# Patient Record
Sex: Female | Born: 1965 | Race: Black or African American | Hispanic: No | Smoking: Never smoker
Health system: Southern US, Community
[De-identification: ages and names within clinical notes are randomized; demographics above are authoritative.]

## PROBLEM LIST (undated history)

## (undated) DIAGNOSIS — R519 Headache, unspecified: Secondary | ICD-10-CM

## (undated) DIAGNOSIS — G43909 Migraine, unspecified, not intractable, without status migrainosus: Secondary | ICD-10-CM

## (undated) DIAGNOSIS — Z923 Personal history of irradiation: Secondary | ICD-10-CM

## (undated) DIAGNOSIS — Z803 Family history of malignant neoplasm of breast: Secondary | ICD-10-CM

## (undated) DIAGNOSIS — N809 Endometriosis, unspecified: Secondary | ICD-10-CM

## (undated) DIAGNOSIS — R51 Headache: Secondary | ICD-10-CM

## (undated) DIAGNOSIS — Z9221 Personal history of antineoplastic chemotherapy: Secondary | ICD-10-CM

## (undated) DIAGNOSIS — Z8042 Family history of malignant neoplasm of prostate: Secondary | ICD-10-CM

## (undated) DIAGNOSIS — C801 Malignant (primary) neoplasm, unspecified: Secondary | ICD-10-CM

## (undated) DIAGNOSIS — K5909 Other constipation: Secondary | ICD-10-CM

## (undated) HISTORY — PX: TONSILLECTOMY: SUR1361

## (undated) HISTORY — DX: Other constipation: K59.09

## (undated) HISTORY — DX: Endometriosis, unspecified: N80.9

## (undated) HISTORY — DX: Migraine, unspecified, not intractable, without status migrainosus: G43.909

## (undated) HISTORY — DX: Family history of malignant neoplasm of breast: Z80.3

## (undated) HISTORY — DX: Family history of malignant neoplasm of prostate: Z80.42

## (undated) HISTORY — PX: LAPAROSCOPIC SALPINGO OOPHERECTOMY: SHX5927

---

## 1997-12-21 ENCOUNTER — Other Ambulatory Visit: Admission: RE | Admit: 1997-12-21 | Discharge: 1997-12-21 | Payer: Self-pay | Admitting: Internal Medicine

## 1999-08-23 ENCOUNTER — Encounter: Payer: Self-pay | Admitting: Obstetrics and Gynecology

## 1999-08-23 ENCOUNTER — Ambulatory Visit (HOSPITAL_COMMUNITY): Admission: RE | Admit: 1999-08-23 | Discharge: 1999-08-23 | Payer: Self-pay | Admitting: Obstetrics and Gynecology

## 2001-09-01 ENCOUNTER — Encounter: Payer: Self-pay | Admitting: Obstetrics and Gynecology

## 2001-09-01 ENCOUNTER — Ambulatory Visit (HOSPITAL_COMMUNITY): Admission: RE | Admit: 2001-09-01 | Discharge: 2001-09-01 | Payer: Self-pay | Admitting: Obstetrics and Gynecology

## 2001-10-07 ENCOUNTER — Encounter: Payer: Self-pay | Admitting: Obstetrics and Gynecology

## 2001-10-07 ENCOUNTER — Observation Stay (HOSPITAL_COMMUNITY): Admission: RE | Admit: 2001-10-07 | Discharge: 2001-10-08 | Payer: Self-pay | Admitting: Obstetrics and Gynecology

## 2001-11-24 ENCOUNTER — Encounter: Payer: Self-pay | Admitting: Obstetrics and Gynecology

## 2001-11-24 ENCOUNTER — Ambulatory Visit (HOSPITAL_COMMUNITY): Admission: RE | Admit: 2001-11-24 | Discharge: 2001-11-24 | Payer: Self-pay | Admitting: Obstetrics and Gynecology

## 2001-11-24 ENCOUNTER — Observation Stay (HOSPITAL_COMMUNITY): Admission: AD | Admit: 2001-11-24 | Discharge: 2001-11-25 | Payer: Self-pay | Admitting: Obstetrics and Gynecology

## 2001-11-30 ENCOUNTER — Observation Stay (HOSPITAL_COMMUNITY): Admission: AD | Admit: 2001-11-30 | Discharge: 2001-12-01 | Payer: Self-pay | Admitting: Obstetrics and Gynecology

## 2001-12-04 ENCOUNTER — Inpatient Hospital Stay (HOSPITAL_COMMUNITY): Admission: AD | Admit: 2001-12-04 | Discharge: 2001-12-06 | Payer: Self-pay | Admitting: Obstetrics and Gynecology

## 2001-12-04 ENCOUNTER — Encounter: Payer: Self-pay | Admitting: Obstetrics and Gynecology

## 2001-12-05 ENCOUNTER — Encounter: Payer: Self-pay | Admitting: Obstetrics and Gynecology

## 2001-12-28 ENCOUNTER — Inpatient Hospital Stay (HOSPITAL_COMMUNITY): Admission: AD | Admit: 2001-12-28 | Discharge: 2002-01-01 | Payer: Self-pay | Admitting: Obstetrics and Gynecology

## 2001-12-28 ENCOUNTER — Encounter (INDEPENDENT_AMBULATORY_CARE_PROVIDER_SITE_OTHER): Payer: Self-pay

## 2002-02-07 ENCOUNTER — Other Ambulatory Visit: Admission: RE | Admit: 2002-02-07 | Discharge: 2002-02-07 | Payer: Self-pay | Admitting: Obstetrics and Gynecology

## 2002-03-11 ENCOUNTER — Encounter: Admission: RE | Admit: 2002-03-11 | Discharge: 2002-03-11 | Payer: Self-pay | Admitting: Internal Medicine

## 2002-03-11 ENCOUNTER — Encounter: Payer: Self-pay | Admitting: Internal Medicine

## 2002-07-06 ENCOUNTER — Encounter: Payer: Self-pay | Admitting: Emergency Medicine

## 2002-07-06 ENCOUNTER — Emergency Department (HOSPITAL_COMMUNITY): Admission: EM | Admit: 2002-07-06 | Discharge: 2002-07-06 | Payer: Self-pay | Admitting: Emergency Medicine

## 2003-04-05 ENCOUNTER — Other Ambulatory Visit: Admission: RE | Admit: 2003-04-05 | Discharge: 2003-04-05 | Payer: Self-pay | Admitting: Obstetrics and Gynecology

## 2004-05-20 ENCOUNTER — Other Ambulatory Visit: Admission: RE | Admit: 2004-05-20 | Discharge: 2004-05-20 | Payer: Self-pay | Admitting: Obstetrics and Gynecology

## 2005-05-21 ENCOUNTER — Other Ambulatory Visit: Admission: RE | Admit: 2005-05-21 | Discharge: 2005-05-21 | Payer: Self-pay | Admitting: Obstetrics and Gynecology

## 2005-06-13 ENCOUNTER — Ambulatory Visit (HOSPITAL_COMMUNITY): Admission: RE | Admit: 2005-06-13 | Discharge: 2005-06-13 | Payer: Self-pay | Admitting: Obstetrics and Gynecology

## 2005-07-11 ENCOUNTER — Emergency Department (HOSPITAL_COMMUNITY): Admission: EM | Admit: 2005-07-11 | Discharge: 2005-07-11 | Payer: Self-pay | Admitting: Emergency Medicine

## 2006-06-15 ENCOUNTER — Ambulatory Visit (HOSPITAL_COMMUNITY): Admission: RE | Admit: 2006-06-15 | Discharge: 2006-06-15 | Payer: Self-pay | Admitting: Obstetrics and Gynecology

## 2006-06-26 ENCOUNTER — Encounter: Admission: RE | Admit: 2006-06-26 | Discharge: 2006-06-26 | Payer: Self-pay | Admitting: Obstetrics and Gynecology

## 2006-11-27 ENCOUNTER — Encounter: Admission: RE | Admit: 2006-11-27 | Discharge: 2006-11-27 | Payer: Self-pay | Admitting: Internal Medicine

## 2007-10-14 ENCOUNTER — Ambulatory Visit (HOSPITAL_COMMUNITY): Admission: RE | Admit: 2007-10-14 | Discharge: 2007-10-14 | Payer: Self-pay | Admitting: Obstetrics and Gynecology

## 2010-06-23 ENCOUNTER — Encounter: Payer: Self-pay | Admitting: Obstetrics and Gynecology

## 2010-06-28 ENCOUNTER — Other Ambulatory Visit (HOSPITAL_COMMUNITY): Payer: Self-pay | Admitting: Obstetrics and Gynecology

## 2010-06-28 DIAGNOSIS — Z139 Encounter for screening, unspecified: Secondary | ICD-10-CM

## 2010-06-28 DIAGNOSIS — Z1231 Encounter for screening mammogram for malignant neoplasm of breast: Secondary | ICD-10-CM

## 2010-07-19 ENCOUNTER — Ambulatory Visit (HOSPITAL_COMMUNITY): Admission: RE | Admit: 2010-07-19 | Payer: 59 | Source: Ambulatory Visit

## 2010-07-23 ENCOUNTER — Encounter (HOSPITAL_COMMUNITY): Payer: Self-pay

## 2010-07-23 ENCOUNTER — Ambulatory Visit (HOSPITAL_COMMUNITY)
Admission: RE | Admit: 2010-07-23 | Discharge: 2010-07-23 | Disposition: A | Discharge status: Home / Self Care | Payer: 59 | Source: Ambulatory Visit | Attending: Obstetrics and Gynecology | Admitting: Obstetrics and Gynecology

## 2010-07-23 DIAGNOSIS — Z1231 Encounter for screening mammogram for malignant neoplasm of breast: Secondary | ICD-10-CM | POA: Insufficient documentation

## 2010-10-18 ENCOUNTER — Ambulatory Visit
Admission: RE | Admit: 2010-10-18 | Discharge: 2010-10-18 | Disposition: A | Discharge status: Home / Self Care | Payer: 59 | Source: Ambulatory Visit | Attending: Internal Medicine | Admitting: Internal Medicine

## 2010-10-18 ENCOUNTER — Other Ambulatory Visit: Payer: Self-pay | Admitting: Internal Medicine

## 2010-10-18 DIAGNOSIS — R05 Cough: Secondary | ICD-10-CM

## 2010-10-18 DIAGNOSIS — R059 Cough, unspecified: Secondary | ICD-10-CM

## 2010-10-18 NOTE — Discharge Summary (Signed)
[facility]  Ashley Tyler:    Ashley Tyler, Ashley Tyler Visit Number: [id number] MRN: [id number]          Service Type: OBS Location: 910B 9159 01 Attending Physician:  Esmeralda Arthur Dictated by:   Wynelle Bourgeois, CNM Admit Date:  11/24/2001 Discharge Date: 11/25/2001                             Discharge Summary  ADMISSION DIAGNOSES:              1. Intrauterine pregnancy at 30-5/7 weeks.                                   2. Hyperemesis with sudden onset of nausea                                      and vomiting.                                   3. A 3-1/2 pound weight loss in 2 days.                                   4. Dehydration with ketonuria.                                   5. Shortness of breath and tachycardia.  DISCHARGE DIAGNOSES:              1. Intrauterine pregnancy at 30-5/7 weeks.                                   2. Hyperemesis with sudden onset of nausea                                      and vomiting.                                   3. A 3-1/2 pound weight loss in 2 days.                                   4. Dehydration with ketonuria.                                   5. Shortness of breath and tachycardia.  HOSPITAL PROCEDURES:              1. IV hydration.                                   2. Echocardiogram.  HOSPITAL COURSE:  The Ashley Tyler was admitted per Dr. Estanislado Pandy for hydration and antiemetic medication secondary to sudden onset nausea and vomiting.  She had also been complaining of dizziness which had also been a complaint earlier in the pregnancy and an echocardiogram was performed which was interpreted by the cardiologist as being within normal limits.  The Ashley Tyler was given IV hydration and Zofran for which she had improvement in her nausea and was able to eat and drink fluids.  She continued to have dizziness despite initial hydration and orthostatic blood pressures were ordered. Orthostatic  blood pressure measurements were reassuring and not significantly different from supine to standing.  Her dizziness had improved with further rehydration and she was able to keep down p.o. fluids and food.  A Holter monitor was obtained for assessment of further cardiac status after discharge and she was deemed to have received the full benefit of her hospital stay and was discharged home by Dr. Pennie Rushing with plans for followup with Dr. Fraser Din and she was also given some Zofran for nausea.  DISCHARGE MEDICATION:             Zofran 8 mg q.8-12h. p.r.n. nausea.  DISCHARGE LABORATORIES:           White blood cell count 7.2, hemoglobin 11.1, platelets 173.  Chemistries within normal limits.  Liver function tests within normal limits.  Urine ketones negative.  Specific gravity 1.020.  TSH 1.105, free T4 0.91, T3 179.8.  DISCHARGE FOLLOWUP:               Follow up with Dr. Fraser Din for event monitor (Holter).  DISCHARGE INSTRUCTIONS:           Follow up with Dr. Fraser Din for cardiology issues, level 2 bed rest at home, and follow up in office p.r.n. Dictated by:   Wynelle Bourgeois, CNM Attending Physician:  Esmeralda Arthur DD:  11/25/01 TD:  11/28/01 Job: [id number]

## 2010-10-18 NOTE — H&P (Signed)
Ashley Tyler, Ashley Tyler                  ACCOUNT NO.:  [id number]   MEDICAL RECORD NO.:  [id number]                   PATIENT TYPE:  INP   LOCATION:  9126                                 FACILITY:  [facility]   PHYSICIAN:  Vicki L. Emilee Hero, C.N.M.             DATE OF BIRTH:  1965/11/02   DATE OF ADMISSION:  12/28/2001  DATE OF DISCHARGE:                                HISTORY & PHYSICAL   HISTORY OF PRESENT ILLNESS:  The patient is a 45 year old gravida 2, para 1-  0-0-1, at 35-4/7 weeks, who presents with heavy bleeding with a known  placenta previa.  She reports onset of the bleeding approximately 20 minutes  prior to admission.  Since the bleeding has begun, she began to have some  cramping.  Her pregnancy had been remarkable for:  #1 - Persistent placenta  previa; #2 - advanced maternal age with no amniocentesis; #3 - history of  endometriosis; #4 - history of right salpingo-oophorectomy; #5 - history of  hyperemesis; #6 - three bleeding episodes during this pregnancy, #7 -  echogenic endocardiac focus on 18-week ultrasound.   PRENATAL LABORATORY DATA:  Blood type is O-positive, Rh-antibody negative,  VDRL nonreactive, rubella titer positive, hepatitis B surface antigen  negative, HIV negative.  GC and Chlamydia cultures were negative.  Pap was  normal May of 2002.  Glucose challenge was normal.  AFP and amniocentesis  were declined.  Sickle cell test is negative from previous pregnancy.  Hemoglobin upon entry into practice is 13.2; it was 13.2 at 28 weeks.  EDC  of January 28, 2002 was established by ultrasound secondary to uncertain LMP;  this also agreed with later ultrasounds.   HISTORY OF PRESENT PREGNANCY:  The patient entered care at approximately 10  weeks.  She was on Phenergan and Reglan for nausea and vomiting.  She was  placed on the Medrol taper and did begin to feel somewhat better.  She  declined amniocentesis.  She had an ultrasound at 18 weeks that showed  placenta previa and echogenic endocardiac focus.  The patient did have some  sporadic glycosuria showing normal blood sugars.  She elected she wanted to  have a tubal ligation.  She did have some history of palpitations during her  pregnancy; cardiology consult was accomplished.  She had an echocardiogram  scheduled for November 25, 2001.  She still had some nausea and vomiting  throughout the rest of her pregnancy.  She had a total of three bleeding  episodes during her pregnancy; she was seen in the hospital on November 30, 2001  for cramping and then on December 04, 2001 and December 05, 2001 for bleeding previa.  She had an ultrasound at 30 and 5/7ths weeks which showed normal growth.  Amniocentesis was offered at 34 weeks for maturity; she declined  amniocentesis.  She began having NSTs twice weekly secondary to two to three  bleeding episodes.  She was scheduled for C-section and tubal ligation  on  January 05, 2002.  Her NSTs remained reactive.  On December 28, 2001, she called  with heavy bleeding episode and was directed to go to the hospital  immediately.   OBSTETRICAL HISTORY:  In 1995, she had a vaginal birth of a female infant,  weight 7 pounds 2 ounces, at 20 weeks' gestation; she was in labor five  hours; she had epidural anesthesia; she was induced secondary to bleeding  during that pregnancy.  Baby did have some jaundice.   MEDICAL HISTORY:  She has used contraceptives six years ago.  She has a  history of endometriosis, with her right ovary and tube removed in 1989.  She had a HSG in 1994 which showed clear tubes.  She has occasional yeast  infections.  She reports the usual childhood illnesses.  She has infrequent  UTIs.  She did have pyelonephritis once in college which responded to  outpatient treatment.   SURGICAL HISTORY:  Surgical history includes a right salpingo-oophorectomy  in 1989; HSG in 1994; at age 72, had her tonsils out; age 66, had her wisdom  teeth.   GENETIC HISTORY:   Genetic history is remarkable for the patient being 36 at  the time of delivery and father of the baby's sister with sickle cell trait.   FAMILY HISTORY:  Her father has left ventricular hypertrophy.  Her father  and mother have hypertension.  Her maternal grandmother has hypothyroidism.  Her maternal cousin has lupus.  Maternal grandfather had lung cancer.  Maternal father has migraine headaches.  Mother grandmother had Alzheimer's.   SOCIAL HISTORY:  The patient is married to the father of the baby; he is  involved and supportive; his name is Francesca Oman.  The patient is Patent examiner.  She has a college education and is employed at [employer]  as a Designer, jewellery; her husband also has a Naval architect and he is  employed a Aeronautical engineer station.   ALLERGIES:  She is allergic to TETRACYCLINE.   OBSTETRICAL HISTORY:  She has been followed by the physicians' service at  [facility].  She denies any alcohol, drug or tobacco use during  this pregnancy.   PHYSICAL EXAMINATION:  VITAL SIGNS:  Stable.  The patient is afebrile.   HEENT:  Within normal limits.   LUNGS:  Bilateral breath sounds are clear.   HEART:  Regular rate and rhythm without murmur.   BREASTS:  Soft and nontender.   ABDOMEN:  Fundal height is approximately 35 cm.  Estimated fetal weight is 5  to 6 pounds.  Uterine contractions are every five minute, mild-to-moderate  quality.  Abdomen is soft and nontender between contractions.   PELVIC:  Visual exam of the perineum shows a moderate amount of bright red  bleeding with several small clots passed.  Fetal heart rate is reactive with  no decelerations.   EXTREMITIES:  Deep tendon reflexes are 2+ without clonus.  There is a trace  edema noted.   IMPRESSION:  1. Intrauterine pregnancy at 35-4/7 weeks.  2. Known placenta previa with heavy bleeding, now episode #4.   PLAN: 1. Admit to the [facility] per consult with Dr. Pierre Bali. Dillard  as     attending physician.  2. Dr. Normand Sloop will come see the patient in Maternity Admissions Unit and     probably perform a speculum exam.  3. Will possibly anticipate C-section delivery today secondary to heavy     bleeding episode at 35-4/7  weeks.  4. Support offered to the patient and her family for their concern regarding     this situation.                                                 Renaldo Reel Emilee Hero, C.N.M.    VLL/MEDQ  D:  12/30/2001  T:  12/31/2001  Job:  [id number]

## 2010-10-18 NOTE — H&P (Signed)
[facility]  Patient:    Ashley Tyler, Ashley Tyler Visit Number: [id number] MRN: [id number]          Service Type: OBS Location: 910D 9126 01 Attending Physician:  Shaune Spittle Dictated by:   Nigel Bridgeman, C.N.M. Admit Date:  11/30/2001                           History and Physical  HISTORY:                      Ashley Tyler is a 45 year old gravida 2, para 1-0-0-1, at 31-4/7th weeks with a known placenta previa, who is admitted today, status post a bleeding episode.  The patient reports she woke up this morning and passed a 5.0 to 6.0 cm clot, and has continued to have a small amount of bright red bleeding subsequently.  She reports back pain last night, but no significant abdominal pain or back pain today.  She has been on pelvic rest since the diagnosis of her previa at approximately 18-19 weeks.  The patient was also hospitalized last week for an episode of nausea and vomiting, and ketonuria.  The patient was hospitalized at approximately 24 weeks for a previous bleeding episode with the previa.  The pregnancy has been remarkable for a complete placenta previa, a history of a right salpingo-oophorectomy, advanced maternal age with amnio decline, a history of tachycardia and mild shortness of breath which was evaluated with a cardiology consultation and was found to be benign.  PRENATAL LABORATORY DATA:     Blood type O-positive, Rh antibody negative, VDRL nonreactive, rubella titer positive, hepatitis-B surface antigen negative, HIV nonreactive.  Pap smear was within normal limits.  GC and Chlamydia cultures were negative.  AFP was declined.  A glucose challenge was normal.  Hemoglobin upon entering the practice was 13.2, platelets 288,000.  HISTORY OF PRESENT PREGNANCY: The patient entered care at approximately eight weeks gestation.  She was prescribed a Z-Pak from [place] for a sinus infection at that time.  She has continued  to have some sporadic nausea and vomiting during the pregnancy.  In early pregnancy she used Phenergan, Reglan, and Medrol.  As of last week, she had an experience of losing 3-1/2 pounds, secondary to the GI virus, but she has resolved that.  She did have a cardiology consultation on November 17, 2001, by Dr. Meade Maw, secondary to dyspnea with palpitations.  The patient last week was discharged home with a Holter monitor.  The findings were benign.  She had a previous overnight hospitalization at 24 weeks with an episode of bleeding with her previa.  She also had received betamethasone during that hospitalization.  OBSTETRICAL HISTORY:          In 1995, she had a vaginal birth of a female infant at 53 weeks who weighed 7 pounds 2 ounces.  PAST MEDICAL HISTORY:         Remarkable for the previous history of endometriosis, history of right salpingo-oophorectomy, and questionable gastric reflux.  FAMILY HISTORY:               Remarkable for the patients father with left ventricular hypertrophy.  The patients mother and father have hypertension. Maternal grandmother has hypothyroid disease.  A maternal cousin had lupus. Paternal grandfather has lung cancer.  The patients father has migraines. Maternal grandmother has Alzheimers disease.  PAST SURGICAL HISTORY:  Removal of the patients tonsils and wisdom teeth in the past.  SOCIAL HISTORY:               The patient is married.  The father of the baby is involved and supportive.  His name is Mr. Francesca Oman.  The patient is African-American, of the [place] faith.  She has been followed by [facility] [place].  She is employed with [employer].  She denies any alcohol, drug, or tobacco use during this pregnancy.  PHYSICAL EXAMINATION  VITAL SIGNS:                  Stable.  The patient is afebrile.  HEENT:                        Within normal limits.  LUNGS:                        Bilateral  breath sounds are clear.  HEART:                        A regular rate and rhythm, without murmur.  BREASTS:                      Soft, nontender.  ABDOMEN:                      Fundal height is approximately 31.0 to 32.0 cm. The abdomen is soft and nontender.  PELVIC:                       A speculum examination shows the cervix to be long and closed visually.  There is a small amount of bright red bleeding noted in the vaginal vault, but there is no obvious active bleeding noted.  LABORATORY DATA:              CBC is within normal limits.  Comprehensive metabolic panel is normal.  The fetal heart rate is reassuring with sporadic accelerations.  There are occasional mild variable decelerations, generally immediately following a broad acceleration.  There are some mild episodes of irritability noted.  IMPRESSION:                   1. Intrauterine pregnancy at 31-4/7th weeks.                               2. Placenta previa with a third trimester                               bleeding episode.  PLAN:                         1. Admit to the [facility]                               per consultation with Dr. Maris Berger. Haygood                               as the attending physician.  2. Routine physician orders.                               3. Plan a betamethasone course again,                               secondary to remoteness of last dosing.                               4. Close observation of maternal, fetal status. Dictated by:   Nigel Bridgeman, C.N.M. Attending Physician:  Shaune Spittle DD:  11/30/01 TD:  11/30/01 Job: [id number]

## 2010-10-18 NOTE — H&P (Signed)
[facility]  Patient:    Ashley Tyler, Ashley Tyler Visit Number: [id number] MRN: [id number]          Service Type: OBS Location: 910B 9159 01 Attending Physician:  Esmeralda Arthur Dictated by:   Mack Guise, C.N.M. Admit Date:  11/24/2001                           History and Physical  HISTORY OF PRESENT ILLNESS:   The patient is a 45 year old gravida 2 para 1-0-0-1 who presents from the office of [facility] at 30 and five-sevenths weeks for admission secondary to increasing symptoms of shortness of breath, heart palpitations, and sudden onset of nausea and vomiting which began Monday, including a 3.5 pound weight loss in two days.  She is found to be dehydrated with ketonuria.  Her pregnancy is significant for complete placenta previa diagnosed at 18 weeks with one episode of bleeding.  The patient has been maintained on bedrest with no further bleeding.  She had a cardiology consult for her symptoms of shortness of breath and heart palpitations on November 17, 2001 by Dr. Meade Maw.  Her pregnancy has been followed by the M.D. service at [facility] and is remarkable for: 1. A history of endometriosis. 2. History of right salpingo-oophorectomy. 3. Hyperemesis. 4. Advanced maternal age. 5. Complete placenta previa. 6. Shortness of breath and tachycardia.  HISTORY OF PREGNANCY:         Her pregnancy was initially evaluated at the office of [facility] on June 21, 2001 at approximately eight weeks gestation.  At that time she was noticed to have a sinus infection and was prescribed a Z pack.  She recovered quickly from this but has complained of nausea and vomiting from the beginning of pregnancy.  Phenergan and Reglan were ineffective to control her nausea and vomiting and she began a Medrol protocol at 10 weeks.  She has been using Phenergan suppositories since that time with good effect until Monday.  She gained weight appropriately throughout  her pregnancy, gaining 18 pounds until 23 weeks, but she has recently lost 3.5 pounds in the past two days.  Cardiology consult was completed on November 17, 2001 by Dr. Meade Maw with the impression of dyspnea with palpitations. Her cardiac exam at that time was normal and she was scheduled for a transesophageal echocardiogram to evaluate a possible cardiomyopathy and a plan to obtain a Holter monitor to evaluate her palpitations.  The echocardiogram will be accomplished with this admission.  OBSTETRICAL HISTORY:          In 1995 she had a normal spontaneous vaginal delivery at 36 weeks with the birth of a 7 pound 2 ounce female infant named [place].  MEDICAL HISTORY:              Significant for endometriosis, history of right salpingo-oophorectomy, questionable gastric reflux.  FAMILY HISTORY:               The patients father with left ventricular hypertrophy.  The patients father and mother with hypertension.  Maternal grandmother with hypothyroid disease.  A maternal cousin with lupus.  Maternal grandfather with lung cancer.  The patients father with migraines.  A maternal grandmother with Alzheimers disease.  SURGICAL HISTORY:             Tonsils and wisdom teeth.  GENETIC HISTORY:              The patient is advanced maternal  age at 52 years old and father-of-the-babys sister is positive sickle cell trait.  SOCIAL HISTORY:               The patient is a married African-American female.  Her husband, Francesca Oman, is involved and supportive.  ALLERGIES:                    TETRACYCLINE.  HABITS:                       Denies the use of tobacco, alcohol, or illicit drugs.  PRENATAL LABORATORY DATA:     On July 07, 2001:  Hemoglobin and hematocrit 13.2 and 39.1; platelets 288,000.  Blood type and Rh O positive, antibody screen negative.  VDRL nonreactive.  Rubella immune.  Hepatitis B surface antigen negative.  HIV negative.  Pap smear within normal limits.  GC  and chlamydia negative.  AFP/free beta hCG declined.  Amniocentesis declined.  At 28 weeks one-hour glucose challenge 117.  REVIEW OF SYSTEMS:            Are as described above.  The patient is typical of one with a uterine pregnancy at 30 and five-sevenths weeks with dehydration and ketonuria, sudden onset of nausea and vomiting which began Monday, and continuation of symptoms of shortness of breath and heart palpitations.  PHYSICAL EXAMINATION:  VITAL SIGNS:                  Stable, afebrile.  HEART:                        Regular rate and rhythm.  LUNGS:                        Clear.  No shortness of breath at rest.  GENERAL:                      Alert and oriented x3.  HEENT:                        Unremarkable.  Thyroid not enlarged.  ABDOMEN:                      Soft and nontender, gravid in its contour. Uterine fundus is noted to extend 30 cm above the level of the pubic symphysis.  The fetal heart rate is reassuring.  The patient is not having any contractions on tocodynamometer.  EXTREMITIES:                  Show no pathology edema.  DTRs are 1+ with no clonus.  ASSESSMENT:                   1. Intrauterine pregnancy at 30 and                                  five-sevenths weeks.                               2. Hyperemesis with sudden onset of nausea and  vomiting on Monday.                               3. A 3.5 pound weight loss in two days.                               4. Dehydration with ketonuria.                               5. Shortness of breath and tachycardia.  PLAN:                         Admit per Dr. Dois Davenport Rivard.  IV D5-LR at 200 cc/hour for the first liter.  IV LR with one amp of MVI at 125 cc/hour for her second bag of IV fluid.  Antiemetics were prescribed - Phenergan 25 mg IV q.6h. p.r.n. nausea and vomiting or Zofran 4 mg IV q.8h. p.r.n. nausea and vomiting.  UA for ketones after second bag of IV fluids.   Blood work to include CBC with diff, CMET, TSH, T4 and T3, and cardiac echocardiogram. Dictated by:   Mack Guise, C.N.M.  Attending Physician:  Esmeralda Arthur DD:  11/24/01 TD:  11/24/01 Job: [id number]

## 2010-10-18 NOTE — Discharge Summary (Signed)
Ashley Tyler, Ashley Tyler                  ACCOUNT NO.:  [id number]   MEDICAL RECORD NO.:  [id number]                   PATIENT TYPE:  INP   LOCATION:  9126                                 FACILITY:  [facility]   PHYSICIAN:  Vicki L. Emilee Hero, C.N.M.             DATE OF BIRTH:  02-16-1966   DATE OF ADMISSION:  12/28/2001  DATE OF DISCHARGE:  01/01/2002                                 DISCHARGE SUMMARY   ADMISSION DIAGNOSES:  1. Intrauterine pregnancy at 35-4/7 weeks.  2. Bleeding.  Placenta previa.  3. Desires tubal sterilization.   DISCHARGE DIAGNOSES:  1. Intrauterine pregnancy at 35-4/7 weeks.  2. Bleeding.  Placenta previa.  3. Desires tubal sterilization.   PROCEDURE:  1. Primary low transverse cesarean section.  2. Tubal ligation of the left tube and ovary, secondary to surgical absence     of the right tube and ovary.   HOSPITAL COURSE:  Ashley Tyler is a 45 year old gravida 2, para 1, 0-0-  1, at 35-4/7 weeks who had a known previa who presented with a heavy  bleeding episode on December 28, 2001.  She then scheduled for C-section for  August 6.  The pregnancy had been remarkable for:  (1) advanced maternal age  with amnio decline, (2) placenta previa with three previous bleeding  episodes, (3) desires tubal sterilization, (4) history of removal of right  ovary and tube, (5) echogenic intracardiac focus on ultrasound, and (6)  occasional palpitations, cardiac consult negative.  Vital signs were stable  on presentation.  Her hemoglobin was 11.9 on admission.  The patient was  having a moderate to heavy amount of bright red bleeding with some  contractions every five minutes.  The decision was made to proceed with  cesarean section.  The patient was taken to the operating room where a  primary low transverse cesarean section was performed with delivery of a  viable female, weight 6 lb., 15 oz.  Apgar's were 5 and 7.  The fetus was in  a transverse with back-down position.   There was a large venous sinus noted  on the uterus following delivery.  The left tube and ovary were ligated in  the usual fashion.  The right tube and ovary were noted to be surgically  absent.  The patient was taken to the recovery room in good condition.  The  infant initially went to full-term nursery but then subsequently was  admitted to NICU for low platelets and sepsis workup.  By postop day 1, the  patient's hemoglobin was 10.6, down from 11.6.  Her physical exam was within  normal limits.  Her incision was clean, dry, and intact.  On postop day 2,  the patient did have some issues with constipation.  She received a Dulcolax  suppository, which did give her good results.  Antiplatelet antibodies were  done, which were negative.  On postop day 3, the patient had requested to  remain another day, given the  baby's NICU status.  This was done also since  the patient had had a bleeding previa.  By postop day 4 patient had another  episode of constipation but did have positive bowel sounds.  She also had a  very slight amount of gaseous distention but was passing flatus.  A Dulcolax  suppository was given for patient comfort.  Her incision was clean, dry and  intact.  Her stables removed and Steri-strips were applied.  The rest of the  physical exam was within normal limits.  The infant was stable.  There was a  grade 1 ventricular bleed noted and infant was jaundiced.  Patient infant  had received blood transfusion and platelet transfusion but was stable.   Patient was deemed to have received full benefit of hospital stay and was  discharged home.   DISCHARGE INSTRUCTIONS:  Presented [facility] handout.   DISCHARGE MEDICATIONS:  Motrin 600 mg p.o. q. 6h p.r.n. pain, Tylox 1-2 p.o.  q. 4h p.r.n. pain.   DISCHARGE FOLLOW UP:  In six weeks at __________ [facility]                                               Renaldo Reel Emilee Hero, C.N.M.    VLL/MEDQ  D:  01/01/2002  T:   01/07/2002  Job:  [id number]

## 2010-10-18 NOTE — Discharge Summary (Signed)
[facility]  Patient:    Ashley Tyler, Ashley Tyler Visit Number: [id number] MRN: [id number]          Service Type: OBS Location: 910D 9126 01 Attending Physician:  Shaune Spittle Dictated by:   Mack Guise, C.N.M. Admit Date:  11/30/2001 Discharge Date: 12/01/2001                             Discharge Summary  ADMITTING DIAGNOSES:              1. Intrauterine pregnancy at 31-4/7 weeks.                                   2. Placenta previa with bleeding.  DISCHARGE DIAGNOSES:              1. Intrauterine pregnancy at 31-4/7 weeks.                                   2. Placenta previa with bleeding stable.  HISTORY AND HOSPITAL COURSE:      The patient is a 45 year old, gravida 2, para 1-0-0-1 at 31-4/7 weeks who was admitted with a second episode of bright red bleeding secondary to placenta previa.  On admission bleeding had slowed down and the patient has had no further bleeding in the 24 hours since she has been admitted.  She has had some old brown discharge but no active bleeding. Her baby has been reactive and reassuring, she is not contracting at all, she received two doses of betamethasone 12.5 mg 24 hours apart and as she was having no active bleeding, she was discharged home and on bed rest and to return to the office in 1 week. Dictated by:   Mack Guise, C.N.M. Attending Physician:  Shaune Spittle DD:  12/01/01 TD:  12/04/01 Job: [id number]

## 2010-10-18 NOTE — Op Note (Signed)
Ashley Tyler, Ashley Tyler                  ACCOUNT NO.:  [id number]   MEDICAL RECORD NO.:  [id number]                   PATIENT TYPE:  INP   LOCATION:  9126                                 FACILITY:  [facility]   PHYSICIAN:  Naima A. Dillard, M.D.              DATE OF BIRTH:  [birth year]   DATE OF PROCEDURE:  12/28/2001  DATE OF DISCHARGE:                                 OPERATIVE REPORT   PREOPERATIVE DIAGNOSES:  Pregnancy at 35-4/7 weeks with known placenta  previa and heavy vaginal bleeding with premature contractions and  multiparity.   POSTOPERATIVE DIAGNOSES:  Pregnancy at 35-4/7 weeks with known placenta  previa and heavy vaginal bleeding with premature contractions and  multiparity.   PROCEDURE:  Primary low transverse cesarean section and left tubal ligation.   SURGEON:  Naima A. Normand Sloop, M.D.   ASSISTANT:  Renaldo Reel. Latham, C.N.M.   ESTIMATED BLOOD LOSS:  1500 cc.   IV FLUIDS:  2000 cc.   URINE OUTPUT:  450 cc of clear urine.   ANESTHESIA:  Spinal.   FINDINGS:  Female infant in vertex presentation with the back down, clear  fluid, Apgars 5 and 7, cord pH 7.10.  There were large venous sinuses on the  uterus and normal appearing left tube and ovary with some mild adhesions.  The right tube and ovary were surgically absent.   COMPLICATIONS:  None.  The patient went to the recovery room in stable  condition.   DESCRIPTION OF PROCEDURE:  The patient was taken to the operating room where  her spinal anesthesia was found to be adequate. She was placed in the dorsal  supine position with a left lateral tilt.  Her Foley catheter had already  been placed.  The patient was prepped and draped in the normal sterile  fashion.  A Pfannenstiel skin incision was then made 2 cm above the  symphysis pubis and carried down to the fascia.  Using cautery, the fascia  was incised in the midline and extended bilaterally.  Kochers x2 were placed  on the superior aspect of the fascia  which was dissected off of the rectus  muscles both sharply with Mayo scissors and bluntly.  The attention was then  turned to the lower aspect of the fascia which was dissected off the rectus  muscle in a similar fashion.  The rectus muscle was then separated in the  midline.  The peritoneum was identified, tented up, and entered sharply with  Metzenbaum scissors.  The peritoneal incision was extended superiorly and  inferiorly with good visualization of bowel and bladder.  The bladder blade  was then placed.  The vesicouterine peritoneum was identified, tented up,  and entered sharply with Metzenbaum scissors.  The bladder blade was then  replaced and primary low transverse uterine incision was then made with the  scalpel and extended bilaterally using bandage scissors.  Part of the  placenta was visible.  The amniotic sac was then  ruptured.  Clear fluid was  noted.  The infant was found to be in transverse presentation.  We tried to  evert the infant to vertex, however, this was found to be difficult to move,  a foot was then coming out of the incision.  The decision was made at this  time to deliver doing breech maneuver, so both feet were grasped with a  moist towel.  The infant was delivered easily with breech maneuvers without  difficulty.  There was no nuchal cord and no meconium.  The placenta was  then delivered without difficulty.  The uterus was cleared of all clot and  debris.  The uterine incision was repaired with 0 Vicryl in a running locked  fashion.  The second layer was then used to obtain hemostasis.  The  patient's left fallopian tube was then grasped with Babcock clamp.  There  were some adhesions along the tube to the serosa of the small bowel which  were lysed both bluntly and sharply.  The left fallopian tube was  identified, grasped with a Babcock clamp and the midisthmic portion of the  tube was ligated with 2-0 plain and excised about 1 cm.  Hemostasis was   assured.  However, along the broad ligament, there was some oozing which was  made hemostatic using Bovie cautery.  Along the broad ligament, there was  some adhesions with the tube.  Hemostasis was assured.  The abdomen was  irrigated with copious saline.  Hemostasis was assured.  The right ovary and  tube were found to be surgically absent. The rectus muscle was  reapproximated using 0 Vicryl.  The rectus muscle was seen to be hemostatic.  The fascia was closed with 0 Vicryl in a running fashion.  The subcutaneous  fascia was irrigated copiously with saline and any bleeding areas were made  hemostatic using Bovie cautery.  The skin was closed with staples.  Needle,  sponge, and instrument count correct x2.  The patient went to the recovery  room in stable condition.                                               Naima A. Normand Sloop, M.D.    NAD/MEDQ  D:  12/28/2001  T:  12/31/2001  Job:  [id number]

## 2010-10-18 NOTE — Discharge Summary (Signed)
[facility]  Patient:    Ashley Tyler, Ashley Tyler Visit Number: [id number] MRN: [id number]          Service Type: OBS Location: 910B 9152 01 Attending Physician:  Michael Litter Dictated by:   Wynelle Bourgeois, CNM Admit Date:  12/04/2001 Discharge Date: 12/06/2001                             Discharge Summary  ADMISSION DIAGNOSES:          1. Intrauterine pregnancy at 32-1/7 weeks.                               2. Placenta previa.                               3. Third trimester bleeding (third episode).                               4. Against medical advice.  DISCHARGE DIAGNOSES:          1. Intrauterine pregnancy at 32-1/7 weeks.                               2. Placenta previa.                               3. Third trimester bleeding (third episode).                               4. Against medical advice.                               5. Preterm uterine contractions.  HOSPITAL PROCEDURES:          1. Electronic fetal monitoring.                               2. Magnesium sulfate tocolysis.  HOSPITAL COURSE:              The patient was admitted with vaginal bleeding which covered a fourth of a pad.  She denied contractions at that moment and reported positive fetal movement.  She has a known placenta previa and was therefore admitted since this was her third bleeding episode.  She was placed on electronic fetal monitoring and her ultrasound was performed for followup of her amniotic fluid index and placental location.  During the next day, she complained of constant back pain, had spotting of old blood noted, fetal heart rate was reassuring.  _____ showed uterine irritability with several contractions.  Her hemoglobin on admission was 10.8.  Magnesium sulfate tocolysis was begun on hospital day #2.  The patient was expressing a desire to go home.  She states that she understands the risks of being at home given another episode of  bleeding, but agrees to assume those risks.  She had old brown discharge.  Lungs were clear.  Heart was regular rate and rhythm. Abdomen was gravid, soft, nontender.  No uterine contractions noted. Nonstress  test was reactive.  Magnesium sulfate was discontinued after a long discussion with Dr. Estanislado Pandy, and she was discharged after a two-hour observation period.  DISCHARGE MEDICATIONS:        Prenatal vitamins.  DISCHARGE LABORATORIES:       White blood cell count 8.4, hemoglobin 10.4, hematocrit 31.7, platelets 177.  DISCHARGE INSTRUCTIONS:       The patient will remain on complete bedrest. Her daughter will be cared for by a neighbor.  She will call 911 in the event of any vaginal bleeding.  DISCHARGE FOLLOWUP:           December 08, 2001, with Dr. Pennie Rushing or p.r.n. Dictated by:   Wynelle Bourgeois, CNM Attending Physician:  Michael Litter DD:  12/06/01 TD:  12/08/01 Job: [id number]

## 2010-10-18 NOTE — H&P (Signed)
[facility]  Patient:    Ashley Tyler, Ashley Tyler Visit Number: [id number] MRN: [id number]          Service Type: OBS Location: 910D 9122 01 Attending Physician:  Shaune Spittle Dictated by:   Nigel Bridgeman, C.N.M. Admit Date:  10/07/2001                           History and Physical  HISTORY OF PRESENT ILLNESS:   Ashley Tyler is a 45 year old gravida 2, para 1-0-0-1 at 24-1/7 weeks, with a known previa.  She presented with an episode of small amount of bright red bleeding last night, now resolved to a brown discharge.  She denies intercourse and cramping.  She reports positive fetal movement.  The patient had an ultrasound already scheduled at [facility] at 9 a.m. today.  She was brought in prior to that time for fetal monitoring and evaluation.  PREGNANCY RISK FACTORS:       The pregnancy has been remarkable for: 1. Placenta previa, diagnosed at 18 weeks. 2. Echogenic intracardiac focus on ultrasound at 18 weeks. 3. Advanced maternal age with amniocentesis declined.  The patient also    declined AST. 4. History of hyperemesis.  PRENATAL LABS:                Blood type 0 positive.  Rh antibody negative. VDRL nonreactive.  Rubella titer positive.  Hepatitis B surface antigen negative.  HIV nonreactive.  GC and chlamydia cultures negative.  Pap normal in May 2002.   AFP declined.  Hemoglobin upon entering the practice was 13.2, it has not yet been done at 27 weeks.  [facility] January 28, 2002 was established by ultrasound at approximately 18 weeks.  HISTORY OF PRESENT PREGNANCY: The patient entered care at approximately 10 weeks.  She did have significant nausea and vomiting in early pregnancy; this was treated with Phenergan and Reglan, and then she was placed on a Medrol taper process.  She did decline amniocentesis and AFP.  She wants a postpartum tubal.  She had an ultrasound for anomaly at 18 weeks, a complete previa and echogenic  intracardiac focus were noted.  She did have some glycosuria at 22 weeks.  Her Dextrose stick was normal.  She had an ultrasound done on September 01, 2001 which showed normal growth and development.  She was scheduled for another ultrasound today for follow-up on growth.  OBSTETRICAL HISTORY:          In 1995 the patient had a vaginal birth of a female infant, weight 7 pounds 2 ounces at 36 weeks; she was in labor five hours.  She had an epidural anesthesia.  She was induced secondary to third trimester bleeding.  MEDICAL HISTORY:              She was on contraceptives six years ago.  She had a history of endometriosis.  She had her right ovary and tube removed in 1989.  She had an HSG in 1994 that showed normal left-sided tube.  She has occasional yeast infections.  She reports the usual childhood illnesses. She has a history of occasional UTIs and pyelonephritis when she was in college.   SURGICAL HISTORY:             Include 1989 right ovary and tube removed.  HSG in 1994.  At age of [age] had her tonsils removed.  At age of 64 had her wisdom teeth removed.  ALLERGIES:  TETRACYCLINE.  FAMILY HISTORY:               Father had left ventricle hypertrophy.  Father and mother have hypertension.  Maternal grandmother has hypothyroidism. Maternal cousin has lupus.  Maternal grandfather had lung cancer.  Father had migraine headache.  Maternal grandmother had Alzheimers.  GENETIC HISTORY:              Remarkable for the patient being 35.  Father of the babys sister has positive sickle cell trait.  SOCIAL HISTORY:               The patient is married to the father of the baby.  He is involved and supportive Francesca Oman).  The patient is African-American; she denies any alcohol, drug or tobacco use during this pregnancy.  She is college educated.  She is employed with [employer]. Her husband is also employed with a local [employer]; he is also  college educated.  PHYSICAL EXAMINATION:  VITAL SIGNS:                  Stable.  The patient is afebrile.  HEENT:                        Within normal limits.  LUNGS:                        Bilateral breath sounds are clear.  HEART:                        Regular rate and rhythm without murmur.  BREASTS:                      Soft and nontender.  ABDOMEN:                      Fundal height is gravid at about 24 weeks; it is also soft and nontender.  Electronic fetal monitoring reveals reassuring fetal heart rate, with some difficulty tracing the baby secondary to persistent fetal activity.  No audible decelerations are noted.  There is very occasional mild uterine irritability noted.  The patient is unaware of this.  CERVICAL:                     Speculum examination reveals a moderate amount of dark old blood in the vault.  There is only one spot of bright red bleeding noted.  No active bleeding is noted.  The cervix appears closed and long on visual examination.  EXTREMITIES:                  Deep tendon reflexes are 2+ without clonus. There is a trace edema noted.  ULTRASOUND:                   Done today, shows normal growth; with estimated fetal weight of approximately 637 g.  The complete placenta previa is verified.  Fetus is in the vertex presentation.  Cervix is 3.5 cm long and fluid is within normal limits.  IMPRESSION:                   1. Intrauterine pregnancy at 24-1/7 weeks.                               2.  Complete placenta previa, with a bleeding episode; now resolving.  PLAN:                         1. Admit to [facility] for 23 hr observation.                               2. Betamethasone 12.5 mg IM x2 doses q.24.                               3. Bed rest with bathroom privileges.                               4. Motrin x24 hr 600 mg q.6h.                               5. CBC hold to clot.                               6. Continuous electronic fetal  monitoring.                               7. Physicians will follow this admission.  Dictated by:   Nigel Bridgeman, C.N.M. Attending Physician:  Shaune Spittle DD:  10/07/01 TD:  10/07/01 Job: [id number]

## 2010-10-18 NOTE — H&P (Signed)
[facility]  Patient:    Ashley Tyler, Ashley Tyler Visit Number: [id number] MRN: [id number]          Service Type: OBS Location: [id number] Attending Physician:  Jaymes Graff A Dictated by:   Pierre Bali. Normand Sloop, M.D. Admit Date:  12/04/2001 Discharge Date: 12/06/2001                           History and Physical  HISTORY OF PRESENT ILLNESS:   The patient is a 45 year old gravida 2, para 1, with estimated date of confinement of January 28, 2002, by 18-week ultrasound who presents at 32-1/7 weeks with bleeding this afternoon.  She said that she filled just one-fourth of a pad and then stopped bleeding.  The patient has a known placenta previa and was just admitted on November 30, 2001, for her second bleeding episode.  The patient was monitored, found not to have any contractions, and given a second course of betamethasone because she received betamethasone early at about 24 week with her first bleed.  The patient denies having any contractions, bright red vaginal bleeding, leakage of fluid. She does have good fetal movement.  The patient says that she feels well.   She also denies having any nausea or vomiting.  PRENATAL COURSE:              Her prenatal care has been at [facility] since 10 weeks.  Her complications are 1) She has known placenta previa.  Her last ultrasound was November 24, 2001, in which the patient had a posterior previa, normal A5 at 13.3, estimated fetal weight of 1820 grams.  She also had two courses of betamethasone.  2) Advanced maternal age; she declined amniocentesis.  3) Nausea or vomiting with pregnancy. 4) She had a sinus infection for which she was given a Z-Pak.   5) She had an episode of shortness of breath and tachycardia evaluated by cardiologist, Dr. Meade Maw, and found to be benign.  PRENATAL LABORATORY DATA:     A positive, Rh antibody negative, RPR nonreactive. Rubella titer is  positive.  Hepatitis B surface antigen is negative.  HIV is nonreactive.  Pap smear within normal limits.  GC and chlamydia cultures were both negative.  AFP was declined.  Glucose challenge was normal.  Hemoglobin upon entering the practice was 13.2.  PAST OBSTETRICAL HISTORY:     In 1995, she had a vaginal birth of a female infant at 58 weeks who weighed 7 pounds 2 ounces.  PAST MEDICAL HISTORY:         History of endometriosis and history of right salpingo-oophorectomy, questionable gastric reflux.  FAMILY HISTORY:               Remarkable for patients father having left ventricular hypertrophy.  The patients mother and father both have hypertension.  Maternal grandmother has hypothyroid disease.  A maternal cousin had lupus.  Paternal grandfather has lung cancer.  The patients father had migraines.  Maternal grandmother has Alzheimers disease.  PAST SURGICAL HISTORY:        Significant for a right salpingo-oophorectomy and removal of wisdom teeth and a tonsillectomy.  SOCIAL HISTORY:               The patient is married.  The father of the baby is involved and supportive; his name is Mr. Francesca Oman.  The patient is African-American, of Christian faith.  She has been  followed by the MD service at [facility].  She is employed with [employer].  She denies any drug or tobacco use.  PHYSICAL EXAMINATION:  GENERAL:                      The patient is in no apparent distress.  VITAL SIGNS:                  Blood pressure 119/70, heart rate 97, respirations 20, temperature 99.0.  Fetal heart tones 140s and reactive. Tocometer showed no contractions.  HEART:                        Regular rate and rhythm.  LUNGS:                        Clear to auscultation bilaterally.  ABDOMEN:                      Gravid, soft, and nontender.  PELVIC:                       During careful speculum exam, her cervix appears closed.  There is old blood in  the vault, no bright red vaginal bleeding.  No digital exam was done.  EXTREMITIES:                  No cyanosis, clubbing, or edema.  PERTINENT LABORATORY DATA:    UA is negative, just large hemoglobin.  White count 8.7, hemoglobin 11.4, platelet count 180.  Prior hemoglobin was 12.6 on November 30, 2001.  ASSESSMENT:                   The patient is 32-1/7 weeks with third trimester vaginal bleeding, known placenta previa, and this is her third episode of vaginal bleeding.  She will be admitted to labor and delivery. We will do continuous fetal monitoring and will give tocolysis if the patient has any contractions.  The patient will also undergo official followup ultrasound for estimated fetal amniotic fluid index and placenta location.  Will check a CBC in the morning. The patient and husband agree with the management and plan. Dictated by:   Pierre Bali. Normand Sloop, M.D. Attending Physician:  Michael Litter DD:  12/04/01 TD:  12/04/01 Job: [id number]

## 2010-10-18 NOTE — Discharge Summary (Signed)
Ashley Tyler, Ashley Tyler                  ACCOUNT NO.:  [id number]   MEDICAL RECORD NO.:  [id number]                   PATIENT TYPE:  INP   LOCATION:  9126                                 FACILITY:  [facility]   PHYSICIAN:  Naima A. Dillard, M.D.              DATE OF BIRTH:  1965-07-25   DATE OF ADMISSION:  12/28/2001  DATE OF DISCHARGE:  01/01/2002                                 DISCHARGE SUMMARY   ADMISSION DIAGNOSES:  1. Intrauterine pregnancy at 35 and four-sevenths weeks.  2. Placenta previa.  3. Vaginal bleeding secondary to #2.   DISCHARGE DIAGNOSES:  1. Intrauterine pregnancy at 35 and four-sevenths weeks.  2. Placenta previa.  3. Vaginal bleeding secondary to #2.  4. Status post primary low transverse cesarean section and left tubal     ligation.  5. Infant in neonatal intensive care unit with rule out pneumonia.   HOSPITAL PROCEDURES:  1. Spinal anesthesia.  2. Primary low transverse cesarean section for a female infant, Apgars 5 and     7.  3. Left tubal ligation.   HOSPITAL COURSE:  The patient was admitted from home via 911 with onset of  bleeding with a known placenta previa.  This was the fourth episode during  this pregnancy.  She had previously been scheduled for January 05, 2002 for a  C section and upon arrival decision was made to proceed with a C section due  to vaginal bleeding.  The surgery was performed successfully by Dr. Normand Sloop  with an EBL of 1500 cc.  A female infant was delivered with cord pH of 7.1,  Apgars of 5 and 7.  The right tube and ovary were surgically absent and a  tubal ligation was performed on the left tube.  The patient was taken to  recovery and was stable on postoperative day #1 with a hemoglobin of 10.6.  Physical exam was within normal limits.  The baby was stable.  On  postoperative day #2 the patient was doing well except for some gastric  distention.  The infant was transferred to the NICU for possible infection  and placed  on antibiotics.  Her vital signs were stable, she was afebrile,  and she did well.  On postoperative day #3 vital signs were stable, she was  afebrile.  Lungs were clear.  Heart rate was regular rate and rhythm.  Incision was clean, dry, and intact.  Abdomen was soft and appropriately  tender.  Lochia was small.  Extremities were within normal limits.  The  patient was deemed to have received the full benefit of her hospital stay  and was discharged home.   DISCHARGE MEDICATIONS:  1. Motrin 600 mg p.o. q.6h. p.r.n.  2. Tylox one to two p.o. q.4h. p.r.n.   DISCHARGE LABORATORY DATA:  White blood cell count 9.0, hemoglobin 10.6,  platelets 151.  RPR nonreactive.    DISCHARGE INSTRUCTIONS:  Per [facility] handout.   DISCHARGE FOLLOW-UP:  In six weeks or p.r.n.       Marie L. Williams, C.N.M.                 Naima A. Normand Sloop, M.D.    MLW/MEDQ  D:  12/31/2001  T:  01/06/2002  Job:  [id number]

## 2011-06-30 ENCOUNTER — Other Ambulatory Visit (HOSPITAL_COMMUNITY): Payer: Self-pay | Admitting: Obstetrics and Gynecology

## 2011-06-30 DIAGNOSIS — Z1231 Encounter for screening mammogram for malignant neoplasm of breast: Secondary | ICD-10-CM

## 2011-07-25 ENCOUNTER — Ambulatory Visit (HOSPITAL_COMMUNITY)
Admission: RE | Admit: 2011-07-25 | Discharge: 2011-07-25 | Disposition: A | Discharge status: Home / Self Care | Payer: 59 | Source: Ambulatory Visit | Attending: Obstetrics and Gynecology | Admitting: Obstetrics and Gynecology

## 2011-07-25 DIAGNOSIS — Z1231 Encounter for screening mammogram for malignant neoplasm of breast: Secondary | ICD-10-CM | POA: Insufficient documentation

## 2012-06-04 ENCOUNTER — Other Ambulatory Visit: Payer: Self-pay | Admitting: Obstetrics and Gynecology

## 2012-06-04 DIAGNOSIS — Z1231 Encounter for screening mammogram for malignant neoplasm of breast: Secondary | ICD-10-CM

## 2012-07-26 ENCOUNTER — Ambulatory Visit (HOSPITAL_COMMUNITY)
Admission: RE | Admit: 2012-07-26 | Discharge: 2012-07-26 | Disposition: A | Discharge status: Home / Self Care | Payer: 59 | Source: Ambulatory Visit | Attending: Obstetrics and Gynecology | Admitting: Obstetrics and Gynecology

## 2012-07-26 DIAGNOSIS — Z1231 Encounter for screening mammogram for malignant neoplasm of breast: Secondary | ICD-10-CM | POA: Insufficient documentation

## 2012-07-30 ENCOUNTER — Encounter: Payer: Self-pay | Admitting: Obstetrics and Gynecology

## 2012-07-30 DIAGNOSIS — R922 Inconclusive mammogram: Secondary | ICD-10-CM | POA: Insufficient documentation

## 2013-06-06 ENCOUNTER — Other Ambulatory Visit: Payer: Self-pay | Admitting: Obstetrics and Gynecology

## 2013-06-06 DIAGNOSIS — Z1231 Encounter for screening mammogram for malignant neoplasm of breast: Secondary | ICD-10-CM

## 2013-07-28 ENCOUNTER — Ambulatory Visit (HOSPITAL_COMMUNITY): Payer: 59

## 2013-08-04 ENCOUNTER — Ambulatory Visit (HOSPITAL_COMMUNITY)
Admission: RE | Admit: 2013-08-04 | Discharge: 2013-08-04 | Disposition: A | Discharge status: Home / Self Care | Payer: 59 | Source: Ambulatory Visit | Attending: Obstetrics and Gynecology | Admitting: Obstetrics and Gynecology

## 2013-08-04 DIAGNOSIS — Z1231 Encounter for screening mammogram for malignant neoplasm of breast: Secondary | ICD-10-CM | POA: Insufficient documentation

## 2013-08-08 ENCOUNTER — Other Ambulatory Visit: Payer: Self-pay | Admitting: Obstetrics and Gynecology

## 2013-08-08 DIAGNOSIS — R928 Other abnormal and inconclusive findings on diagnostic imaging of breast: Secondary | ICD-10-CM

## 2013-08-19 ENCOUNTER — Ambulatory Visit
Admission: RE | Admit: 2013-08-19 | Discharge: 2013-08-19 | Disposition: A | Discharge status: Home / Self Care | Payer: 59 | Source: Ambulatory Visit | Attending: Obstetrics and Gynecology | Admitting: Obstetrics and Gynecology

## 2013-08-19 ENCOUNTER — Other Ambulatory Visit: Payer: Self-pay | Admitting: Obstetrics and Gynecology

## 2013-08-19 DIAGNOSIS — R928 Other abnormal and inconclusive findings on diagnostic imaging of breast: Secondary | ICD-10-CM

## 2013-08-19 DIAGNOSIS — N63 Unspecified lump in unspecified breast: Secondary | ICD-10-CM

## 2013-08-22 ENCOUNTER — Other Ambulatory Visit: Payer: Self-pay

## 2013-08-22 ENCOUNTER — Other Ambulatory Visit: Payer: Self-pay | Admitting: Obstetrics and Gynecology

## 2013-08-22 DIAGNOSIS — N63 Unspecified lump in unspecified breast: Secondary | ICD-10-CM

## 2013-08-23 ENCOUNTER — Ambulatory Visit
Admission: RE | Admit: 2013-08-23 | Discharge: 2013-08-23 | Disposition: A | Discharge status: Home / Self Care | Payer: 59 | Source: Ambulatory Visit | Attending: Obstetrics and Gynecology | Admitting: Obstetrics and Gynecology

## 2013-08-23 DIAGNOSIS — N63 Unspecified lump in unspecified breast: Secondary | ICD-10-CM

## 2013-11-13 ENCOUNTER — Emergency Department (HOSPITAL_COMMUNITY)
Admission: EM | Admit: 2013-11-13 | Discharge: 2013-11-14 | Disposition: A | Discharge status: Home / Self Care | Payer: 59 | Attending: Emergency Medicine | Admitting: Emergency Medicine

## 2013-11-13 ENCOUNTER — Emergency Department (HOSPITAL_COMMUNITY): Payer: 59

## 2013-11-13 ENCOUNTER — Encounter (HOSPITAL_COMMUNITY): Payer: Self-pay | Admitting: Emergency Medicine

## 2013-11-13 DIAGNOSIS — Z3202 Encounter for pregnancy test, result negative: Secondary | ICD-10-CM | POA: Insufficient documentation

## 2013-11-13 DIAGNOSIS — E860 Dehydration: Secondary | ICD-10-CM | POA: Insufficient documentation

## 2013-11-13 DIAGNOSIS — R110 Nausea: Secondary | ICD-10-CM

## 2013-11-13 DIAGNOSIS — R630 Anorexia: Secondary | ICD-10-CM | POA: Insufficient documentation

## 2013-11-13 DIAGNOSIS — R51 Headache: Secondary | ICD-10-CM | POA: Insufficient documentation

## 2013-11-13 DIAGNOSIS — R55 Syncope and collapse: Secondary | ICD-10-CM

## 2013-11-13 LAB — URINALYSIS, ROUTINE W REFLEX MICROSCOPIC
BILIRUBIN URINE: NEGATIVE
Glucose, UA: NEGATIVE mg/dL
Hgb urine dipstick: NEGATIVE
KETONES UR: 15 mg/dL — AB
NITRITE: NEGATIVE
PH: 5.5 (ref 5.0–8.0)
Protein, ur: NEGATIVE mg/dL
Specific Gravity, Urine: 1.013 (ref 1.005–1.030)
UROBILINOGEN UA: 0.2 mg/dL (ref 0.0–1.0)

## 2013-11-13 LAB — BASIC METABOLIC PANEL
BUN: 13 mg/dL (ref 6–23)
CALCIUM: 9 mg/dL (ref 8.4–10.5)
CO2: 25 mEq/L (ref 19–32)
Chloride: 103 mEq/L (ref 96–112)
Creatinine, Ser: 1.05 mg/dL (ref 0.50–1.10)
GFR calc non Af Amer: 61 mL/min — ABNORMAL LOW (ref >90)
GFR, EST AFRICAN AMERICAN: 71 mL/min — AB (ref >90)
Glucose, Bld: 91 mg/dL (ref 70–99)
Potassium: 4 mEq/L (ref 3.7–5.3)
Sodium: 141 mEq/L (ref 137–147)

## 2013-11-13 LAB — CBC
HEMATOCRIT: 38.3 % (ref 36.0–46.0)
Hemoglobin: 12.5 g/dL (ref 12.0–15.0)
MCH: 28 pg (ref 26.0–34.0)
MCHC: 32.6 g/dL (ref 30.0–36.0)
MCV: 85.7 fL (ref 78.0–100.0)
PLATELETS: 222 10*3/uL (ref 150–400)
RBC: 4.47 MIL/uL (ref 3.87–5.11)
RDW: 13.7 % (ref 11.5–15.5)
WBC: 8.6 10*3/uL (ref 4.0–10.5)

## 2013-11-13 LAB — PREGNANCY, URINE: Preg Test, Ur: NEGATIVE

## 2013-11-13 LAB — URINE MICROSCOPIC-ADD ON

## 2013-11-13 MED ORDER — ONDANSETRON HCL 4 MG/2ML IJ SOLN
4.0000 mg | Freq: Once | INTRAMUSCULAR | Status: AC
  Administered 2013-11-13: 4 mg via INTRAVENOUS
  Filled 2013-11-13: qty 2

## 2013-11-13 MED ORDER — ONDANSETRON HCL 4 MG/2ML IJ SOLN
4.0000 mg | Freq: Once | INTRAMUSCULAR | Status: DC
  Filled 2013-11-13: qty 2

## 2013-11-13 MED ORDER — SODIUM CHLORIDE 0.9 % IV BOLUS (SEPSIS)
1000.0000 mL | Freq: Once | INTRAVENOUS | Status: AC
  Administered 2013-11-13: 1000 mL via INTRAVENOUS

## 2013-11-13 NOTE — Discharge Instructions (Signed)
If you were given medicines take as directed.  If you are on coumadin or contraceptives realize their levels and effectiveness is altered by many different medicines.  If you have any reaction (rash, tongues swelling, other) to the medicines stop taking and see a physician.   Please follow up as directed and return to the ER or see a physician for new or worsening symptoms.  Thank you. Filed Vitals:   11/13/13 2130 11/13/13 2200 11/13/13 2249 11/13/13 2300  BP: 110/63 112/70 100/56 103/55  Pulse: 83 84 78 86  Temp:      Resp: 16 29 13 16   Height:      Weight:      SpO2: 98% 98% 99% 97%

## 2013-11-13 NOTE — ED Notes (Signed)
Pt up to bathroom.  Only remaining symptom is nausea.

## 2013-11-13 NOTE — ED Provider Notes (Signed)
CSN: [id number]     Arrival date & time 11/13/13  1940 History   First MD Initiated Contact with Patient 11/13/13 1952     Chief Complaint  Patient presents with  . Loss of Consciousness     (Consider location/radiation/quality/duration/timing/severity/associated sxs/prior Treatment) HPI Comments: 48 year old female with no significant medical history, no alcohol a smoking presents after syncope. Patient driving felt lightheaded and then pulled over another episode however had complete syncope. No history of similar. Patient has not been drinking adequate fluids for today however has never had this before. No chest pain, shortness of breath or acute headache. Patient did have a headache yesterday to resolve. Patient denies active bleeding or arrhythmia history. Patient feels mild improvement since our has nausea and vomiting.  Patient is a 48 y.o. female presenting with syncope. The history is provided by the patient.  Loss of Consciousness Associated symptoms: headaches (gradual onset overall similar to previous.) and nausea   Associated symptoms: no chest pain, no fever, no shortness of breath and no vomiting     History reviewed. No pertinent past medical history. Past Surgical History  Procedure Laterality Date  . Cesarean section    . Tonsillectomy    . Laparoscopic salpingo oopherectomy Right    History reviewed. No pertinent family history. History  Substance Use Topics  . Smoking status: Never Smoker   . Smokeless tobacco: Not on file  . Alcohol Use: No   OB History   Grav Para Term Preterm Abortions TAB SAB Ect Mult Living                 Review of Systems  Constitutional: Positive for appetite change. Negative for fever and chills.  HENT: Negative for congestion.   Eyes: Negative for visual disturbance.  Respiratory: Negative for shortness of breath.   Cardiovascular: Positive for syncope. Negative for chest pain.  Gastrointestinal: Positive for nausea. Negative  for vomiting and abdominal pain.  Genitourinary: Negative for dysuria and flank pain.  Musculoskeletal: Negative for back pain, neck pain and neck stiffness.  Skin: Negative for rash.  Neurological: Positive for syncope, light-headedness and headaches (gradual onset overall similar to previous.).      Allergies  Tetracyclines & related  Home Medications   Prior to Admission medications   Not on File   BP 104/66  Pulse 86  Temp(Src) 98.6 F (37 C)  Resp 12  Ht 5' 5.5" (1.664 m)  Wt 174 lb (78.926 kg)  BMI 28.50 kg/m2  SpO2 100%  LMP 10/13/2013 Physical Exam  Nursing note and vitals reviewed. Constitutional: She is oriented to person, place, and time. She appears well-developed and well-nourished.  HENT:  Head: Normocephalic and atraumatic.  Mild dry mucous membranes   Eyes: Conjunctivae are normal. Right eye exhibits no discharge. Left eye exhibits no discharge.  Neck: Normal range of motion. Neck supple. No tracheal deviation present.  Cardiovascular: Normal rate and regular rhythm.   Pulmonary/Chest: Effort normal and breath sounds normal.  Abdominal: Soft. She exhibits no distension. There is no tenderness. There is no guarding.  Musculoskeletal: She exhibits no edema.  Neurological: She is alert and oriented to person, place, and time. GCS eye subscore is 4. GCS verbal subscore is 5. GCS motor subscore is 6.  5+ strength in UE and LE with f/e at major joints. Sensation to palpation intact in UE and LE. CNs 2-12 grossly intact.  EOMFI.  PERRL.   Finger nose and coordination intact bilateral.   Visual fields intact  to finger testing.   Skin: Skin is warm. No rash noted.  Psychiatric: She has a normal mood and affect.    ED Course  Procedures (including critical care time) Labs Review Labs Reviewed  BASIC METABOLIC PANEL - Abnormal; Notable for the following:    GFR calc non Af Amer 61 (*)    GFR calc Af Amer 71 (*)    All other components within normal limits   URINALYSIS, ROUTINE W REFLEX MICROSCOPIC - Abnormal; Notable for the following:    APPearance CLOUDY (*)    Ketones, ur 15 (*)    Leukocytes, UA TRACE (*)    All other components within normal limits  URINE MICROSCOPIC-ADD ON - Abnormal; Notable for the following:    Squamous Epithelial / LPF FEW (*)    All other components within normal limits  CBC  PREGNANCY, URINE  TROPONIN I    Imaging Review Ct Head Wo Contrast  11/13/2013   CLINICAL DATA:  Syncopal episode.  EXAM: CT HEAD WITHOUT CONTRAST  TECHNIQUE: Contiguous axial images were obtained from the base of the skull through the vertex without intravenous contrast.  COMPARISON:  CT head without contrast 11/27/2006.  FINDINGS: No acute cortical infarct, hemorrhage, or mass lesion is present. The ventricles are of normal size. No significant extra-axial fluid collection is evident. The paranasal sinuses and mastoid air cells are clear. The osseous skull is intact.  IMPRESSION: Negative CT of the head.   Electronically Signed   By: Lawrence Santiago M.D.   On: 11/13/2013 21:35     EKG Interpretation   Date/Time:  Sunday November 13 2013 19:42:04 EDT Ventricular Rate:  89 PR Interval:  151 QRS Duration: 123 QT Interval:  370 QTC Calculation: 450 R Axis:   0 Text Interpretation:  Sinus rhythm Nonspecific intraventricular conduction  delay Confirmed by Zaneta Lightcap  MD, Saksham Akkerman ([id number]) on 11/13/2013 8:47:12 PM Also  confirmed by Reather Converse  MD, Keyandra Swenson ([id number])  on 11/13/2013 10:52:06 PM      MDM   Final diagnoses:  Nausea  Dehydration  Syncope and collapse   Healthy patient with lightheaded sensation followed by syncope. No witnessed seizure activity by the daughter. Patient feels mild improvement however has persistent nausea and vomiting in the room now. Patient did have mild headache yesterday which is not concerning her however with syncope the day after and vomiting plan for screening CT head, screening blood work him IV fluid bolus. Is  likely dehydration with decreased fluid intake and hot weather recently however we'll check for other or more emergent causes. Pt improved significantly on recheck, tolerating po.   Results and differential diagnosis were discussed with the patient/parent/guardian. Close follow up outpatient was discussed, comfortable with the plan.   Medications  sodium chloride 0.9 % bolus 1,000 mL (not administered)  ondansetron (ZOFRAN) injection 4 mg (not administered)  ondansetron (ZOFRAN) injection 4 mg (not administered)    Filed Vitals:   11/13/13 1945 11/13/13 2002  BP: 109/67 104/66  Pulse:  86  Temp: 98.6 F (37 C)   Resp: 17 12  Height: 5' 5.5" (1.664 m)   Weight: 174 lb (78.926 kg)   SpO2: 99% 100%       Mariea Clonts, MD 11/13/13 2358

## 2013-11-13 NOTE — ED Notes (Signed)
Pt picked up daughter from outside event, felt as if she was going to pass out.  Drove, felt strange, pulled off road and experienced an apparent syncopal episode.

## 2014-06-22 ENCOUNTER — Other Ambulatory Visit (HOSPITAL_COMMUNITY): Payer: Self-pay | Admitting: Obstetrics and Gynecology

## 2014-06-22 DIAGNOSIS — Z1231 Encounter for screening mammogram for malignant neoplasm of breast: Secondary | ICD-10-CM

## 2014-08-22 ENCOUNTER — Ambulatory Visit (HOSPITAL_COMMUNITY)
Admission: RE | Admit: 2014-08-22 | Discharge: 2014-08-22 | Disposition: A | Discharge status: Home / Self Care | Payer: 59 | Source: Ambulatory Visit | Attending: Obstetrics and Gynecology | Admitting: Obstetrics and Gynecology

## 2014-08-22 DIAGNOSIS — Z1231 Encounter for screening mammogram for malignant neoplasm of breast: Secondary | ICD-10-CM | POA: Diagnosis not present

## 2015-07-24 DIAGNOSIS — C50919 Malignant neoplasm of unspecified site of unspecified female breast: Secondary | ICD-10-CM | POA: Insufficient documentation

## 2015-07-25 ENCOUNTER — Other Ambulatory Visit: Payer: Self-pay | Admitting: Obstetrics and Gynecology

## 2015-07-25 DIAGNOSIS — R928 Other abnormal and inconclusive findings on diagnostic imaging of breast: Secondary | ICD-10-CM

## 2015-07-25 DIAGNOSIS — N63 Unspecified lump in unspecified breast: Secondary | ICD-10-CM

## 2015-07-30 ENCOUNTER — Other Ambulatory Visit: Payer: Self-pay | Admitting: Obstetrics and Gynecology

## 2015-07-30 ENCOUNTER — Ambulatory Visit
Admission: RE | Admit: 2015-07-30 | Discharge: 2015-07-30 | Disposition: A | Discharge status: Home / Self Care | Payer: 59 | Source: Ambulatory Visit | Attending: Obstetrics and Gynecology | Admitting: Obstetrics and Gynecology

## 2015-07-30 DIAGNOSIS — N63 Unspecified lump in unspecified breast: Secondary | ICD-10-CM

## 2015-07-30 DIAGNOSIS — N6320 Unspecified lump in the left breast, unspecified quadrant: Secondary | ICD-10-CM

## 2015-07-30 DIAGNOSIS — R928 Other abnormal and inconclusive findings on diagnostic imaging of breast: Secondary | ICD-10-CM

## 2015-08-01 HISTORY — PX: BREAST LUMPECTOMY: SHX2

## 2015-08-03 ENCOUNTER — Ambulatory Visit
Admission: RE | Admit: 2015-08-03 | Discharge: 2015-08-03 | Disposition: A | Discharge status: Home / Self Care | Payer: 59 | Source: Ambulatory Visit | Attending: Obstetrics and Gynecology | Admitting: Obstetrics and Gynecology

## 2015-08-03 ENCOUNTER — Other Ambulatory Visit: Payer: Self-pay | Admitting: Obstetrics and Gynecology

## 2015-08-03 DIAGNOSIS — N6320 Unspecified lump in the left breast, unspecified quadrant: Secondary | ICD-10-CM

## 2015-08-09 ENCOUNTER — Other Ambulatory Visit: Payer: Self-pay | Admitting: General Surgery

## 2015-08-09 DIAGNOSIS — C50412 Malignant neoplasm of upper-outer quadrant of left female breast: Secondary | ICD-10-CM

## 2015-08-13 ENCOUNTER — Other Ambulatory Visit: Payer: Self-pay | Admitting: General Surgery

## 2015-08-13 DIAGNOSIS — C50412 Malignant neoplasm of upper-outer quadrant of left female breast: Secondary | ICD-10-CM

## 2015-08-15 ENCOUNTER — Encounter: Payer: Self-pay | Admitting: Genetic Counselor

## 2015-08-20 ENCOUNTER — Encounter (HOSPITAL_BASED_OUTPATIENT_CLINIC_OR_DEPARTMENT_OTHER): Payer: Self-pay | Admitting: *Deleted

## 2015-08-24 ENCOUNTER — Other Ambulatory Visit: Payer: Self-pay | Admitting: General Surgery

## 2015-08-24 ENCOUNTER — Ambulatory Visit
Admission: RE | Admit: 2015-08-24 | Discharge: 2015-08-24 | Disposition: A | Discharge status: Home / Self Care | Payer: 59 | Source: Ambulatory Visit | Attending: General Surgery | Admitting: General Surgery

## 2015-08-24 DIAGNOSIS — C50412 Malignant neoplasm of upper-outer quadrant of left female breast: Secondary | ICD-10-CM

## 2015-08-27 ENCOUNTER — Telehealth: Payer: Self-pay | Admitting: Oncology

## 2015-08-27 NOTE — Telephone Encounter (Signed)
new breast -patient scheduled for 04/13 @ 4 w/Dr. [name]. Referring Dr. [name] Referral information scan

## 2015-08-28 ENCOUNTER — Ambulatory Visit (HOSPITAL_COMMUNITY)
Admission: RE | Admit: 2015-08-28 | Discharge: 2015-08-28 | Disposition: A | Discharge status: Home / Self Care | Payer: 59 | Source: Ambulatory Visit | Attending: General Surgery | Admitting: General Surgery

## 2015-08-28 ENCOUNTER — Encounter (HOSPITAL_BASED_OUTPATIENT_CLINIC_OR_DEPARTMENT_OTHER): Payer: Self-pay | Admitting: *Deleted

## 2015-08-28 ENCOUNTER — Ambulatory Visit
Admission: RE | Admit: 2015-08-28 | Discharge: 2015-08-28 | Disposition: A | Discharge status: Home / Self Care | Payer: 59 | Source: Ambulatory Visit | Attending: General Surgery | Admitting: General Surgery

## 2015-08-28 ENCOUNTER — Ambulatory Visit (HOSPITAL_BASED_OUTPATIENT_CLINIC_OR_DEPARTMENT_OTHER)
Admission: RE | Admit: 2015-08-28 | Discharge: 2015-08-28 | Disposition: A | Discharge status: Home / Self Care | Payer: 59 | Source: Ambulatory Visit | Attending: General Surgery | Admitting: General Surgery

## 2015-08-28 ENCOUNTER — Ambulatory Visit (HOSPITAL_BASED_OUTPATIENT_CLINIC_OR_DEPARTMENT_OTHER): Payer: 59 | Admitting: Anesthesiology

## 2015-08-28 ENCOUNTER — Encounter (HOSPITAL_BASED_OUTPATIENT_CLINIC_OR_DEPARTMENT_OTHER)
Admission: RE | Disposition: A | Discharge status: Home / Self Care | Payer: Self-pay | Source: Ambulatory Visit | Attending: General Surgery

## 2015-08-28 DIAGNOSIS — Z170 Estrogen receptor positive status [ER+]: Secondary | ICD-10-CM | POA: Insufficient documentation

## 2015-08-28 DIAGNOSIS — C50412 Malignant neoplasm of upper-outer quadrant of left female breast: Secondary | ICD-10-CM | POA: Insufficient documentation

## 2015-08-28 DIAGNOSIS — Z791 Long term (current) use of non-steroidal anti-inflammatories (NSAID): Secondary | ICD-10-CM | POA: Insufficient documentation

## 2015-08-28 DIAGNOSIS — C50912 Malignant neoplasm of unspecified site of left female breast: Secondary | ICD-10-CM | POA: Diagnosis present

## 2015-08-28 HISTORY — DX: Malignant (primary) neoplasm, unspecified: C80.1

## 2015-08-28 HISTORY — PX: BREAST LUMPECTOMY WITH RADIOACTIVE SEED AND SENTINEL LYMPH NODE BIOPSY: SHX6550

## 2015-08-28 HISTORY — DX: Headache, unspecified: R51.9

## 2015-08-28 HISTORY — DX: Headache: R51

## 2015-08-28 HISTORY — PX: EXCISION / BIOPSY BREAST / NIPPLE / DUCT: SUR469

## 2015-08-28 LAB — POCT HEMOGLOBIN-HEMACUE: HEMOGLOBIN: 12.5 g/dL (ref 12.0–15.0)

## 2015-08-28 SURGERY — BREAST LUMPECTOMY WITH RADIOACTIVE SEED AND SENTINEL LYMPH NODE BIOPSY
Anesthesia: General | Site: Breast | Laterality: Left

## 2015-08-28 MED ORDER — FENTANYL CITRATE (PF) 100 MCG/2ML IJ SOLN
INTRAMUSCULAR | Status: AC
  Filled 2015-08-28: qty 2

## 2015-08-28 MED ORDER — LACTATED RINGERS IV SOLN
INTRAVENOUS | Status: DC
  Administered 2015-08-28 (×3): via INTRAVENOUS

## 2015-08-28 MED ORDER — CHLORHEXIDINE GLUCONATE 4 % EX LIQD: 1.0000 "application " | Freq: Once | CUTANEOUS | Status: DC

## 2015-08-28 MED ORDER — FENTANYL CITRATE (PF) 100 MCG/2ML IJ SOLN
50.0000 ug | INTRAMUSCULAR | Status: AC | PRN
  Administered 2015-08-28: 50 ug via INTRAVENOUS
  Administered 2015-08-28: 100 ug via INTRAVENOUS
  Administered 2015-08-28: 50 ug via INTRAVENOUS

## 2015-08-28 MED ORDER — HYDROMORPHONE HCL 1 MG/ML IJ SOLN
INTRAMUSCULAR | Status: AC
  Filled 2015-08-28: qty 1

## 2015-08-28 MED ORDER — MIDAZOLAM HCL 2 MG/2ML IJ SOLN
1.0000 mg | INTRAMUSCULAR | Status: DC | PRN
Start: 2015-08-28 — End: 2015-08-28
  Administered 2015-08-28: 2 mg via INTRAVENOUS

## 2015-08-28 MED ORDER — ONDANSETRON HCL 4 MG/2ML IJ SOLN
INTRAMUSCULAR | Status: AC
  Filled 2015-08-28: qty 2

## 2015-08-28 MED ORDER — MEPERIDINE HCL 25 MG/ML IJ SOLN: 6.2500 mg | INTRAMUSCULAR | Status: DC | PRN

## 2015-08-28 MED ORDER — METHYLENE BLUE 0.5 % INJ SOLN
INTRAVENOUS | Status: DC | PRN
  Administered 2015-08-28: 5 mL

## 2015-08-28 MED ORDER — CEFAZOLIN SODIUM 1-5 GM-% IV SOLN
INTRAVENOUS | Status: AC
  Filled 2015-08-28: qty 100

## 2015-08-28 MED ORDER — DEXAMETHASONE SODIUM PHOSPHATE 4 MG/ML IJ SOLN
INTRAMUSCULAR | Status: DC | PRN
  Administered 2015-08-28: 10 mg via INTRAVENOUS

## 2015-08-28 MED ORDER — ONDANSETRON HCL 4 MG/2ML IJ SOLN: 4.0000 mg | Freq: Once | INTRAMUSCULAR | Status: DC | PRN

## 2015-08-28 MED ORDER — LIDOCAINE HCL (CARDIAC) 20 MG/ML IV SOLN
INTRAVENOUS | Status: DC | PRN
  Administered 2015-08-28: 50 mg via INTRAVENOUS

## 2015-08-28 MED ORDER — HYDROCODONE-ACETAMINOPHEN 5-325 MG PO TABS: 1.0000 | ORAL_TABLET | ORAL | Status: DC | PRN

## 2015-08-28 MED ORDER — OXYCODONE HCL 5 MG PO TABS
5.0000 mg | ORAL_TABLET | ORAL | Status: DC | PRN
  Administered 2015-08-28: 5 mg via ORAL

## 2015-08-28 MED ORDER — OXYCODONE HCL 5 MG PO TABS
ORAL_TABLET | ORAL | Status: AC
  Filled 2015-08-28: qty 1

## 2015-08-28 MED ORDER — LIDOCAINE HCL (CARDIAC) 20 MG/ML IV SOLN
INTRAVENOUS | Status: AC
  Filled 2015-08-28: qty 5

## 2015-08-28 MED ORDER — ONDANSETRON HCL 4 MG/2ML IJ SOLN
INTRAMUSCULAR | Status: DC | PRN
  Administered 2015-08-28: 4 mg via INTRAVENOUS

## 2015-08-28 MED ORDER — PROPOFOL 10 MG/ML IV BOLUS
INTRAVENOUS | Status: DC | PRN
  Administered 2015-08-28: 200 mg via INTRAVENOUS
  Administered 2015-08-28: 50 mg via INTRAVENOUS

## 2015-08-28 MED ORDER — DEXAMETHASONE SODIUM PHOSPHATE 10 MG/ML IJ SOLN
INTRAMUSCULAR | Status: AC
  Filled 2015-08-28: qty 1

## 2015-08-28 MED ORDER — SCOPOLAMINE 1 MG/3DAYS TD PT72: 1.0000 | MEDICATED_PATCH | Freq: Once | TRANSDERMAL | Status: DC | PRN

## 2015-08-28 MED ORDER — DEXTROSE 5 % IV SOLN
2.0000 g | INTRAVENOUS | Status: AC
  Administered 2015-08-28: 2 g via INTRAVENOUS

## 2015-08-28 MED ORDER — HYDROMORPHONE HCL 1 MG/ML IJ SOLN
0.2500 mg | INTRAMUSCULAR | Status: DC | PRN
  Administered 2015-08-28: 0.5 mg via INTRAVENOUS

## 2015-08-28 MED ORDER — PHENYLEPHRINE HCL 10 MG/ML IJ SOLN
INTRAMUSCULAR | Status: DC | PRN
  Administered 2015-08-28: 40 ug via INTRAVENOUS
  Administered 2015-08-28 (×2): 80 ug via INTRAVENOUS

## 2015-08-28 MED ORDER — BUPIVACAINE-EPINEPHRINE (PF) 0.5% -1:200000 IJ SOLN
INTRAMUSCULAR | Status: DC | PRN
  Administered 2015-08-28: 20 mL

## 2015-08-28 MED ORDER — PROMETHAZINE HCL 25 MG/ML IJ SOLN
6.2500 mg | Freq: Once | INTRAMUSCULAR | Status: AC
  Administered 2015-08-28: 6.25 mg via INTRAVENOUS

## 2015-08-28 MED ORDER — GLYCOPYRROLATE 0.2 MG/ML IJ SOLN: 0.2000 mg | Freq: Once | INTRAMUSCULAR | Status: DC | PRN

## 2015-08-28 MED ORDER — MIDAZOLAM HCL 2 MG/2ML IJ SOLN
INTRAMUSCULAR | Status: AC
  Filled 2015-08-28: qty 2

## 2015-08-28 MED ORDER — TECHNETIUM TC 99M SULFUR COLLOID FILTERED
1.0000 | Freq: Once | INTRAVENOUS | Status: AC | PRN
  Administered 2015-08-28: 1 via INTRADERMAL

## 2015-08-28 MED ORDER — PROMETHAZINE HCL 25 MG/ML IJ SOLN
INTRAMUSCULAR | Status: AC
  Filled 2015-08-28: qty 1

## 2015-08-28 SURGICAL SUPPLY — 51 items
APPLIER CLIP 9.375 MED OPEN (MISCELLANEOUS) ×2
APR CLP MED 9.3 20 MLT OPN (MISCELLANEOUS) ×1
BINDER BREAST LRG (GAUZE/BANDAGES/DRESSINGS) ×1 IMPLANT
BINDER BREAST MEDIUM (GAUZE/BANDAGES/DRESSINGS) IMPLANT
BINDER BREAST XLRG (GAUZE/BANDAGES/DRESSINGS) IMPLANT
BINDER BREAST XXLRG (GAUZE/BANDAGES/DRESSINGS) IMPLANT
BLADE SURG 15 STRL LF DISP TIS (BLADE) ×1 IMPLANT
BLADE SURG 15 STRL SS (BLADE) ×2
CANISTER SUC SOCK COL 7IN (MISCELLANEOUS) IMPLANT
CANISTER SUCT 1200ML W/VALVE (MISCELLANEOUS) ×1 IMPLANT
CHLORAPREP W/TINT 26ML (MISCELLANEOUS) ×2 IMPLANT
CLIP APPLIE 9.375 MED OPEN (MISCELLANEOUS) ×1 IMPLANT
COVER BACK TABLE 60X90IN (DRAPES) ×2 IMPLANT
COVER MAYO STAND STRL (DRAPES) ×2 IMPLANT
COVER PROBE W GEL 5X96 (DRAPES) ×2 IMPLANT
DECANTER SPIKE VIAL GLASS SM (MISCELLANEOUS) IMPLANT
DEVICE DUBIN W/COMP PLATE 8390 (MISCELLANEOUS) ×2 IMPLANT
DRAPE LAPAROSCOPIC ABDOMINAL (DRAPES) ×2 IMPLANT
DRAPE UTILITY XL STRL (DRAPES) ×2 IMPLANT
ELECT COATED BLADE 2.86 ST (ELECTRODE) ×2 IMPLANT
ELECT REM PT RETURN 9FT ADLT (ELECTROSURGICAL) ×2
ELECTRODE REM PT RTRN 9FT ADLT (ELECTROSURGICAL) ×1 IMPLANT
GLOVE BIOGEL PI IND STRL 7.0 (GLOVE) IMPLANT
GLOVE BIOGEL PI IND STRL 8 (GLOVE) ×1 IMPLANT
GLOVE BIOGEL PI INDICATOR 7.0 (GLOVE) ×1
GLOVE BIOGEL PI INDICATOR 8 (GLOVE) ×1
GLOVE ECLIPSE 6.5 STRL STRAW (GLOVE) ×2 IMPLANT
GLOVE ECLIPSE 7.5 STRL STRAW (GLOVE) ×2 IMPLANT
GOWN STRL REUS W/ TWL LRG LVL3 (GOWN DISPOSABLE) ×1 IMPLANT
GOWN STRL REUS W/ TWL XL LVL3 (GOWN DISPOSABLE) ×1 IMPLANT
GOWN STRL REUS W/TWL LRG LVL3 (GOWN DISPOSABLE) ×2
GOWN STRL REUS W/TWL XL LVL3 (GOWN DISPOSABLE) ×2
ILLUMINATOR WAVEGUIDE N/F (MISCELLANEOUS) ×1 IMPLANT
KIT MARKER MARGIN INK (KITS) ×2 IMPLANT
LIQUID BAND (GAUZE/BANDAGES/DRESSINGS) ×2 IMPLANT
NDL HYPO 25X1 1.5 SAFETY (NEEDLE) ×2 IMPLANT
NDL SAFETY ECLIPSE 18X1.5 (NEEDLE) ×1 IMPLANT
NEEDLE HYPO 18GX1.5 SHARP (NEEDLE) ×2
NEEDLE HYPO 25X1 1.5 SAFETY (NEEDLE) ×4 IMPLANT
NS IRRIG 1000ML POUR BTL (IV SOLUTION) ×1 IMPLANT
PACK BASIN DAY SURGERY FS (CUSTOM PROCEDURE TRAY) ×2 IMPLANT
PENCIL BUTTON HOLSTER BLD 10FT (ELECTRODE) ×2 IMPLANT
SLEEVE SCD COMPRESS KNEE MED (MISCELLANEOUS) ×2 IMPLANT
SPONGE LAP 4X18 X RAY DECT (DISPOSABLE) ×3 IMPLANT
SUT MON AB 5-0 PS2 18 (SUTURE) ×2 IMPLANT
SUT VICRYL 3-0 CR8 SH (SUTURE) ×2 IMPLANT
SYR CONTROL 10ML LL (SYRINGE) ×4 IMPLANT
TOWEL OR 17X24 6PK STRL BLUE (TOWEL DISPOSABLE) ×2 IMPLANT
TOWEL OR NON WOVEN STRL DISP B (DISPOSABLE) ×2 IMPLANT
TUBE CONNECTING 20X1/4 (TUBING) ×1 IMPLANT
YANKAUER SUCT BULB TIP NO VENT (SUCTIONS) ×1 IMPLANT

## 2015-08-28 NOTE — Anesthesia Preprocedure Evaluation (Signed)
Anesthesia Evaluation  Patient identified by MRN, date of birth, ID band Patient awake    Reviewed: Allergy & Precautions, Patient's Chart, lab work & pertinent test results  Airway Mallampati: I  TM Distance: >3 FB Neck ROM: Full    Dental   Pulmonary    Pulmonary exam normal        Cardiovascular Normal cardiovascular exam     Neuro/Psych    GI/Hepatic   Endo/Other    Renal/GU      Musculoskeletal   Abdominal   Peds  Hematology   Anesthesia Other Findings   Reproductive/Obstetrics                             Anesthesia Physical Anesthesia Plan  ASA: II  Anesthesia Plan: General   Post-op Pain Management: GA combined w/ Regional for post-op pain   Induction: Intravenous  Airway Management Planned: LMA  Additional Equipment:   Intra-op Plan:   Post-operative Plan: Extubation in OR  Informed Consent: I have reviewed the patients History and Physical, chart, labs and discussed the procedure including the risks, benefits and alternatives for the proposed anesthesia with the patient or authorized representative who has indicated his/her understanding and acceptance.     Plan Discussed with: CRNA and Surgeon  Anesthesia Plan Comments:         Anesthesia Quick Evaluation

## 2015-08-28 NOTE — Op Note (Signed)
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 After extensive preoperative discussion regarding the nature of surgery and the indications and risks detailed elsewhere we have elected to proceed with radioactive seed localized left breast lumpectomy and left axillary sentinel lymph node biopsy as initial surgical treatment.  Procedure Detail:  She had previously undergone accurate placement of a radioactive seed at the clip and tumor site. In the holding area she underwent a regional block by anesthesia and underwent injection of 1 mCi of technetium sulfur colloid intradermally around the left nipple. She was taken to the operating room, placed in the supine position on the operating table, and laryngeal mask general anesthesia induced. Under sterile technique after patient timeout I injected 5 mL of dilute methylene blue subcutaneously beneath the left nipple and massaged this for several minutes. The entire left chest and breast, axilla, and upper arm were widely sterilely prepped and draped. Patient timeout was again performed. She received preoperative IV antibiotics. I made a curvilinear incision at the areolar border in the upper outer quadrant. A skin is subcutaneous flap was then raised superiorly and laterally until I was over the seed confirmed with the neoprobe. Dissection was deepened down into the breast tissue toward the tumor. Using the neoprobe for guidance I excised a generous lumpectomy specimen around the area of high counts. There was also a subtle  palpable mass. This extended down toward the chest wall. The specimen was removed and measured about 5 cm in diameter. The specimen was inked for margins and specimen mammogram was obtained showing the clip and seed well contained within the specimen although a little closer to the superior and deep margins as was indicated by the neoprobe. I further excised the superior and deep margins back about half a centimeter which were oriented with ink and sent as separate specimens. Complete hemostasis was obtained and the wound irrigated. The lumpectomy cavity was marked with clips. The deep and subcutaneous tissue was closed with interrupted 3-0 Vicryl. Attention was turned to the sentinel lymph node biopsy. An area of high counts was identified in the axilla and a small transverse incision made. Dissection was carried down through the subtenon's tissue with cautery and the clavipectoral fascia incised. I immediately dissected down onto a slightly enlarged bright blue lymph node with very high counts. This was completely excised with cautery and ex vivo had counts of over 3000. It was sent as hot blue axillary sentinel lymph node. A second area of elevated counts was identified and with blunt dissection a small non-blue lymph node was identified and completely excised with ex vivo counts of over 800 and was sent as hot non-blue axillary sentinel lymph node. At this point there was minimal background activity in the axilla and no palpable abnormalities. Hemostasis was assured and the deep subcutaneous tissue closed with interrupted 3-0 Vicryl. Both skin incisions were closed with subcuticular 5-0 Monocryl and Dermabond. Sponge needle and instrument counts were correct.    Findings: As above  Estimated Blood Loss:  Minimal         Drains: none  Blood Given: none  Specimens: #1 left breast lumpectomy     #2 further superior margin oriented    #3 further deep margin oriented     #4 hot blue axillary  sentinel lymph node     #5 hot non-blue axillary sentinel lymph node        Complications:  * No complications entered in OR log *         Disposition: PACU - hemodynamically stable.         Condition: stable

## 2015-08-28 NOTE — Anesthesia Postprocedure Evaluation (Signed)
Anesthesia Post Note  Patient: Destri Rosenwinkel  Procedure(s) Performed: Procedure(s) (LRB): LEFT BREAST LUMPECTOMY WITH RADIOACTIVE SEED AND SENTINEL LYMPH NODE BIOPSY (Left)  Patient location during evaluation: PACU Anesthesia Type: General Level of consciousness: awake and alert Pain management: pain level controlled Vital Signs Assessment: post-procedure vital signs reviewed and stable Respiratory status: spontaneous breathing, nonlabored ventilation, respiratory function stable and patient connected to nasal cannula oxygen Cardiovascular status: blood pressure returned to baseline and stable Postop Assessment: no signs of nausea or vomiting Anesthetic complications: no    Last Vitals:  Filed Vitals:   08/28/15 1315 08/28/15 1342  BP: 113/85 122/84  Pulse: 75 74  Temp:  36.5 C  Resp: 15 20    Last Pain:  Filed Vitals:   08/28/15 1344  PainSc: Bertrand                 Niara Bunker DAVID

## 2015-08-28 NOTE — Interval H&P Note (Signed)
History and Physical Interval Note:  08/28/2015 10:48 AM  Ashley Tyler  has presented today for surgery, with the diagnosis of LEFT BREAST CANCER  The various methods of treatment have been discussed with the patient and family. After consideration of risks, benefits and other options for treatment, the patient has consented to  Procedure(s): LEFT BREAST LUMPECTOMY WITH RADIOACTIVE SEED AND SENTINEL LYMPH NODE BIOPSY (Left) as a surgical intervention .  The patient's history has been reviewed, patient examined, no change in status, stable for surgery.  I have reviewed the patient's chart and labs.  Questions were answered to the patient's satisfaction.     Jaydalee Bardwell T

## 2015-08-28 NOTE — Discharge Instructions (Signed)
[facility] °Office Phone Number [phone] ° °BREAST BIOPSY/ PARTIAL MASTECTOMY: POST OP INSTRUCTIONS ° °Always review your discharge instruction sheet given to you by the facility where your surgery was performed. ° °IF YOU HAVE DISABILITY OR FAMILY LEAVE FORMS, YOU MUST BRING THEM TO THE OFFICE FOR PROCESSING.  DO NOT GIVE THEM TO YOUR DOCTOR. ° °1. A prescription for pain medication may be given to you upon discharge.  Take your pain medication as prescribed, if needed.  If narcotic pain medicine is not needed, then you may take acetaminophen (Tylenol) or ibuprofen (Advil) as needed. °2. Take your usually prescribed medications unless otherwise directed °3. If you need a refill on your pain medication, please contact your pharmacy.  They will contact our office to request authorization.  Prescriptions will not be filled after 5pm or on week-ends. °4. You should eat very light the first 24 hours after surgery, such as soup, crackers, pudding, etc.  Resume your normal diet the day after surgery. °5. Most patients will experience some swelling and bruising in the breast.  Ice packs and a good support bra will help.  Swelling and bruising can take several days to resolve.  °6. It is common to experience some constipation if taking pain medication after surgery.  Increasing fluid intake and taking a stool softener will usually help or prevent this problem from occurring.  A mild laxative (Milk of Magnesia or Miralax) should be taken according to package directions if there are no bowel movements after 48 hours. °7. Unless discharge instructions indicate otherwise, you may remove your bandages 24-48 hours after surgery, and you may shower at that time.  You may have steri-strips (small skin tapes) in place directly over the incision.  These strips should be left on the skin for 7-10 days.  If your surgeon used skin glue on the incision, you may shower in 24 hours.  The glue will flake off over the  next 2-3 weeks.  Any sutures or staples will be removed at the office during your follow-up visit. °8. ACTIVITIES:  You may resume regular daily activities (gradually increasing) beginning the next day.  Wearing a good support bra or sports bra minimizes pain and swelling.  You may have sexual intercourse when it is comfortable. °a. You may drive when you no longer are taking prescription pain medication, you can comfortably wear a seatbelt, and you can safely maneuver your car and apply brakes. °b. RETURN TO WORK:  ______________________________________________________________________________________ °9. You should see your doctor in the office for a follow-up appointment approximately two weeks after your surgery.  Your doctor’s nurse will typically make your follow-up appointment when she calls you with your pathology report.  Expect your pathology report 2-3 business days after your surgery.  You may call to check if you do not hear from us after three days. °10. OTHER INSTRUCTIONS: _______________________________________________________________________________________________ _____________________________________________________________________________________________________________________________________ °_____________________________________________________________________________________________________________________________________ °_____________________________________________________________________________________________________________________________________ ° °WHEN TO CALL YOUR DOCTOR: °1. Fever over 101.0 °2. Nausea and/or vomiting. °3. Extreme swelling or bruising. °4. Continued bleeding from incision. °5. Increased pain, redness, or drainage from the incision. ° °The clinic staff is available to answer your questions during regular business hours.  Please don’t hesitate to call and ask to speak to one of the nurses for clinical concerns.  If you have a medical emergency, go to the nearest  emergency room or call 911.  A surgeon from [facility] is always on call at the hospital. ° °For further questions, please visit [email or web address]  ° ° ° °  Post Anesthesia Home Care Instructions ° °Activity: °Get plenty of rest for the remainder of the day. A responsible adult should stay with you for 24 hours following the procedure.  °For the next 24 hours, DO NOT: °-Drive a car °-Operate machinery °-Drink alcoholic beverages °-Take any medication unless instructed by your physician °-Make any legal decisions or sign important papers. ° °Meals: °Start with liquid foods such as gelatin or soup. Progress to regular foods as tolerated. Avoid greasy, spicy, heavy foods. If nausea and/or vomiting occur, drink only clear liquids until the nausea and/or vomiting subsides. Call your physician if vomiting continues. ° °Special Instructions/Symptoms: °Your throat may feel dry or sore from the anesthesia or the breathing tube placed in your throat during surgery. If this causes discomfort, gargle with warm salt water. The discomfort should disappear within 24 hours. ° °If you had a scopolamine patch placed behind your ear for the management of post- operative nausea and/or vomiting: ° °1. The medication in the patch is effective for 72 hours, after which it should be removed.  Wrap patch in a tissue and discard in the trash. Wash hands thoroughly with soap and water. °2. You may remove the patch earlier than 72 hours if you experience unpleasant side effects which may include dry mouth, dizziness or visual disturbances. °3. Avoid touching the patch. Wash your hands with soap and water after contact with the patch. °  ° °

## 2015-08-28 NOTE — H&P (Signed)
History of Present Illness (Benjamin T. Hoxworth MD; 08/09/2015 10:26 AM) Patient words: New breast cancer.  The patient is a 50 year old female who presents with breast cancer. She is a pre menopausal female referred by Dr. Maynard for evaluation of recently diagnosed carcinoma of the left breast. She recently presented for a screening mamogram revealing a probable mass in the left breast. Subsequent imaging included diagnostic mamogram showing a 2.3 cm spiculated mass in the upper outer left breast and ultrasound showing a 1.8 cm mass UO left breast at 1 o,clock 5 cm from the nipple. An ultrasound guided breast biopsy was performed on 08/03/2015 with pathology revealing invasive ductal carcinoma of the breast. She is seen now in the office for initial treatment planning. She has experienced no breast sy;mptoms, specifically pao-in, lump or discharge. She does not have a personal history of any previous breast problems.  Findings at that time were the following: Tumor size: 2.3 cm Tumor grade: 3 Estrogen Receptor: pos Progesterone Receptor: pos Her-2 neu: neg Lymph node status: neg    Other Problems (Emily Schmitz, CMA; 08/09/2015 9:46 AM) Back Pain Hemorrhoids Lump In Breast Thyroid Disease  Past Surgical History (Emily Schmitz, CMA; 08/09/2015 9:46 AM) Breast Biopsy Left. Colon Polyp Removal - Colonoscopy Tonsillectomy  Diagnostic Studies History (Emily Schmitz, CMA; 08/09/2015 9:46 AM) Colonoscopy within last year  Allergies (Emily Schmitz, CMA; 08/09/2015 9:47 AM) Tetracycline HCl *DERMATOLOGICALS*  Medication History (Emily Schmitz, CMA; 08/09/2015 9:47 AM) Vitamin C (100MG Tablet, Oral) Active. Ibuprofen (200MG Tablet, Oral) Active. Ultravate (0.05% Ointment, External) Active. Medications Reconciled  Social History (Emily Schmitz, CMA; 08/09/2015 9:46 AM) Alcohol use Occasional alcohol use. Caffeine use Tea. No drug use Tobacco use Never smoker.  Family  History (Emily Schmitz, CMA; 08/09/2015 9:46 AM) Arthritis Mother. Hypertension Mother. Migraine Headache Father. Prostate Cancer Father. Respiratory Condition Father.    Review of Systems (Emily Schmitz CMA; 08/09/2015 9:46 AM) General Present- Fatigue. Not Present- Appetite Loss, Chills, Fever, Night Sweats, Weight Gain and Weight Loss. Skin Present- Dryness. Not Present- Change in Wart/Mole, Hives, Jaundice, New Lesions, Non-Healing Wounds, Rash and Ulcer. HEENT Present- Seasonal Allergies and Wears glasses/contact lenses. Not Present- Earache, Hearing Loss, Hoarseness, Nose Bleed, Oral Ulcers, Ringing in the Ears, Sinus Pain, Sore Throat, Visual Disturbances and Yellow Eyes. Breast Present- Breast Mass. Not Present- Breast Pain, Nipple Discharge and Skin Changes. Cardiovascular Not Present- Chest Pain, Difficulty Breathing Lying Down, Leg Cramps, Palpitations, Rapid Heart Rate, Shortness of Breath and Swelling of Extremities. Gastrointestinal Present- Constipation and Hemorrhoids. Not Present- Abdominal Pain, Bloating, Bloody Stool, Change in Bowel Habits, Chronic diarrhea, Difficulty Swallowing, Excessive gas, Gets full quickly at meals, Indigestion, Nausea, Rectal Pain and Vomiting. Female Genitourinary Not Present- Blood in Urine, Change in Urinary Stream, Frequency, Impotence, Nocturia, Painful Urination, Urgency and Urine Leakage. Musculoskeletal Present- Back Pain. Not Present- Joint Pain, Joint Stiffness, Muscle Pain, Muscle Weakness and Swelling of Extremities. Neurological Not Present- Decreased Memory, Fainting, Headaches, Numbness, Seizures, Tingling, Tremor, Trouble walking and Weakness. Psychiatric Not Present- Anxiety, Bipolar, Change in Sleep Pattern, Depression, Fearful and Frequent crying. Endocrine Present- Hair Changes. Not Present- Cold Intolerance, Excessive Hunger, Heat Intolerance, Hot flashes and New Diabetes. Hematology Present- Easy Bruising. Not Present- Excessive  bleeding, Gland problems, HIV and Persistent Infections.  Vitals (Emily Schmitz CMA; 08/09/2015 9:48 AM) 08/09/2015 9:47 AM Weight: 176.8 lb Height: 65.5in Body Surface Area: 1.89 m Body Mass Index: 28.97 kg/m  Temp.: 97.9F  Pulse: 74 (Regular)  BP: 128/82 (Sitting, Left Arm, Standard)         Physical Exam (Benjamin T. Hoxworth MD; 08/09/2015 10:30 AM) The physical exam findings are as follows: Note:General: Alert, well-developed and well nourished African-American female, in no distress Skin: Warm and dry without rash or infection. HEENT: No palpable masses or thyromegaly. Sclera nonicteric. Lymph nodes: No cervical, supraclavicular, or inguinal nodes palpable. Breasts: Slight bruising and thickening upper outer left breast post biopsy. No other palpable abnormalities or skin changes or nipple discharge Lungs: Breath sounds clear and equal. No wheezing or increased work of breathing. Cardiovascular: Regular rate and rhythm without murmer. No JVD or edema. Abdomen: Nondistended. Soft and nontender. No masses palpable. No organomegaly. No palpable hernias. Extremities: No edema or joint swelling or deformity. No chronic venous stasis changes. Neurologic: Alert and fully oriented. Gait normal. No focal weakness. Psychiatric: Normal mood and affect. Thought content appropriate with normal judgement and insight    Assessment & Plan (Benjamin T. Hoxworth MD; 08/09/2015 10:33 AM) MALIGNANT NEOPLASM OF LEFT BREAST, STAGE 1 (C50.912) Impression: 50 year old female with a new diagnosis of cancer of the left breast, upper outer quadrant. Clinical stage Ia, ER positive, PR positive, HER-2 pending. I discussed with the patient and family members present today initial surgical treatment options. We discussed options of breast conservation with lumpectomy or total mastectomy and sentinal lymph node biopsy/dissection. I believe she would be a good candidate for breast conservation. After  discussion they have elected to proceed with breast conservation with lumpectomy and axillary sentinel lymph node biopsy.. We discussed the indications and nature of the procedure, and expected recovery, in detail. Surgical risks including anesthetic complications, cardiorespiratory complications, bleeding, infection, wound healing complications, blood clots, lymphedema, local and distant recurrence and possible need for further surgery based on the final pathology was discussed and understood. Chemotherapy, hormonal therapy and radiation therapy have been discussed. They have been provided with literature regarding the treatment of breast cancer. All questions were answered. They understand and agree to proceed and we will go ahead with scheduling. Current Plans Pt Education - [facility] Breast Cancer Information Given - [employer] "Breast Journey" Package Radioactive seed localized left breast lumpectomy and left axillary sentinel lymph node biopsy under general anesthesia as an outpatient 

## 2015-08-28 NOTE — Transfer of Care (Signed)
Immediate Anesthesia Transfer of Care Note  Patient: Ashley Tyler  Procedure(s) Performed: Procedure(s): LEFT BREAST LUMPECTOMY WITH RADIOACTIVE SEED AND SENTINEL LYMPH NODE BIOPSY (Left)  Patient Location: PACU  Anesthesia Type:General  Level of Consciousness: awake  Airway & Oxygen Therapy: Patient Spontanous Breathing and Patient connected to face mask oxygen  Post-op Assessment: Report given to RN and Post -op Vital signs reviewed and stable  Post vital signs: Reviewed and stable  Last Vitals:  Filed Vitals:   08/28/15 1015 08/28/15 1233  BP: 111/77 116/78  Pulse: 69 90  Temp:    Resp: 11     Complications: No apparent anesthesia complications

## 2015-08-28 NOTE — Progress Notes (Signed)
Assisted Dr. Conrad Talmage with left, ultrasound guided, transabdominal plane block. Side rails up, monitors on throughout procedure. See vital signs in flow sheet. Tolerated Procedure well.

## 2015-08-28 NOTE — Anesthesia Procedure Notes (Signed)
Procedure Name: LMA Insertion Date/Time: 08/28/2015 10:59 AM Performed by: Lieutenant Diego Pre-anesthesia Checklist: Patient identified, Emergency Drugs available, Suction available and Patient being monitored Patient Re-evaluated:Patient Re-evaluated prior to inductionOxygen Delivery Method: Circle System Utilized Preoxygenation: Pre-oxygenation with 100% oxygen Intubation Type: IV induction Ventilation: Mask ventilation without difficulty LMA: LMA inserted LMA Size: 4.0 Number of attempts: 1 Airway Equipment and Method: Bite block Placement Confirmation: positive ETCO2 and breath sounds checked- equal and bilateral Tube secured with: Tape Dental Injury: Teeth and Oropharynx as per pre-operative assessment

## 2015-08-29 ENCOUNTER — Encounter (HOSPITAL_BASED_OUTPATIENT_CLINIC_OR_DEPARTMENT_OTHER): Payer: Self-pay | Admitting: General Surgery

## 2015-09-07 ENCOUNTER — Encounter (HOSPITAL_BASED_OUTPATIENT_CLINIC_OR_DEPARTMENT_OTHER): Payer: Self-pay | Admitting: General Surgery

## 2015-09-12 ENCOUNTER — Other Ambulatory Visit: Payer: Self-pay | Admitting: *Deleted

## 2015-09-12 DIAGNOSIS — C50919 Malignant neoplasm of unspecified site of unspecified female breast: Secondary | ICD-10-CM

## 2015-09-13 ENCOUNTER — Ambulatory Visit (HOSPITAL_BASED_OUTPATIENT_CLINIC_OR_DEPARTMENT_OTHER): Payer: 59 | Admitting: Oncology

## 2015-09-13 ENCOUNTER — Encounter: Payer: Self-pay | Admitting: Oncology

## 2015-09-13 ENCOUNTER — Other Ambulatory Visit (HOSPITAL_BASED_OUTPATIENT_CLINIC_OR_DEPARTMENT_OTHER): Payer: 59

## 2015-09-13 VITALS — BP 118/73 | HR 72 | Temp 97.8°F | Resp 18 | Ht 65.5 in | Wt 176.8 lb

## 2015-09-13 DIAGNOSIS — Z170 Estrogen receptor positive status [ER+]: Secondary | ICD-10-CM | POA: Diagnosis not present

## 2015-09-13 DIAGNOSIS — C50412 Malignant neoplasm of upper-outer quadrant of left female breast: Secondary | ICD-10-CM

## 2015-09-13 DIAGNOSIS — C50919 Malignant neoplasm of unspecified site of unspecified female breast: Secondary | ICD-10-CM

## 2015-09-13 LAB — CBC WITH DIFFERENTIAL/PLATELET
BASO%: 0.1 % (ref 0.0–2.0)
Basophils Absolute: 0 10*3/uL (ref 0.0–0.1)
EOS%: 1.9 % (ref 0.0–7.0)
Eosinophils Absolute: 0.1 10*3/uL (ref 0.0–0.5)
HEMATOCRIT: 40.2 % (ref 34.8–46.6)
HEMOGLOBIN: 13 g/dL (ref 11.6–15.9)
LYMPH#: 2.3 10*3/uL (ref 0.9–3.3)
LYMPH%: 30.4 % (ref 14.0–49.7)
MCH: 27.7 pg (ref 25.1–34.0)
MCHC: 32.3 g/dL (ref 31.5–36.0)
MCV: 85.5 fL (ref 79.5–101.0)
MONO#: 0.3 10*3/uL (ref 0.1–0.9)
MONO%: 4.4 % (ref 0.0–14.0)
NEUT%: 63.2 % (ref 38.4–76.8)
NEUTROS ABS: 4.7 10*3/uL (ref 1.5–6.5)
PLATELETS: 312 10*3/uL (ref 145–400)
RBC: 4.7 10*6/uL (ref 3.70–5.45)
RDW: 14.1 % (ref 11.2–14.5)
WBC: 7.5 10*3/uL (ref 3.9–10.3)

## 2015-09-13 LAB — COMPREHENSIVE METABOLIC PANEL
ALT: 38 U/L (ref 0–55)
ANION GAP: 8 meq/L (ref 3–11)
AST: 23 U/L (ref 5–34)
Albumin: 3.7 g/dL (ref 3.5–5.0)
Alkaline Phosphatase: 79 U/L (ref 40–150)
BUN: 16.1 mg/dL (ref 7.0–26.0)
CHLORIDE: 106 meq/L (ref 98–109)
CO2: 26 meq/L (ref 22–29)
CREATININE: 1 mg/dL (ref 0.6–1.1)
Calcium: 9.4 mg/dL (ref 8.4–10.4)
EGFR: 73 mL/min/{1.73_m2} — ABNORMAL LOW (ref >90)
GLUCOSE: 85 mg/dL (ref 70–140)
Potassium: 3.9 mEq/L (ref 3.5–5.1)
Sodium: 139 mEq/L (ref 136–145)
Total Bilirubin: 0.49 mg/dL (ref 0.20–1.20)
Total Protein: 7.4 g/dL (ref 6.4–8.3)

## 2015-09-13 NOTE — Progress Notes (Signed)
[facility]  [phone] [phone]     ID: Ashley Tyler DOB: 01-21-1966  MR#: [id number]  OHY#:[id number]  Patient Care Team: No Pcp Per Patient as PCP - General (General Practice) Chauncey Cruel, MD as Consulting Physician (Oncology) [name], MD as Consulting Physician (General Surgery) [name], MD as Attending Physician (Radiation Oncology) PCP: No PCP Per Patient GYN: OTHER MD:  CHIEF COMPLAINT: Estrogen receptor positive breast cancer  CURRENT TREATMENT: Awaiting adjuvant therapy   BREAST CANCER HISTORY: Ashley Tyler had screening mammography at Dr. Caralee Ates office 07/24/2015 suggesting a left breast mass and the patient was referred to the [facility] where on 07/30/2015 she underwent left diagnostic mammography with tomosynthesis and left breast ultrasonography. The breast density was category C. In the upper outer quadrant of the left breast there was a 2.3 cm spiculated mass. There were no associated calcifications. This mass was palpable at the 1:00 position 5 cm from the nipple. Ultrasound confirmed an irregular hypoechoic mass measuring 1.8 cm. Ultrasonography of the left axilla was negative.  On 08/03/2015 the patient underwent biopsy of the left breast mass in question. 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  The patient then met with surgery and after appropriate discussion she proceeded to left lumpectomy and sentinel lymph node sampling 08/28/2015. The final pathology ([id number]) showed invasive ductal carcinoma, grade 3, measuring 2.1 cm. Margins were close but negative. All 3 sentinel lymph nodes were clear. Repeat HER-2 was again negative, with a signals ratio of 0.9,  and number per cell 2.20.  INTERVAL HISTORY: Ashley Tyler was evaluated in the breast cancer clinic 09/13/2015 accompanied by her daughter Ashley Tyler area Her case was also presented in the multidisciplinary breast cancer conference 09/05/2015. At that time a preliminary plan was proposed: Oncotype was discussed but patient is triple negative; chemotherapy was recommended OO Schuetz given her age and a triple chemotherapy, and consideration of the PALLAS trial. The margins were felt to be fine as far as radiation was concerned  REVIEW OF SYSTEMS: There were no specific symptoms leading to the original mammogram, which was routinely scheduled. The patient denies unusual headaches, visual changes, nausea, vomiting, stiff neck, dizziness, or gait imbalance. There has been no cough, phlegm production, or pleurisy, no chest pain or pressure, and no change in bowel or bladder habits. The patient denies fever, rash, bleeding, unexplained fatigue or unexplained weight loss. She had a brief episode of nausea earlier this week, with no vomiting. That has resolved. A detailed review of systems was otherwise entirely negative.  PAST MEDICAL HISTORY: Past Medical History  Diagnosis Date  . Headache   . Cancer ([place])     left breast  . Endometriosis   . Migraines   . Chronic constipation     PAST SURGICAL HISTORY: Past Surgical History  Procedure Laterality Date  . Cesarean section    . Tonsillectomy    . Laparoscopic salpingo oopherectomy Right   . Breast lumpectomy with radioactive seed and sentinel lymph node biopsy Left 08/28/2015    Procedure: LEFT BREAST LUMPECTOMY WITH RADIOACTIVE SEED AND SENTINEL LYMPH NODE BIOPSY;  Surgeon: [name], MD;  Location: [facility];  Service: General;  Laterality: Left;    FAMILY HISTORY Family History  Problem Relation Age of Onset  .  Breast cancer Maternal Aunt     dx in her 91s  The patient's parents are still living, in their early 56s as of  April 2016. The patient is an only child. On her father's side there are 4 cases of breast cancer, 2 aunts diagnosed in their 75s and 51s, and 2 cousins diagnosed in her 29s and one at age 52. The patient's father had prostate cancer diagnosed age 36.  GYNECOLOGIC HISTORY:  No LMP recorded. Menarche age 55, first live birth age 61. The patient is GX P2, although both children were born early. She is status post right salpingo-oophorectomy due to endometriosis. 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 At home is just she and her daughter Ashley Tyler, 64. Her other daughter, Ashley Tyler, is studying criminal justice at [employer]: Not in place   HEALTH MAINTENANCE: Social History  Substance Use Topics  . Smoking status: Never Smoker   . Smokeless tobacco: Not on file  . Alcohol Use: Yes     Comment: social     Colonoscopy:  PAP:  Bone density:  Lipid panel:  Allergies  Allergen Reactions  . Tetracyclines & Related Itching  . Other Rash    Powder in gloves causes rash    Current Outpatient Prescriptions  Medication Sig Dispense Refill  . Ascorbic Acid (VITAMIN C PO) Take 1 tablet by mouth daily.    . Halobetasol Oint &Lactic Ac Cr (ULTRAVATE X, OINTMENT,) 0.05 & 10 % KIT Apply 1 application topically daily as needed (eczema).    [name] ibuprofen (ADVIL,MOTRIN) 200 MG tablet Take 400 mg by mouth daily as needed for headache.    . IODINE, KELP, PO Take by mouth 2 (two) times daily.    [name] UNABLE TO FIND Pt on multiple supplements - theraxym, turmero, burdock complex,  Herbal tonic ( calendula, poke root, echinacia, atstragulus ), withania complex, Vitanox, Herbavital, AC Glutahthione, Glutihione recycler  Above prescribed by [facility]  Dr Salomon Fick Ward     No current facility-administered medications for this visit.     OBJECTIVE: Middle-aged African-American woman who appears stated age 50 Vitals:   09/13/15 1606  BP: 118/73  Pulse: 72  Temp: 97.8 F (36.6 C)  Resp: 18     Body mass index is 28.96 kg/(m^2).    ECOG FS:1 - Symptomatic but completely ambulatory  Ocular: Sclerae unicteric, pupils equal, round and reactive to light Ear-nose-throat: Oropharynx clear and moist Lymphatic: No cervical or supraclavicular adenopathy Lungs no rales or rhonchi, good excursion bilaterally Heart regular rate and rhythm, no murmur appreciated Abd soft, nontender, positive bowel sounds MSK no focal spinal tenderness, no joint edema Neuro: non-focal, well-oriented, appropriate affect Breasts: The right breast is unremarkable. The left breast is status post lumpectomy. The incisions are healing nicely. There is no evidence of dehiscence, swelling, or erythema. The left axilla is benign.  LAB RESULTS:  CMP     Component Value Date/Time   NA 139 09/13/2015 1550   NA 141 11/13/2013 2008   K 3.9 09/13/2015 1550   K 4.0 11/13/2013 2008   CL 103 11/13/2013 2008   CO2 26 09/13/2015 1550   CO2 25 11/13/2013 2008   GLUCOSE 85 09/13/2015 1550   GLUCOSE 91 11/13/2013 2008   BUN 16.1 09/13/2015 1550   BUN 13 11/13/2013 2008   CREATININE  1.0 09/13/2015 1550   CREATININE 1.05 11/13/2013 2008   CALCIUM 9.4 09/13/2015 1550   CALCIUM 9.0 11/13/2013 2008   PROT 7.4 09/13/2015 1550   ALBUMIN 3.7 09/13/2015 1550   AST 23 09/13/2015 1550   ALT 38 09/13/2015 1550   ALKPHOS 79 09/13/2015 1550   BILITOT 0.49 09/13/2015 1550   GFRNONAA 61* 11/13/2013 2008   GFRAA 71* 11/13/2013 2008    INo results found for: SPEP, UPEP  Lab Results  Component Value Date   WBC 7.5 09/13/2015   NEUTROABS 4.7 09/13/2015   HGB 13.0 09/13/2015   HCT 40.2 09/13/2015   MCV 85.5 09/13/2015   PLT 312 09/13/2015      Chemistry      Component Value Date/Time   NA 139 09/13/2015 1550   NA 141 11/13/2013 2008   K 3.9 09/13/2015  1550   K 4.0 11/13/2013 2008   CL 103 11/13/2013 2008   CO2 26 09/13/2015 1550   CO2 25 11/13/2013 2008   BUN 16.1 09/13/2015 1550   BUN 13 11/13/2013 2008   CREATININE 1.0 09/13/2015 1550   CREATININE 1.05 11/13/2013 2008      Component Value Date/Time   CALCIUM 9.4 09/13/2015 1550   CALCIUM 9.0 11/13/2013 2008   ALKPHOS 79 09/13/2015 1550   AST 23 09/13/2015 1550   ALT 38 09/13/2015 1550   BILITOT 0.49 09/13/2015 1550       No results found for: LABCA2  No components found for: LABCA125  No results for input(s): INR in the last 168 hours.  Urinalysis    Component Value Date/Time   COLORURINE YELLOW 11/13/2013 2030   APPEARANCEUR CLOUDY* 11/13/2013 2030   LABSPEC 1.013 11/13/2013 2030   PHURINE 5.5 11/13/2013 2030   GLUCOSEU NEGATIVE 11/13/2013 2030   [place] NEGATIVE 11/13/2013 2030   [place] NEGATIVE 11/13/2013 2030   KETONESUR 15* 11/13/2013 2030   PROTEINUR NEGATIVE 11/13/2013 2030   UROBILINOGEN 0.2 11/13/2013 2030   NITRITE NEGATIVE 11/13/2013 2030   LEUKOCYTESUR TRACE* 11/13/2013 2030      ELIGIBLE FOR AVAILABLE RESEARCH PROTOCOL: PALLAS  STUDIES: Nm Sentinel Node Inj-no Rpt (breast)  08/28/2015  CLINICAL DATA: cancer left breast Sulfur colloid was injected intradermally by the nuclear medicine technologist for breast cancer sentinel node localization.   Mm Breast Surgical Specimen  08/28/2015  CLINICAL DATA:  Status post excision of a left breast lesion, localized earlier with a radioactive seed. EXAM: SPECIMEN RADIOGRAPH OF THE LEFT BREAST COMPARISON:  Previous exam(s). FINDINGS: Status post excision of the left breast. The radioactive seed and biopsy marker clip are present, completely intact, and were marked for pathology. IMPRESSION: Specimen radiograph of the left breast. Electronically Signed   By: Lajean Manes M.D.   On: 08/28/2015 11:48   Mm Digital Diagnostic Unilat L  08/24/2015  CLINICAL DATA:  50 year old female with recently diagnosed  left breast cancer. Post ultrasound-guided radioactive seed placement left breast. EXAM: DIAGNOSTIC LEFT MAMMOGRAM POST ULTRASOUND-GUIDED RADIOACTIVE SEED PLACEMENT COMPARISON:  Previous exam(s). FINDINGS: Mammographic images were obtained following ultrasound-guided radioactive seed placement. These demonstrate a at the radioactive seed to be placed centrally within the left breast mass and adjacent to the ribbon shaped biopsy marking clip. IMPRESSION: Appropriate location of the radioactive seed. Final Assessment: Post Procedure Mammograms for Seed Placement Electronically Signed   By: Everlean Alstrom M.D.   On: 08/24/2015 15:14   [place] Lt Radioactive Seed Loc  08/24/2015  CLINICAL DATA:  50 year old female with recently diagnosed invasive cancer of  the left breast presents for preoperative radioactive seed localization of the left breast mass. EXAM: ULTRASOUND GUIDED RADIOACTIVE SEED LOCALIZATION OF THE LEFT BREAST COMPARISON:  Previous exam(s). FINDINGS: Patient presents for radioactive seed localization prior to left breast lumpectomy. I met with the patient and we discussed the procedure of seed localization including benefits and alternatives. We discussed the high likelihood of a successful procedure. We discussed the risks of the procedure including infection, bleeding, tissue injury and further surgery. We discussed the low dose of radioactivity involved in the procedure. Informed, written consent was given. The usual time-out protocol was performed immediately prior to the procedure. Using ultrasound guidance, sterile technique, 2% lidocaine and an I-125 radioactive seed, the mass with the associated ribbon shaped biopsy marking clip was localized using a lateral to medial approach. The follow-up mammogram images confirm the seed in the expected location and were marked for Dr. [name]. Follow-up survey of the patient confirms presence of the radioactive seed. Order number of I-125 seed:  [id number].  Total activity:  0.249 mCi         Reference Date: 08/14/2015 The patient tolerated the procedure well and was released from the [facility]. She was given instructions regarding seed removal. IMPRESSION: Radioactive seed localization left breast. No apparent complications. Electronically Signed   By: Everlean Alstrom M.D.   On: 08/24/2015 14:46    ASSESSMENT: 50 y.o. [place] woman status post left breast upper outer quadrant biopsy 08/03/2015 for a clinical T2 N0, stage IIa invasive ductal carcinoma, grade 3, estrogen and progesterone receptor positive, HER-2 not amplified, with an MIB-1 of 70%  (1) status post left lumpectomy and sentinel lymph node sampling 08/28/2015 for a pT2 pN0, stage IIA invasive ductal carcinoma, grade 3, with close but negative margins. Repeat HER-2 was again negative  (a) oncotype is pending  (2) adjuvant chemotherapy to consist of doxorubicin and cyclophosphamide 4 in dose dense fashion followed by weekly paclitaxel 12  (3) adjuvant radiation to follow chemotherapy  (4) adjuvant anti-estrogens to follow radiation, with consideration of the PALLAS trial   (5) genetics testing pending    PLAN: We spent the better part of today's hour-long appointment discussing the biology of breast cancer in general, and the specifics of the patient's tumor in particular. Ashley Tyler understands the difference between local and systemic therapy for breast cancer. She has already had her surgery, and is scheduled to meet with radiation oncology next week to "clean out" the tumor bed area.  She understands her cancer, even though it is fast growing, has been in place at least 2 years and possibly longer. There has been time for it to have spread to other parts of her body, not by way of the lymph system, but by way of the blood. Unfortunately we do not have tests that can completely rule out this possibility. The best scans we have are not able to detect cancer which is as small as 1  mm, and already has more than 1 million cells in it. For that reason and because of the risk of radiation exposure and false positive tests we do not recommend scans.  This risk of systemic spread already having occurred prior to her surgery is what motivates the need for systemic therapy. She qualifies for anti-estrogens and also for chemotherapy. She is not a candidate for anti-HER-2 treatment.  Because I had misread the pathology report I discussed chemotherapy in the setting of triple negative disease. This essentially gave Ashley Tyler no other options. I recommended  Ashley Tyler and Cytoxan followed by paclitaxel. We discussed the possible toxicities side effects and complications of these agents in detail.  Shortly after Naturi left as I reviewed her data for this dictation and realized her tumor was actually estrogen receptor positive. I called her, apologized for the mistake and offered to send an Oncotype so she would have a quantitative understanding of the possible benefits of adding chemotherapy to anti-estrogens. She is very much in favor of that and I have gone ahead and placed the order. We should have the results within 2 weeks.  Asher qualifies for genetic testing and this is being scheduled.  If she does decide to undergo chemotherapy, she would like to continue to work if possible so we would schedule her on a Thursday and she should be able to get back to work on a Monday.  At this point I am making a return appointment for [place] in about 2 weeks, by which time we should have the Oncotype results and can discuss the chemotherapy question more numerically. If she decides to proceed with chemotherapy, which is my recommendation at this point, that would precede radiation. Otherwise she would start radiation treatments and then start anti-estrogens.  Wrigley knows knows the goal of treatment in her case is cure. She will call with any problems that may develop before her next visit  here.  Chauncey Cruel, MD   09/13/2015 5:25 PM Medical Oncology and Hematology [facility] [address] [place],  [address] Tel. [phone]    Fax. [phone]

## 2015-09-13 NOTE — Progress Notes (Signed)
Location of Breast Cancer: Left Breast  Histology per Pathology Report:   08/03/15 Diagnosis Breast, left, needle core biopsy, 1:00 o'clock - INVASIVE DUCTAL CARCINOMA, GRADE 3.  08/28/15 Diagnosis 1. Breast, lumpectomy, Left - INVASIVE DUCTAL CARCINOMA, GRADE III/III, SPANNING 2.1 CM - DUCTAL CARCINOMA IN SITU, INTERMEDIATE GRADE. - INVASIVE DUCTAL CARCINOMA IS BROADLY LESS THAN 0.1 CM TO THE POSTERIOR MARGIN OF SPECIMEN #1. - INVASIVE DUCTAL CARCINOMA IS FOCALLY LESS THAN 0.1 CM TO THE MEDIAL MARGIN OF SPECIMEN #1. - SEE ONCOLOGY TABLE BELOW. 2. Breast, excision, Left, further posterior margin - BENIGN BREAST PARENCHYMA. - THERE IS NO EVIDENCE OF MALIGNANCY. - SEE COMMENT. 3. Breast, excision, Left, further superior margin - BENIGN BREAST PARENCHYMA. - THERE IS NO EVIDENCE OF MALIGNANCY. - SEE COMMENT. 4. Lymph node, sentinel, biopsy, Left Axillary Sentinel #1 - THERE IS NO EVIDENCE OF CARCINOMA IN 1 OF 1 LYMPH NODE (0/1). 5. Lymph node, sentinel, biopsy, Left Axillary Sentinel #2 - THERE IS NO EVIDENCE OF CARCINOMA IN 1 OF 1 LYMPH NODE (0/1). 6. Lymph node, sentinel, biopsy, Left Axillary Sentinel #3 - THERE IS NO EVIDENCE OF CARCINOMA IN 1 OF 1 LYMPH NODE (0/1).   Receptor Status: ER(60%), PR (20%), Her2-neu (NEG), Ki-(70%)  Did patient present with symptoms or was this found on screening mammography?: It was found on a screening mammogram.  Past/Anticipated interventions by surgeon, if any: 08/28/15 LEFT BREAST LUMPECTOMY WITH RADIOACTIVE SEED AND SENTINEL LYMPH NODE BIOPSY by Dr. [name]   Past/Anticipated interventions by medical oncology, if any:  She saw Dr. [name] 09/13/15 and he recommends:  1. adjuvant chemotherapy to consist of doxorubicin and cyclophosphamide 4 in dose dense fashion followed by weekly paclitaxel 12   2. adjuvant radiation to follow chemotherapy   3. adjuvant anti-estrogens to follow radiation, with consideration of the PALLAS trial     Lymphedema issues, if any:  Her breast is swollen after her lumpectomy but it does not continue to her arm.  Pain issues, if any: She complains of pain over her Left Breast, when she lays a certain way. She is not taking anything for this pain.   SAFETY ISSUES:  Prior radiation? No  Pacemaker/ICD? No  Possible current pregnancy? No  Is the patient on methotrexate? No  Current Complaints / other details:    BP 117/73 mmHg  Pulse 60  Temp(Src) 97.8 F (36.6 C)  Ht 5' 5.5" (1.664 m)  Wt 178 lb 9.6 oz (81.012 kg)  BMI 29.26 kg/m2    Dacey Milberger, Stephani Police, RN 09/13/2015,1:38 PM

## 2015-09-14 ENCOUNTER — Telehealth: Payer: Self-pay | Admitting: *Deleted

## 2015-09-14 NOTE — Telephone Encounter (Signed)
Received order per Dr. Magrinat for oncotype testing. Requisition sent to pathology. Received by Keisha. 

## 2015-09-17 ENCOUNTER — Other Ambulatory Visit: Payer: Self-pay | Admitting: Oncology

## 2015-09-17 ENCOUNTER — Telehealth: Payer: Self-pay | Admitting: *Deleted

## 2015-09-17 ENCOUNTER — Telehealth: Payer: Self-pay | Admitting: Oncology

## 2015-09-17 NOTE — Telephone Encounter (Signed)
appt made and staff message sent to override GM 5/1 sch at 815 am. Pt will be notified when done

## 2015-09-17 NOTE — Telephone Encounter (Signed)
  Oncology Nurse Navigator Documentation  Navigator Location: [facility]-Med Onc (09/17/15 1000) Navigator Encounter Type: Introductory phone call (09/17/15 1000)   Abnormal Finding Date: 07/30/15 (09/17/15 1000) Confirmed Diagnosis Date: 08/03/15 (09/17/15 1000) Surgery Date: 08/28/15 (09/17/15 1000) Treatment Initiated Date: 08/28/15 (09/17/15 1000) Patient Visit Type: MedOnc;Initial (09/13/15) (09/17/15 1000) Treatment Phase: Pre-Tx/Tx Discussion (09/17/15 1000) Barriers/Navigation Needs: Education (09/17/15 1000) Education: Newly Diagnosed Cancer Education (09/17/15 1000) Interventions: Education Method (09/17/15 1000)     Education Method: Teach-back (09/17/15 1000)      Acuity: Level 2 (09/17/15 1000)         Time Spent with Patient: 30 (09/17/15 1000)

## 2015-09-19 ENCOUNTER — Encounter: Payer: Self-pay | Admitting: Radiation Oncology

## 2015-09-19 ENCOUNTER — Ambulatory Visit
Admission: RE | Admit: 2015-09-19 | Discharge: 2015-09-19 | Disposition: A | Discharge status: Home / Self Care | Payer: 59 | Source: Ambulatory Visit | Attending: Radiation Oncology | Admitting: Radiation Oncology

## 2015-09-19 VITALS — BP 117/73 | HR 60 | Temp 97.8°F | Ht 65.5 in | Wt 178.6 lb

## 2015-09-19 DIAGNOSIS — Z170 Estrogen receptor positive status [ER+]: Secondary | ICD-10-CM | POA: Insufficient documentation

## 2015-09-19 DIAGNOSIS — C50412 Malignant neoplasm of upper-outer quadrant of left female breast: Secondary | ICD-10-CM | POA: Diagnosis present

## 2015-09-19 NOTE — Progress Notes (Signed)
Radiation Oncology         [phone]) [phone] ________________________________  Initial outpatient Consultation  Name: Ashley Tyler MRN: [id number]  Date: 09/19/2015  DOB: Nov 10, 1965  CC:No PCP Per Patient  [name], MD   REFERRING PHYSICIAN: [name], MD  DIAGNOSIS:    ICD-9-CM ICD-10-CM   1. 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 female who presented with 2.3 cm mass the left breast on screening mammogram.  Biopsy showed Grade III IDC, ER60%, PR 20%, HER2 neg.  There were no malignant appearing calcifications. Axilla negative by [place].  Left lumpectomy and SLN biopsy on 08-28-15 by Dr [name] revealed a 2.1 cm tumor with histology as above in the diagnosis. There was also DCIS in the specimen.   The margins are close to invasive disease but negative overall.  All 3 nodes are negative.   She denies chance of pregnancy. Genetic testing results are pending. Significant history of breast cancer on her paternal side.  Past/Anticipated interventions by medical oncology, if any:  She saw Dr. [name] 09/13/15 and he recommends (pending oncotype results):  1. adjuvant chemotherapy to consist of doxorubicin and cyclophosphamide 4 in dose dense fashion followed by weekly paclitaxel 12   2. adjuvant radiation to follow chemotherapy   3. adjuvant anti-estrogens to follow radiation, with consideration of the PALLAS trial   Lymphedema issues, if any:  Her breast is swollen after her lumpectomy but it does not continue to her arm.  Pain issues, if any: She complains of pain over her Left Breast, when she lays a certain way. She is not taking anything for this pain.   SAFETY ISSUES:  Prior radiation? No  Pacemaker/ICD? No  Possible  current pregnancy? No Is the patient on methotrexate? No   She is a Marine scientist.  PREVIOUS RADIATION THERAPY: No  PAST MEDICAL HISTORY:  has a past medical history of Headache; Cancer ([place]); Endometriosis; Migraines; and Chronic constipation.    PAST SURGICAL HISTORY: Past Surgical History  Procedure Laterality Date  . Cesarean section    . Tonsillectomy    . Laparoscopic salpingo oopherectomy Right   . Breast lumpectomy with radioactive seed and sentinel lymph node biopsy Left 08/28/2015    Procedure: LEFT BREAST LUMPECTOMY WITH RADIOACTIVE SEED AND SENTINEL LYMPH NODE BIOPSY;  Surgeon: [name], MD;  Location: [facility];  Service: General;  Laterality: Left;    FAMILY HISTORY: family history includes Breast cancer in her maternal aunt.  SOCIAL HISTORY:  reports that she has never smoked. She does not have any smokeless tobacco history on file. She reports that she drinks alcohol. She reports that she does not use illicit drugs.  ALLERGIES: Tetracyclines & related and Other  MEDICATIONS:  Current Outpatient Prescriptions  Medication Sig Dispense Refill  . Ascorbic Acid (VITAMIN C PO) Take 1 tablet by mouth daily.    . Halobetasol Oint &Lactic Ac Cr (ULTRAVATE X, OINTMENT,) 0.05 & 10 % KIT Apply 1 application topically daily as needed (eczema).    [name] ibuprofen (ADVIL,MOTRIN) 200 MG tablet Take 400 mg by mouth daily as needed for headache.    . IODINE, KELP, PO Take by mouth 2 (two) times daily.    [name] UNABLE TO FIND Pt on multiple supplements - theraxym, turmero, burdock complex,  Herbal tonic ( calendula, poke root, echinacia, atstragulus ), withania complex, Vitanox, Herbavital, AC Glutahthione, Glutihione recycler  Above prescribed by [facility]  Dr Salomon Fick Ward     No current facility-administered medications for this encounter.    REVIEW OF SYSTEMS:  Notable for that above.   PHYSICAL EXAM:  height is 5' 5.5" (1.664 m) and weight is  178 lb 9.6 oz (81.012 kg). Her temperature is 97.8 F (36.6 C). Her blood pressure is 117/73 and her pulse is 60.   General: Alert and oriented, tearful at times HEENT: Head is normocephalic. Extraocular movements are intact. Oropharynx is clear. Neck: Neck is supple, no palpable cervical or supraclavicular lymphadenopathy. Heart: Regular in rate and rhythm with no murmurs, rubs, or gallops. Chest: Clear to auscultation bilaterally, with no rhonchi, wheezes, or rales. Abdomen: Soft, nontender, nondistended, with no rigidity or guarding. Extremities: No cyanosis or edema. Lymphatics: see Neck Exam Skin: No concerning lesions. Musculoskeletal: ambulatory Neurologic: Cranial nerves II through XII are grossly intact. No obvious focalities. Speech is fluent. Coordination is intact. Psychiatric: Judgment and insight are intact. Affect is appropriate. Breasts: Seroma in left breast. Left breast/axillary scars are healing well from surgery. No other palpable masses appreciated in the breasts or axillae .   ECOG = 0  0 - Asymptomatic (Fully active, able to carry on all predisease activities without restriction)  1 - Symptomatic but completely ambulatory (Restricted in physically strenuous activity but ambulatory and able to carry out work of a light or sedentary nature. For example, light housework, office work)  2 - Symptomatic, <50% in bed during the day (Ambulatory and capable of all self care but unable to carry out any work activities. Up and about more than 50% of waking hours)  3 - Symptomatic, >50% in bed, but not bedbound (Capable of only limited self-care, confined to bed or chair 50% or more of waking hours)  4 - Bedbound (Completely disabled. Cannot carry on any self-care. Totally confined to bed or chair)  5 - Death   Eustace Pen MM, Creech RH, Tormey DC, et al. [phone]). "Toxicity and response criteria of the [facility]". [place] Oncol. 5 (6):  649-55   LABORATORY DATA:  Lab Results  Component Value Date   WBC 7.5 09/13/2015   HGB 13.0 09/13/2015   HCT 40.2 09/13/2015   MCV 85.5 09/13/2015   PLT 312 09/13/2015   CMP     Component Value Date/Time   NA 139 09/13/2015 1550   NA 141 11/13/2013 2008   K 3.9 09/13/2015 1550   K 4.0 11/13/2013 2008   CL 103 11/13/2013 2008   CO2 26 09/13/2015 1550   CO2 25 11/13/2013 2008   GLUCOSE 85 09/13/2015 1550   GLUCOSE 91 11/13/2013 2008   BUN 16.1 09/13/2015 1550   BUN 13 11/13/2013 2008   CREATININE 1.0 09/13/2015 1550   CREATININE 1.05 11/13/2013 2008   CALCIUM 9.4 09/13/2015 1550   CALCIUM 9.0 11/13/2013 2008   PROT 7.4 09/13/2015 1550   ALBUMIN 3.7 09/13/2015 1550   AST 23 09/13/2015 1550   ALT 38 09/13/2015 1550   ALKPHOS 79 09/13/2015 1550   BILITOT 0.49 09/13/2015 1550   GFRNONAA 61* 11/13/2013 2008   GFRAA 71* 11/13/2013 2008         RADIOGRAPHY: Nm Sentinel Node Inj-no Rpt (breast)  08/28/2015  CLINICAL DATA: cancer left breast Sulfur colloid was injected intradermally by the nuclear medicine technologist for breast cancer  sentinel node localization.   Mm Breast Surgical Specimen  08/28/2015  CLINICAL DATA:  Status post excision of a left breast lesion, localized earlier with a radioactive seed. EXAM: SPECIMEN RADIOGRAPH OF THE LEFT BREAST COMPARISON:  Previous exam(s). FINDINGS: Status post excision of the left breast. The radioactive seed and biopsy marker clip are present, completely intact, and were marked for pathology. IMPRESSION: Specimen radiograph of the left breast. Electronically Signed   By: Lajean Manes M.D.   On: 08/28/2015 11:48   Mm Digital Diagnostic Unilat L  08/24/2015  CLINICAL DATA:  50 year old female with recently diagnosed left breast cancer. Post ultrasound-guided radioactive seed placement left breast. EXAM: DIAGNOSTIC LEFT MAMMOGRAM POST ULTRASOUND-GUIDED RADIOACTIVE SEED PLACEMENT COMPARISON:  Previous exam(s). FINDINGS: Mammographic  images were obtained following ultrasound-guided radioactive seed placement. These demonstrate a at the radioactive seed to be placed centrally within the left breast mass and adjacent to the ribbon shaped biopsy marking clip. IMPRESSION: Appropriate location of the radioactive seed. Final Assessment: Post Procedure Mammograms for Seed Placement Electronically Signed   By: Everlean Alstrom M.D.   On: 08/24/2015 15:14   [place] Lt Radioactive Seed Loc  08/24/2015  CLINICAL DATA:  50 year old female with recently diagnosed invasive cancer of the left breast presents for preoperative radioactive seed localization of the left breast mass. EXAM: ULTRASOUND GUIDED RADIOACTIVE SEED LOCALIZATION OF THE LEFT BREAST COMPARISON:  Previous exam(s). FINDINGS: Patient presents for radioactive seed localization prior to left breast lumpectomy. I met with the patient and we discussed the procedure of seed localization including benefits and alternatives. We discussed the high likelihood of a successful procedure. We discussed the risks of the procedure including infection, bleeding, tissue injury and further surgery. We discussed the low dose of radioactivity involved in the procedure. Informed, written consent was given. The usual time-out protocol was performed immediately prior to the procedure. Using ultrasound guidance, sterile technique, 2% lidocaine and an I-125 radioactive seed, the mass with the associated ribbon shaped biopsy marking clip was localized using a lateral to medial approach. The follow-up mammogram images confirm the seed in the expected location and were marked for Dr. [name]. Follow-up survey of the patient confirms presence of the radioactive seed. Order number of I-125 seed:  [id number]. Total activity:  0.249 mCi         Reference Date: 08/14/2015 The patient tolerated the procedure well and was released from the [facility]. She was given instructions regarding seed removal. IMPRESSION: Radioactive  seed localization left breast. No apparent complications. Electronically Signed   By: Everlean Alstrom M.D.   On: 08/24/2015 14:46      IMPRESSION/PLAN: Stage II Left breast cancer   It was a pleasure meeting the patient today. We discussed the risks, benefits, and side effects of radiotherapy. I recommend radiotherapy to the left breast to reduce her risk of locoregional recurrence by 2/3.  We discussed that radiation would take approximately 4-6  weeks to complete and that I would give the patient a few weeks to heal following surgery before starting treatment planning.  If chemotherapy were to be given, this would precede radiotherapy. We spoke about acute effects including skin irritation and fatigue as well as much less common late effects including internal organ injury or irritation. We spoke about the latest technology that is used to minimize the risk of late effects for patients undergoing radiotherapy to the breast. No guarantees of treatment were given. The patient is enthusiastic about proceeding with treatment. I look forward to participating  in the patient's care. Consent signed today; simulation scheduling will depend on when she is released by medical oncology.   __________________________________________   [name], MD

## 2015-09-19 NOTE — Addendum Note (Signed)
Encounter addended by: Ernst Spell, RN on: 09/19/2015 11:28 AM<BR>     Documentation filed: Charges VN

## 2015-09-21 ENCOUNTER — Encounter: Payer: Self-pay | Admitting: Oncology

## 2015-09-21 NOTE — Progress Notes (Signed)
recd via fax,left for dr to sign

## 2015-09-21 NOTE — Progress Notes (Signed)
-  faxed to  [phone]

## 2015-09-21 NOTE — Progress Notes (Signed)
recd via fax,left for dr to sign- mailed copy to patient and sent to medical records

## 2015-09-24 ENCOUNTER — Encounter: Payer: Self-pay | Admitting: *Deleted

## 2015-09-26 ENCOUNTER — Encounter (HOSPITAL_COMMUNITY): Payer: Self-pay

## 2015-09-28 ENCOUNTER — Encounter: Payer: Self-pay | Admitting: *Deleted

## 2015-09-28 ENCOUNTER — Other Ambulatory Visit: Payer: Self-pay

## 2015-09-30 NOTE — Progress Notes (Signed)
[facility]  [phone] [phone]     ID: Ashley Tyler DOB: November 24, 1965  MR#: [id number]  ZSM#:[id number]  Patient Care Team: No Pcp Per Patient as PCP - General (General Practice) Chauncey Cruel, MD as Consulting Physician (Oncology) [name], MD as Consulting Physician (General Surgery) [name], MD as Attending Physician (Radiation Oncology) PCP: No PCP Per Patient GYN: OTHER MD:  CHIEF COMPLAINT: Estrogen receptor positive breast cancer  CURRENT TREATMENT: Adjuvant chemotherapy   BREAST CANCER HISTORY: From the original intake note:  Ashley Tyler had screening mammography at Dr. Caralee Ates office 07/24/2015 suggesting a left breast mass and the patient was referred to the [facility] where on 07/30/2015 she underwent left diagnostic mammography with tomosynthesis and left breast ultrasonography. The breast density was category C. In the upper outer quadrant of the left breast there was a 2.3 cm spiculated mass. There were no associated calcifications. This mass was palpable at the 1:00 position 5 cm from the nipple. Ultrasound confirmed an irregular hypoechoic mass measuring 1.8 cm. Ultrasonography of the left axilla was negative.  On 08/03/2015 the patient underwent biopsy of the left breast mass in question. The pathology ([id number]) showed an invasive ductal carcinoma, grade 3, estrogen receptor 60% positive, progesterone receptor 20% positive, both with strong staining intensity, with an MIB-1 of 70%, and HER-2 nonamplified, with a signals ratio of 1.18 and the number Purcell 2.00.  The patient then met with surgery and after appropriate discussion she proceeded to left lumpectomy and sentinel lymph node sampling 08/28/2015. The final pathology ([id number]) showed invasive ductal carcinoma, grade 3, measuring 2.1 cm. Margins were close but negative. All 3 sentinel lymph nodes were clear. Repeat HER-2 was again negative,  with a signals ratio of 0.9, and number per cell 2.20.  INTERVAL HISTORY: Ashley Tyler returns today for further discussion of her treatment options. She is accompanied by her significant other Ashley Tyler. at the last visit here we had discussed chemotherapy in the setting of triple negative disease, but I called her back later the same day unexplained I had misread her past and indeed she has estrogen receptor positive disease. Accordingly we send an Oncotype and that showed a score of 22, predicting a risk of outside the breast recurrence of 14% within the next 10 years if her only systemic therapy is tamoxifen for 5 years. It predicts the benefit of chemotherapy in the range of 5%.  REVIEW OF SYSTEMS: She is recovering well from her surgery, with no significant pain, bleeding, or swelling. She is very stressed because of the current situation and also does at work they are requiring over time and she does not think she is going to be able to do that if she decides on chemotherapy. She describes herself is moderately fatigued. Aside from these issues a detailed review of systems today was noncontributory   PAST MEDICAL HISTORY: Past Medical History  Diagnosis Date  . Headache   . Cancer ([place])     left breast  . Endometriosis   . Migraines   . Chronic constipation     PAST SURGICAL HISTORY: Past Surgical History  Procedure Laterality Date  . Cesarean section    . Tonsillectomy    . Laparoscopic salpingo oopherectomy Right   . Breast lumpectomy with radioactive seed and sentinel lymph node biopsy Left 08/28/2015    Procedure: LEFT BREAST LUMPECTOMY WITH RADIOACTIVE SEED AND SENTINEL LYMPH NODE BIOPSY;  Surgeon: [name], MD;  Location: [facility];  Service: General;  Laterality: Left;    FAMILY HISTORY Family History  Problem Relation Age of Onset  . Breast cancer Maternal Aunt     dx in her 30s  The patient's parents are still living, in their early 62s as  of April 2016. The patient is an only child. On her father's side there are 4 cases of breast cancer, 2 aunts diagnosed in their 57s and 26s, and 2 cousins diagnosed in her 97s and one at age 60. The patient's father had prostate cancer diagnosed age 48.  GYNECOLOGIC HISTORY:  No LMP recorded. Menarche age 40, first live birth age 23. The patient is GX P2, although both children were born early. She is status post right salpingo-oophorectomy due to endometriosis. 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 At home is just she and her daughter Ashley Tyler, 33. Her other daughter, Ashley Tyler, is studying criminal justice at [employer]. Her SO is this is with. Ashley Tyler    ADVANCED DIRECTIVES: Not in place   HEALTH MAINTENANCE: Social History  Substance Use Topics  . Smoking status: Never Smoker   . Smokeless tobacco: Not on file  . Alcohol Use: Yes     Comment: social     Colonoscopy:  PAP:  Bone density:  Lipid panel:  Allergies  Allergen Reactions  . Tetracyclines & Related Itching  . Other Rash    Powder in gloves causes rash    Current Outpatient Prescriptions  Medication Sig Dispense Refill  . Ascorbic Acid (VITAMIN C PO) Take 1 tablet by mouth daily.    . Halobetasol Oint &Lactic Ac Cr (ULTRAVATE X, OINTMENT,) 0.05 & 10 % KIT Apply 1 application topically daily as needed (eczema).    [name] ibuprofen (ADVIL,MOTRIN) 200 MG tablet Take 400 mg by mouth daily as needed for headache.    . IODINE, KELP, PO Take by mouth 2 (two) times daily.    [name] UNABLE TO FIND Pt on multiple supplements - theraxym, turmero, burdock complex,  Herbal tonic ( calendula, poke root, echinacia, atstragulus ), withania complex, Vitanox, Herbavital, AC Glutahthione, Glutihione recycler  Above prescribed by [facility]  Dr Salomon Fick Ward     No current  facility-administered medications for this visit.    OBJECTIVE: Middle-aged African-American woman In no acute distress Filed Vitals:   10/01/15 0813  BP: 112/68  Pulse: 69  Temp: 98 F (36.7 C)  Resp: 18     Body mass index is 29.09 kg/(m^2).    ECOG FS:0 - Asymptomatic  Exam not repeated today  LAB RESULTS:  CMP     Component Value Date/Time   NA 139 09/13/2015 1550   NA 141 11/13/2013 2008   K 3.9 09/13/2015 1550   K 4.0 11/13/2013 2008   CL 103 11/13/2013 2008   CO2 26 09/13/2015 1550   CO2 25 11/13/2013 2008   GLUCOSE 85 09/13/2015 1550   GLUCOSE 91 11/13/2013 2008   BUN 16.1 09/13/2015 1550   BUN 13 11/13/2013 2008   CREATININE 1.0 09/13/2015 1550   CREATININE 1.05 11/13/2013 2008   CALCIUM 9.4 09/13/2015 1550   CALCIUM 9.0 11/13/2013 2008   PROT 7.4 09/13/2015 1550   ALBUMIN 3.7 09/13/2015 1550   AST 23 09/13/2015 1550   ALT 38 09/13/2015 1550   ALKPHOS 79 09/13/2015 1550   BILITOT 0.49 09/13/2015  [place] 11/13/2013 2008   GFRAA 71* 11/13/2013 2008    INo results found for: SPEP, UPEP  Lab Results  Component Value Date   WBC 7.5 09/13/2015   NEUTROABS 4.7 09/13/2015   HGB 13.0 09/13/2015   HCT 40.2 09/13/2015   MCV 85.5 09/13/2015   PLT 312 09/13/2015      Chemistry      Component Value Date/Time   NA 139 09/13/2015 1550   NA 141 11/13/2013 2008   K 3.9 09/13/2015 1550   K 4.0 11/13/2013 2008   CL 103 11/13/2013 2008   CO2 26 09/13/2015 1550   CO2 25 11/13/2013 2008   BUN 16.1 09/13/2015 1550   BUN 13 11/13/2013 2008   CREATININE 1.0 09/13/2015 1550   CREATININE 1.05 11/13/2013 2008      Component Value Date/Time   CALCIUM 9.4 09/13/2015 1550   CALCIUM 9.0 11/13/2013 2008   ALKPHOS 79 09/13/2015 1550   AST 23 09/13/2015 1550   ALT 38 09/13/2015 1550   BILITOT 0.49 09/13/2015 1550       No results found for: LABCA2  No components found for: LABCA125  No results for input(s): INR in the last 168  hours.  Urinalysis    Component Value Date/Time   COLORURINE YELLOW 11/13/2013 2030   APPEARANCEUR CLOUDY* 11/13/2013 2030   LABSPEC 1.013 11/13/2013 2030   PHURINE 5.5 11/13/2013 2030   GLUCOSEU NEGATIVE 11/13/2013 2030   [place] NEGATIVE 11/13/2013 2030   [place] NEGATIVE 11/13/2013 2030   KETONESUR 15* 11/13/2013 2030   PROTEINUR NEGATIVE 11/13/2013 2030   UROBILINOGEN 0.2 11/13/2013 2030   NITRITE NEGATIVE 11/13/2013 2030   LEUKOCYTESUR TRACE* 11/13/2013 2030      ELIGIBLE FOR AVAILABLE RESEARCH PROTOCOL: PALLAS  STUDIES: No results found.  ASSESSMENT: 50 y.o. [place] woman status post left breast upper outer quadrant biopsy 08/03/2015 for a clinical T2 N0, stage IIa invasive ductal carcinoma, grade 3, estrogen and progesterone receptor positive, HER-2 not amplified, with an MIB-1 of 70%  (1) status post left lumpectomy and sentinel lymph node sampling 08/28/2015 for a pT2 pN0, stage IIA invasive ductal carcinoma, grade 3, with close but negative margins. Repeat HER-2 was again negative  (a) oncotype DX Score of 22 predicts a risk of recurrence outside the breast within 10 years of 14% if the patient's only systemic therapy is tamoxifen for 5 years. The addition of CMF reduced the risk an additional 4 %  (2) adjuvant chemotherapy to consist of cyclophosphamide and docetaxel 4 given 21 days apart, with on Pro support  (3) adjuvant radiation to follow chemotherapy  (4) adjuvant anti-estrogens to follow radiation, with consideration of the PALLAS trial   (5) genetics testing scheduled for 10/01/2015    PLAN:  I spent approximately 35 minutes today with Gennifer and her significant other going over her situation. She understands that her Oncotype falls in the intermediate range. Accordingly we discussed the benefits obtained in that database by patients who received chemotherapy. That shows an approximately 4% benefit in addition to the benefit from  tamoxifen.  However the chemotherapy given in that database was CMF or MF, which is inferior your current standard. I think a 5% benefit from chemotherapy is more realistic and this is in line with the general rule of thumb that chemotherapy reduces risk by about one third.  We then discussed the specifics of chemotherapy in her case which would be Cytoxan and Taxotere 4 with on Pro support. We discussed  the possible toxicities, side effects and complications.  After much discussion Ashley Tyler decided that the 5% benefit was meaningful to her and she wishes to proceed with chemotherapy. She would like to continue to work through her treatments but she does not feel she will be able to do over time and I think this is reasonable. I will write her a letter in that regard. We also discussed the port and she will benefit from having that place. I have sent a note to Dr. [name] regarding that.  She will receive genetics testing today. Hopefully she will have the "chemotherapy training" today as well. She will return May 8 for instructions on how to take her supportive metastases and then receive her first treatment May 11. She will return May 18, to discuss how she did with her first cycle of treatment and to improve any issues that need improving before she receives the second cycle.  Once she completes her 4 cycles of chemotherapy she will proceed to radiation.  Ashley Tyler has a good understanding of this plan. She agrees with it. She knows the goal of treatment in her case is cure. She will call with any problems that may develop before her next visit here. Chauncey Cruel, MD   10/01/2015 8:59 AM Medical Oncology and Hematology [facility] [address] [place], [place] [address] Tel. [phone]    Fax. [phone]

## 2015-10-01 ENCOUNTER — Other Ambulatory Visit: Payer: Self-pay | Admitting: Oncology

## 2015-10-01 ENCOUNTER — Other Ambulatory Visit: Payer: 59

## 2015-10-01 ENCOUNTER — Ambulatory Visit: Payer: 59 | Admitting: Genetic Counselor

## 2015-10-01 ENCOUNTER — Ambulatory Visit (HOSPITAL_BASED_OUTPATIENT_CLINIC_OR_DEPARTMENT_OTHER): Payer: 59 | Admitting: Oncology

## 2015-10-01 ENCOUNTER — Encounter: Payer: Self-pay | Admitting: *Deleted

## 2015-10-01 ENCOUNTER — Telehealth: Payer: Self-pay | Admitting: Oncology

## 2015-10-01 ENCOUNTER — Encounter: Payer: Self-pay | Admitting: Genetic Counselor

## 2015-10-01 VITALS — BP 112/68 | HR 69 | Temp 98.0°F | Resp 18 | Ht 65.5 in | Wt 177.6 lb

## 2015-10-01 DIAGNOSIS — Z170 Estrogen receptor positive status [ER+]: Secondary | ICD-10-CM | POA: Diagnosis not present

## 2015-10-01 DIAGNOSIS — Z803 Family history of malignant neoplasm of breast: Secondary | ICD-10-CM

## 2015-10-01 DIAGNOSIS — C50412 Malignant neoplasm of upper-outer quadrant of left female breast: Secondary | ICD-10-CM

## 2015-10-01 DIAGNOSIS — Z8042 Family history of malignant neoplasm of prostate: Secondary | ICD-10-CM | POA: Insufficient documentation

## 2015-10-01 NOTE — Progress Notes (Signed)
[facility] Psychosocial Distress Screening Clinical Social Work  Clinical Social Work was referred by distress screening protocol.  The patient scored a 5 on the Psychosocial Distress Thermometer which indicates moderate distress. Clinical Social Worker phoned pt to assess for distress and other psychosocial needs. Pt reports to be adjusted adequately. Her biggest concern is finances, but she makes too much to qualify for any assistance programs. Pt could possibly get help with transportation assistance through the [employer], however. Pt reports to have good support and feels her anxiety is manageable. Pt made aware of other resources for assistance and agrees to reach out as needed.   ONCBCN DISTRESS SCREENING 09/19/2015  Screening Type Initial Screening  Distress experienced in past week (1-10) 5  Emotional problem type Nervousness/Anxiety    Clinical Social Worker follow up needed: No.  If yes, follow up plan:  Loren Racer, [place]  [facility] Phone: [phone] Fax: [phone]

## 2015-10-01 NOTE — Telephone Encounter (Signed)
appt made and avs printed °

## 2015-10-01 NOTE — Progress Notes (Signed)
REFERRING PROVIDER: Lurline Del, MD  PRIMARY PROVIDER:  No PCP Per Patient  PRIMARY REASON FOR VISIT:  1. Breast cancer of upper-outer quadrant of left female breast ([place])   2. Family history of breast cancer   3. Family history of prostate cancer      HISTORY OF PRESENT ILLNESS:   Ashley Tyler, a 50 y.o. female, was seen for a [facility] cancer genetics consultation at the request of Dr. [name] due to a personal and family history of cancer.  Ashley Tyler presents to clinic today to discuss the possibility of a hereditary predisposition to cancer, genetic testing, and to further clarify her future cancer risks, as well as potential cancer risks for family members.   In 2017, at the age of 19, Ashley Tyler was diagnosed with invasive ductal carcinoma of the breast. The tumor was ER+/PR+/Her2-. This was treated with lumpectomy. Her chemotherapy is scheduled to start in May, and she will have radiation and tamoxifen.    CANCER HISTORY:   No history exists.     HORMONAL RISK FACTORS:  Menarche was at age 25.  First live birth at age 1.  OCP use for approximately 5-10 years.  Ovaries intact: yes.  Hysterectomy: no.  Menopausal status: perimenopausal.  HRT use: 0 years. Colonoscopy: yes; 2-3 polyps. Mammogram within the last year: yes. Number of breast biopsies: 1. Up to date with pelvic exams:  yes. Any excessive radiation exposure in the past:  no  Past Medical History  Diagnosis Date  . Headache   . Cancer ([place])     left breast  . Endometriosis   . Migraines   . Chronic constipation   . Family history of breast cancer   . Family history of prostate cancer     Past Surgical History  Procedure Laterality Date  . Cesarean section    . Tonsillectomy    . Laparoscopic salpingo oopherectomy Right   . Breast lumpectomy with radioactive seed and sentinel lymph node biopsy Left 08/28/2015    Procedure: LEFT BREAST LUMPECTOMY WITH RADIOACTIVE SEED  AND SENTINEL LYMPH NODE BIOPSY;  Surgeon: [name], MD;  Location: [facility];  Service: General;  Laterality: Left;    Social History   Social History  . Marital Status: Divorced    Spouse Name: N/A  . Number of Children: 2  . Years of Education: N/A   Social History Main Topics  . Smoking status: Never Smoker   . Smokeless tobacco: None  . Alcohol Use: Yes     Comment: social  . Drug Use: No  . Sexual Activity: Yes    Birth Control/ Protection: Surgical   Other Topics Concern  . None   Social History Narrative     FAMILY HISTORY:  We obtained a detailed, 4-generation family history.  Significant diagnoses are listed below: Family History  Problem Relation Age of Onset  . Prostate cancer Father 36    Gleason = 7  . Prostate cancer Maternal Uncle     dx in his 62s  . Breast cancer Paternal Aunt     dx in her 72s  . Prostate cancer Paternal Uncle   . Alzheimer's disease Maternal Grandmother   . Lung cancer Maternal Grandfather   . Congestive Heart Failure Paternal Grandmother   . Prostate cancer Maternal Uncle     dx in his 47s  . Breast cancer Paternal Aunt     dx in her 58s; maternal half paternal aunt  . Prostate  cancer Paternal Uncle     dx in his 7s  . Breast cancer Cousin   . Breast cancer Cousin     father's maternal cousin's daughter; dx in her 56s-30s    The patient has two duaghters who are healthy and cancer free.  She is an only child.  Her parents are alive, her mother is 66 and her father is 25.  He was diagnosed with prostate cancer at 70 with Gleason score of 7.  Her mother had four brothers and one sister.  Two brothers had prostate cancer.  There is no other cancer history on this side of the family.  Her father had nine siblings.  One maternal half sister had breast cancer in her 46s and her daughter had breast cancer in her 57s.  A full sister also had breast cancer in her 65s.  Two brothers were diagnosed with prostate  cancer.  The patient's father had a maternal first cousin who's daughter had breast cancer in her 33s-30s.  Patient's maternal ancestors are of [place] American and Native American descent, and paternal ancestors are of [place] American and Native American descent. There is no reported Ashkenazi Jewish ancestry. There is no known consanguinity.  GENETIC COUNSELING ASSESSMENT: Ashley Tyler is a 50 y.o. female with a personal and family history of cancer which is somewhat suggestive of a hereditary cancer syndrome and predisposition to cancer. We, therefore, discussed and recommended the following at today's visit.   DISCUSSION: We discussed that about 5-10% of breast cancer with most cases due to BRCA mutations.  Other genes that can be seen frequently include ATM, PALB2 and CHEK2.  We reviewed the characteristics, features and inheritance patterns of hereditary cancer syndromes. We also discussed genetic testing, including the appropriate family members to test, the process of testing, insurance coverage and turn-around-time for results. We discussed the implications of a negative, positive and/or variant of uncertain significant result. We recommended Ashley Tyler pursue genetic testing for the Breast/Ovarian cancer gene panel. The Breast/Ovarian gene panel offered by [facility] includes sequencing and rearrangement analysis for the following 20 genes:  ATM, BARD1, BRCA1, BRCA2, BRIP1, CDH1, CHEK2, EPCAM, FANCC, MLH1, MSH2, MSH6, NBN, PALB2, PMS2, PTEN, RAD51C, RAD51D, TP53, and XRCC2.     Based on Ashley Tyler's personal and family history of cancer, she meets medical criteria for genetic testing. Despite that she meets criteria, she may still have an out of pocket cost. We discussed that if her out of pocket cost for testing is over $100, the laboratory will call and confirm whether she wants to proceed with testing.  If the out of pocket cost of testing is less than $100 she will be  billed by the genetic testing laboratory.   PLAN: After considering the risks, benefits, and limitations, Ashley Tyler  provided informed consent to pursue genetic testing and the blood sample was sent to [facility] for analysis of the Breast/Ovarian cancer panel. Results should be available within approximately 2-3 weeks' time, at which point they will be disclosed by telephone to Ashley Tyler, as will any additional recommendations warranted by these results. Ashley Tyler will receive a summary of her genetic counseling visit and a copy of her results once available. This information will also be available in Epic. We encouraged Ashley Tyler to remain in contact with cancer genetics annually so that we can continuously update the family history and inform her of any changes in cancer genetics and testing that may be of benefit for her family.  Ashley Tyler questions were answered to her satisfaction today. Our contact information was provided should additional questions or concerns arise.  Lastly, we encouraged Ashley Tyler to remain in contact with cancer genetics annually so that we can continuously update the family history and inform her of any changes in cancer genetics and testing that may be of benefit for this family.   Ms.  Tyler questions were answered to her satisfaction today. Our contact information was provided should additional questions or concerns arise. Thank you for the referral and allowing [place] to share in the care of your patient.   Darrell Hauk P. Florene Glen, [place], [facility] Certified Genetic Counselor [email or web address] phone: [phone]  The patient was seen for a total of 45 minutes in face-to-face genetic counseling.  This patient was discussed with Drs. Magrinat, Lindi Adie and/or Burr Medico who agrees with the above.    _______________________________________________________________________ For Office Staff:  Number of people  involved in session: 2 Was an Intern/ student involved with case: no

## 2015-10-02 ENCOUNTER — Other Ambulatory Visit: Payer: 59

## 2015-10-02 ENCOUNTER — Encounter: Payer: Self-pay | Admitting: *Deleted

## 2015-10-04 ENCOUNTER — Telehealth: Payer: Self-pay | Admitting: *Deleted

## 2015-10-04 NOTE — Telephone Encounter (Signed)
  Oncology Nurse Navigator Documentation    Navigator Encounter Type: Telephone (10/04/15 1400) Telephone: Lahoma Crocker Call;Appt Confirmation/Clarification (10/04/15 1400)                 Interventions: Coordination of Care (10/04/15 1400)  Msg sent to Dr. [name] concerning port placement prior to chemo on 5/11                    Time Spent with Patient: 15 (10/04/15 1400)

## 2015-10-08 ENCOUNTER — Other Ambulatory Visit: Payer: Self-pay | Admitting: Nurse Practitioner

## 2015-10-08 ENCOUNTER — Encounter: Payer: Self-pay | Admitting: Nurse Practitioner

## 2015-10-08 ENCOUNTER — Telehealth: Payer: Self-pay | Admitting: Oncology

## 2015-10-08 ENCOUNTER — Telehealth: Payer: Self-pay | Admitting: *Deleted

## 2015-10-08 ENCOUNTER — Ambulatory Visit (HOSPITAL_BASED_OUTPATIENT_CLINIC_OR_DEPARTMENT_OTHER): Payer: 59 | Admitting: Nurse Practitioner

## 2015-10-08 VITALS — BP 118/74 | HR 67 | Temp 98.1°F | Resp 16 | Wt 177.8 lb

## 2015-10-08 DIAGNOSIS — Z170 Estrogen receptor positive status [ER+]: Secondary | ICD-10-CM

## 2015-10-08 DIAGNOSIS — C50412 Malignant neoplasm of upper-outer quadrant of left female breast: Secondary | ICD-10-CM

## 2015-10-08 MED ORDER — DEXAMETHASONE 4 MG PO TABS: 8.0000 mg | ORAL_TABLET | Freq: Two times a day (BID) | ORAL | Status: DC

## 2015-10-08 MED ORDER — LORAZEPAM 0.5 MG PO TABS: 0.5000 mg | ORAL_TABLET | Freq: Every evening | ORAL | Status: DC | PRN

## 2015-10-08 MED ORDER — PROCHLORPERAZINE MALEATE 10 MG PO TABS: 10.0000 mg | ORAL_TABLET | Freq: Four times a day (QID) | ORAL | Status: DC | PRN

## 2015-10-08 MED ORDER — ONDANSETRON HCL 8 MG PO TABS: 8.0000 mg | ORAL_TABLET | Freq: Two times a day (BID) | ORAL | Status: DC | PRN

## 2015-10-08 MED ORDER — LIDOCAINE-PRILOCAINE 2.5-2.5 % EX CREA: TOPICAL_CREAM | CUTANEOUS | Status: DC

## 2015-10-08 NOTE — Progress Notes (Signed)
[facility]  [phone]) [phone] [phone]   ID: [name] DOB: 1965-07-16  MR#: [id number]  EUM#:[id number]  Patient Care Team: No Pcp Per Patient as PCP - General (General Practice) Chauncey Cruel, MD as Consulting Physician (Oncology) [name], MD as Consulting Physician (General Surgery) [name], MD as Attending Physician (Radiation Oncology) PCP: No PCP Per Patient GYN: OTHER MD:  CHIEF COMPLAINT: Estrogen receptor positive breast cancer  CURRENT TREATMENT: Adjuvant chemotherapy  BREAST CANCER HISTORY: From the original intake note:  Ramona had screening mammography at Dr. Caralee Ates office 07/24/2015 suggesting a left breast mass and the patient was referred to the [facility] where on 07/30/2015 she underwent left diagnostic mammography with tomosynthesis and left breast ultrasonography. The breast density was category C. In the upper outer quadrant of the left breast there was a 2.3 cm spiculated mass. There were no associated calcifications. This mass was palpable at the 1:00 position 5 cm from the nipple. Ultrasound confirmed an irregular hypoechoic mass measuring 1.8 cm. Ultrasonography of the left axilla was negative.  On 08/03/2015 the patient underwent biopsy of the left breast mass in question. The pathology ([id number]) showed an invasive ductal carcinoma, grade 3, estrogen receptor 60% positive, progesterone receptor 20% positive, both with strong staining intensity, with an MIB-1 of 70%, and HER-2 nonamplified, with a signals ratio of 1.18 and the number Purcell 2.00.  The patient then met with surgery and after appropriate discussion she proceeded to left lumpectomy and sentinel lymph node sampling 08/28/2015. The final pathology ([id number]) showed invasive ductal carcinoma, grade 3, measuring 2.1 cm. Margins were close but negative. All 3 sentinel lymph nodes were clear. Repeat HER-2 was again negative, with a  signals ratio of 0.9, and number per cell 2.20.  INTERVAL HISTORY: Wafaa returns today for follow up of her breast cancer, accompanied by her daughter. She is here to discuss the antiemetic schedule for her upcoming chemotherapy regimen, cyclophosphamide and docetaxel x 4.   REVIEW OF SYSTEMS: She describes herself is moderately fatigued. She is anxious about starting chemotherapy. Aside from these issues a detailed review of systems today was noncontributory   PAST MEDICAL HISTORY: Past Medical History  Diagnosis Date  . Headache   . Cancer ([place])     left breast  . Endometriosis   . Migraines   . Chronic constipation   . Family history of breast cancer   . Family history of prostate cancer     PAST SURGICAL HISTORY: Past Surgical History  Procedure Laterality Date  . Cesarean section    . Tonsillectomy    . Laparoscopic salpingo oopherectomy Right   . Breast lumpectomy with radioactive seed and sentinel lymph node biopsy Left 08/28/2015    Procedure: LEFT BREAST LUMPECTOMY WITH RADIOACTIVE SEED AND SENTINEL LYMPH NODE BIOPSY;  Surgeon: [name], MD;  Location: [facility];  Service: General;  Laterality: Left;    FAMILY HISTORY Family History  Problem Relation Age of Onset  . Prostate cancer Father 71    Gleason = 7  . Prostate cancer Maternal Uncle     dx in his 75s  . Breast cancer Paternal Aunt     dx in her 92s  . Prostate cancer Paternal Uncle   . Alzheimer's disease Maternal Grandmother   . Lung cancer Maternal Grandfather   . Congestive Heart Failure Paternal Grandmother   . Prostate cancer Maternal Uncle     dx in his 65s  . Breast cancer  Paternal Aunt     dx in her 17s; maternal half paternal aunt  . Prostate cancer Paternal Uncle     dx in his 54s  . Breast cancer Cousin   . Breast cancer Cousin     father's maternal cousin's daughter; dx in her 82s-30s  The patient's parents are still living, in their early 54s as of April 2016.  The patient is an only child. On her father's side there are 4 cases of breast cancer, 2 aunts diagnosed in their 6s and 61s, and 2 cousins diagnosed in her 24s and one at age 27. The patient's father had prostate cancer diagnosed age 71.  GYNECOLOGIC HISTORY:  No LMP recorded. Menarche age 52, first live birth age 32. The patient is GX P2, although both children were born early. She is status post right salpingo-oophorectomy due to endometriosis. 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 She is divorced. At home is just she and her daughter [name], 40. Her other daughter, [name], is studying criminal justice at [employer]. Her SO is this is with. [name]    ADVANCED DIRECTIVES: Not in place   HEALTH MAINTENANCE: Social History  Substance Use Topics  . Smoking status: Never Smoker   . Smokeless tobacco: Not on file  . Alcohol Use: Yes     Comment: social     Colonoscopy:  PAP:  Bone density:  Lipid panel:  Allergies  Allergen Reactions  . Tetracyclines & Related Itching  . Other Rash    Powder in gloves causes rash    Current Outpatient Prescriptions  Medication Sig Dispense Refill  . Ascorbic Acid (VITAMIN C PO) Take 1 tablet by mouth daily.    . IODINE, KELP, PO Take by mouth 2 (two) times daily.    [name] UNABLE TO FIND Pt on multiple supplements - theraxym, turmero, burdock complex,  Herbal tonic ( calendula, poke root, echinacia, atstragulus ), withania complex, Vitanox, Herbavital, AC Glutahthione, Glutihione recycler  Above prescribed by [facility]  Dr Salomon Fick Ward    . dexamethasone (DECADRON) 4 MG tablet Take 2 tablets (8 mg total) by mouth 2 (two) times daily. Start the day before Taxotere. Then again the day after chemo for 3 days. 30 tablet 1  . Halobetasol Oint &Lactic Ac Cr (ULTRAVATE X, OINTMENT,) 0.05 & 10 % KIT Apply 1  application topically daily as needed (eczema). Reported on 10/08/2015    . ibuprofen (ADVIL,MOTRIN) 200 MG tablet Take 400 mg by mouth daily as needed for headache. Reported on 10/08/2015    . lidocaine-prilocaine (EMLA) cream Apply to affected area once 30 g 3  . LORazepam (ATIVAN) 0.5 MG tablet Take 1 tablet (0.5 mg total) by mouth at bedtime as needed (Nausea or vomiting). 30 tablet 0  . ondansetron (ZOFRAN) 8 MG tablet Take 1 tablet (8 mg total) by mouth 2 (two) times daily as needed for refractory nausea / vomiting. Start on day 3 after chemo. 30 tablet 1  . prochlorperazine (COMPAZINE) 10 MG tablet Take 1 tablet (10 mg total) by mouth every 6 (six) hours as needed (Nausea or vomiting). 30 tablet 1   No current facility-administered medications for this visit.    OBJECTIVE: Middle-aged African-American woman In no acute distress Filed Vitals:   10/08/15 1435  BP: 118/74  Pulse: 67  Temp: 98.1 F (36.7 C)  Resp: 16  Body mass index is 29.13 kg/(m^2).    ECOG FS:0 - Asymptomatic  Exam not repeated today  LAB RESULTS:  CMP     Component Value Date/Time   NA 139 09/13/2015 1550   NA 141 11/13/2013 2008   K 3.9 09/13/2015 1550   K 4.0 11/13/2013 2008   CL 103 11/13/2013 2008   CO2 26 09/13/2015 1550   CO2 25 11/13/2013 2008   GLUCOSE 85 09/13/2015 1550   GLUCOSE 91 11/13/2013 2008   BUN 16.1 09/13/2015 1550   BUN 13 11/13/2013 2008   CREATININE 1.0 09/13/2015 1550   CREATININE 1.05 11/13/2013 2008   CALCIUM 9.4 09/13/2015 1550   CALCIUM 9.0 11/13/2013 2008   PROT 7.4 09/13/2015 1550   ALBUMIN 3.7 09/13/2015 1550   AST 23 09/13/2015 1550   ALT 38 09/13/2015 1550   ALKPHOS 79 09/13/2015 1550   BILITOT 0.49 09/13/2015 1550   GFRNONAA 61* 11/13/2013 2008   GFRAA 71* 11/13/2013 2008    INo results found for: SPEP, UPEP  Lab Results  Component Value Date   WBC 7.5 09/13/2015   NEUTROABS 4.7 09/13/2015   HGB 13.0 09/13/2015   HCT 40.2 09/13/2015   MCV 85.5  09/13/2015   PLT 312 09/13/2015      Chemistry      Component Value Date/Time   NA 139 09/13/2015 1550   NA 141 11/13/2013 2008   K 3.9 09/13/2015 1550   K 4.0 11/13/2013 2008   CL 103 11/13/2013 2008   CO2 26 09/13/2015 1550   CO2 25 11/13/2013 2008   BUN 16.1 09/13/2015 1550   BUN 13 11/13/2013 2008   CREATININE 1.0 09/13/2015 1550   CREATININE 1.05 11/13/2013 2008      Component Value Date/Time   CALCIUM 9.4 09/13/2015 1550   CALCIUM 9.0 11/13/2013 2008   ALKPHOS 79 09/13/2015 1550   AST 23 09/13/2015 1550   ALT 38 09/13/2015 1550   BILITOT 0.49 09/13/2015 1550       No results found for: LABCA2  No components found for: LABCA125  No results for input(s): INR in the last 168 hours.  Urinalysis    Component Value Date/Time   COLORURINE YELLOW 11/13/2013 2030   APPEARANCEUR CLOUDY* 11/13/2013 2030   LABSPEC 1.013 11/13/2013 2030   PHURINE 5.5 11/13/2013 2030   GLUCOSEU NEGATIVE 11/13/2013 2030   [place] NEGATIVE 11/13/2013 2030   [place] NEGATIVE 11/13/2013 2030   KETONESUR 15* 11/13/2013 2030   PROTEINUR NEGATIVE 11/13/2013 2030   UROBILINOGEN 0.2 11/13/2013 2030   NITRITE NEGATIVE 11/13/2013 2030   LEUKOCYTESUR TRACE* 11/13/2013 2030      ELIGIBLE FOR AVAILABLE RESEARCH PROTOCOL: PALLAS  STUDIES: No results found.  ASSESSMENT: 50 y.o. [place] woman status post left breast upper outer quadrant biopsy 08/03/2015 for a clinical T2 N0, stage IIa invasive ductal carcinoma, grade 3, estrogen and progesterone receptor positive, HER-2 not amplified, with an MIB-1 of 70%  (1) status post left lumpectomy and sentinel lymph node sampling 08/28/2015 for a pT2 pN0, stage IIA invasive ductal carcinoma, grade 3, with close but negative margins. Repeat HER-2 was again negative  (a) oncotype DX Score of 22 predicts a risk of recurrence outside the breast within 10 years of 14% if the patient's only systemic therapy is tamoxifen for 5 years. The addition of CMF  reduced the risk an additional 4 %  (2) adjuvant chemotherapy to consist of cyclophosphamide and docetaxel 4 given 21 days apart, with on Pro support  (3)  adjuvant radiation to follow chemotherapy  (4) adjuvant anti-estrogens to follow radiation, with consideration of the PALLAS trial   (5) genetics testing scheduled for 10/01/2015    PLAN: Deamber and I spent over 40 minutes discussing her upcoming chemotherapy plans. She was made aware of potential side effects and toxicities cyclophosphamide and docetaxel. She was made aware of her neulasta injections, including the fact that these will be administered with an onbody injector that she will need to wear until the device is no longer active. She attended chemotherapy school last week, so a lot of this information was review for her.   Finally, we reviewed her antiemetic schedule, and she feels comfortable with every medication listed as well as the indication and potential side effects. These prescriptions were sent to her pharmacy today.   Girl will return on Thursday for her first cycle of treatment. She understands and agrees with this plan. She knows the goal of treatment in her case is cure. She has been encouraged to call with any issues that might arise before her next visit here  Laurie Panda, NP   10/08/2015 4:39 PM

## 2015-10-08 NOTE — Telephone Encounter (Signed)
Per staff message and POF I have adjusted  appts. Advised scheduler of appts. JMW  

## 2015-10-08 NOTE — Telephone Encounter (Signed)
Gave and printed appt sched and avs for pt for May and JUNE °

## 2015-10-10 ENCOUNTER — Other Ambulatory Visit: Payer: Self-pay | Admitting: *Deleted

## 2015-10-10 ENCOUNTER — Other Ambulatory Visit: Payer: Self-pay | Admitting: Oncology

## 2015-10-10 DIAGNOSIS — C50412 Malignant neoplasm of upper-outer quadrant of left female breast: Secondary | ICD-10-CM

## 2015-10-11 ENCOUNTER — Encounter: Payer: Self-pay | Admitting: *Deleted

## 2015-10-11 ENCOUNTER — Ambulatory Visit (HOSPITAL_BASED_OUTPATIENT_CLINIC_OR_DEPARTMENT_OTHER): Payer: 59

## 2015-10-11 ENCOUNTER — Other Ambulatory Visit (HOSPITAL_BASED_OUTPATIENT_CLINIC_OR_DEPARTMENT_OTHER): Payer: 59

## 2015-10-11 VITALS — BP 117/78 | HR 55 | Temp 97.5°F | Resp 18

## 2015-10-11 DIAGNOSIS — Z5111 Encounter for antineoplastic chemotherapy: Secondary | ICD-10-CM

## 2015-10-11 DIAGNOSIS — C50412 Malignant neoplasm of upper-outer quadrant of left female breast: Secondary | ICD-10-CM

## 2015-10-11 DIAGNOSIS — Z803 Family history of malignant neoplasm of breast: Secondary | ICD-10-CM

## 2015-10-11 DIAGNOSIS — Z8042 Family history of malignant neoplasm of prostate: Secondary | ICD-10-CM

## 2015-10-11 DIAGNOSIS — R922 Inconclusive mammogram: Secondary | ICD-10-CM

## 2015-10-11 LAB — CBC WITH DIFFERENTIAL/PLATELET
BASO%: 0 % (ref 0.0–2.0)
BASOS ABS: 0 10*3/uL (ref 0.0–0.1)
EOS ABS: 0 10*3/uL (ref 0.0–0.5)
EOS%: 0 % (ref 0.0–7.0)
HCT: 36.4 % (ref 34.8–46.6)
HEMOGLOBIN: 12 g/dL (ref 11.6–15.9)
LYMPH%: 20.1 % (ref 14.0–49.7)
MCH: 27.6 pg (ref 25.1–34.0)
MCHC: 33 g/dL (ref 31.5–36.0)
MCV: 83.7 fL (ref 79.5–101.0)
MONO#: 0.4 10*3/uL (ref 0.1–0.9)
MONO%: 4.4 % (ref 0.0–14.0)
NEUT#: 7.5 10*3/uL — ABNORMAL HIGH (ref 1.5–6.5)
NEUT%: 75.5 % (ref 38.4–76.8)
Platelets: 278 10*3/uL (ref 145–400)
RBC: 4.35 10*6/uL (ref 3.70–5.45)
RDW: 14.1 % (ref 11.2–14.5)
WBC: 9.9 10*3/uL (ref 3.9–10.3)
lymph#: 2 10*3/uL (ref 0.9–3.3)

## 2015-10-11 LAB — COMPREHENSIVE METABOLIC PANEL
ALK PHOS: 65 U/L (ref 40–150)
ALT: 73 U/L — ABNORMAL HIGH (ref 0–55)
AST: 71 U/L — AB (ref 5–34)
Albumin: 3.7 g/dL (ref 3.5–5.0)
Anion Gap: 7 mEq/L (ref 3–11)
BUN: 16.3 mg/dL (ref 7.0–26.0)
CHLORIDE: 108 meq/L (ref 98–109)
CO2: 25 meq/L (ref 22–29)
Calcium: 9.3 mg/dL (ref 8.4–10.4)
Creatinine: 1 mg/dL (ref 0.6–1.1)
EGFR: 75 mL/min/{1.73_m2} — AB (ref >90)
GLUCOSE: 107 mg/dL (ref 70–140)
POTASSIUM: 4.3 meq/L (ref 3.5–5.1)
SODIUM: 140 meq/L (ref 136–145)
Total Bilirubin: 0.44 mg/dL (ref 0.20–1.20)
Total Protein: 6.9 g/dL (ref 6.4–8.3)

## 2015-10-11 MED ORDER — SODIUM CHLORIDE 0.9% FLUSH
10.0000 mL | INTRAVENOUS | Status: DC | PRN
  Administered 2015-10-11: 10 mL
  Filled 2015-10-11: qty 10

## 2015-10-11 MED ORDER — SODIUM CHLORIDE 0.9 % IV SOLN
Freq: Once | INTRAVENOUS | Status: AC
  Administered 2015-10-11: 10:00:00 via INTRAVENOUS

## 2015-10-11 MED ORDER — SODIUM CHLORIDE 0.9 % IV SOLN
10.0000 mg | Freq: Once | INTRAVENOUS | Status: AC
  Administered 2015-10-11: 10 mg via INTRAVENOUS
  Filled 2015-10-11: qty 1

## 2015-10-11 MED ORDER — SODIUM CHLORIDE 0.9 % IV SOLN
75.0000 mg/m2 | Freq: Once | INTRAVENOUS | Status: AC
  Administered 2015-10-11: 140 mg via INTRAVENOUS
  Filled 2015-10-11: qty 14

## 2015-10-11 MED ORDER — SODIUM CHLORIDE 0.9 % IV SOLN
600.0000 mg/m2 | Freq: Once | INTRAVENOUS | Status: AC
  Administered 2015-10-11: 1160 mg via INTRAVENOUS
  Filled 2015-10-11: qty 58

## 2015-10-11 MED ORDER — PEGFILGRASTIM 6 MG/0.6ML ~~LOC~~ PSKT
6.0000 mg | PREFILLED_SYRINGE | Freq: Once | SUBCUTANEOUS | Status: AC
  Administered 2015-10-11: 6 mg via SUBCUTANEOUS
  Filled 2015-10-11: qty 0.6

## 2015-10-11 MED ORDER — PALONOSETRON HCL INJECTION 0.25 MG/5ML
0.2500 mg | Freq: Once | INTRAVENOUS | Status: AC
  Administered 2015-10-11: 0.25 mg via INTRAVENOUS

## 2015-10-11 MED ORDER — HEPARIN SOD (PORK) LOCK FLUSH 100 UNIT/ML IV SOLN
500.0000 [IU] | Freq: Once | INTRAVENOUS | Status: AC | PRN
  Administered 2015-10-11: 500 [IU]
  Filled 2015-10-11: qty 5

## 2015-10-11 MED ORDER — PALONOSETRON HCL INJECTION 0.25 MG/5ML
INTRAVENOUS | Status: AC
  Filled 2015-10-11: qty 5

## 2015-10-11 NOTE — Patient Instructions (Signed)
[facility] Discharge Instructions for Patients Receiving Chemotherapy  Today you received the following chemotherapy agents Taxotere/Cytoxan To help prevent nausea and vomiting after your treatment, we encourage you to take your nausea medication as prescribed.   If you develop nausea and vomiting that is not controlled by your nausea medication, call the clinic.   BELOW ARE SYMPTOMS THAT SHOULD BE REPORTED IMMEDIATELY:  *FEVER GREATER THAN 100.5 F  *CHILLS WITH OR WITHOUT FEVER  NAUSEA AND VOMITING THAT IS NOT CONTROLLED WITH YOUR NAUSEA MEDICATION  *UNUSUAL SHORTNESS OF BREATH  *UNUSUAL BRUISING OR BLEEDING  TENDERNESS IN MOUTH AND THROAT WITH OR WITHOUT PRESENCE OF ULCERS  *URINARY PROBLEMS  *BOWEL PROBLEMS  UNUSUAL RASH Items with * indicate a potential emergency and should be followed up as soon as possible.  Feel free to call the clinic you have any questions or concerns. The clinic phone number is [phone].  Please show the CHEMO ALERT CARD at check-in to the Emergency Department and triage nurse.   

## 2015-10-16 ENCOUNTER — Telehealth: Payer: Self-pay | Admitting: *Deleted

## 2015-10-16 NOTE — Telephone Encounter (Signed)
Voicemail: "I feel feverish, temp = 99.5.  I'm peeing a lot more than I'm drinking.  Feel terrible.  Received first Taxotere/Cytoxan five days ago.  Call [phone]."  With return call "this started last night.  Urine is clear like water, no odor, no pain or burning but my whole body hurts.  I can hardly hold it in.  Drank 48 oz water yesterday.  Not eating as much because nothing taste good.  No new medications or vitamins."  Offered to come in today for urine sample.  "I have an appointment Thursday.  Let's just play it by ear."  Will notify providers.  Advised she continue drinking water but to stop two to three hours before bedtime

## 2015-10-17 ENCOUNTER — Telehealth: Payer: Self-pay

## 2015-10-17 ENCOUNTER — Ambulatory Visit (HOSPITAL_COMMUNITY)
Admission: RE | Admit: 2015-10-17 | Discharge: 2015-10-17 | Disposition: A | Discharge status: Home / Self Care | Payer: 59 | Source: Ambulatory Visit | Attending: Oncology | Admitting: Oncology

## 2015-10-17 ENCOUNTER — Other Ambulatory Visit: Payer: Self-pay

## 2015-10-17 ENCOUNTER — Other Ambulatory Visit: Payer: Self-pay | Admitting: Nurse Practitioner

## 2015-10-17 ENCOUNTER — Other Ambulatory Visit (HOSPITAL_BASED_OUTPATIENT_CLINIC_OR_DEPARTMENT_OTHER): Payer: 59

## 2015-10-17 ENCOUNTER — Ambulatory Visit (HOSPITAL_BASED_OUTPATIENT_CLINIC_OR_DEPARTMENT_OTHER): Payer: 59 | Admitting: Nurse Practitioner

## 2015-10-17 VITALS — BP 113/66 | HR 116 | Temp 98.4°F | Resp 17 | Ht 65.5 in | Wt 174.1 lb

## 2015-10-17 DIAGNOSIS — C50412 Malignant neoplasm of upper-outer quadrant of left female breast: Secondary | ICD-10-CM

## 2015-10-17 DIAGNOSIS — E860 Dehydration: Secondary | ICD-10-CM

## 2015-10-17 DIAGNOSIS — R509 Fever, unspecified: Secondary | ICD-10-CM

## 2015-10-17 DIAGNOSIS — R350 Frequency of micturition: Secondary | ICD-10-CM

## 2015-10-17 DIAGNOSIS — G893 Neoplasm related pain (acute) (chronic): Secondary | ICD-10-CM

## 2015-10-17 LAB — COMPREHENSIVE METABOLIC PANEL
ALT: 172 U/L — AB (ref 0–55)
ANION GAP: 7 meq/L (ref 3–11)
AST: 70 U/L — AB (ref 5–34)
Albumin: 3.4 g/dL — ABNORMAL LOW (ref 3.5–5.0)
Alkaline Phosphatase: 104 U/L (ref 40–150)
BILIRUBIN TOTAL: 0.35 mg/dL (ref 0.20–1.20)
BUN: 13.6 mg/dL (ref 7.0–26.0)
CHLORIDE: 103 meq/L (ref 98–109)
CO2: 27 meq/L (ref 22–29)
CREATININE: 1.2 mg/dL — AB (ref 0.6–1.1)
Calcium: 9.3 mg/dL (ref 8.4–10.4)
EGFR: 58 mL/min/{1.73_m2} — ABNORMAL LOW (ref >90)
GLUCOSE: 140 mg/dL (ref 70–140)
Potassium: 4.6 mEq/L (ref 3.5–5.1)
Sodium: 137 mEq/L (ref 136–145)
TOTAL PROTEIN: 6.8 g/dL (ref 6.4–8.3)

## 2015-10-17 LAB — CBC WITH DIFFERENTIAL/PLATELET
BASO%: 0.5 % (ref 0.0–2.0)
BASOS ABS: 0 10*3/uL (ref 0.0–0.1)
EOS ABS: 0.1 10*3/uL (ref 0.0–0.5)
EOS%: 1.6 % (ref 0.0–7.0)
HCT: 41.8 % (ref 34.8–46.6)
HGB: 13.8 g/dL (ref 11.6–15.9)
LYMPH%: 26.1 % (ref 14.0–49.7)
MCH: 27.2 pg (ref 25.1–34.0)
MCHC: 33 g/dL (ref 31.5–36.0)
MCV: 82.4 fL (ref 79.5–101.0)
MONO#: 0.1 10*3/uL (ref 0.1–0.9)
MONO%: 1.4 % (ref 0.0–14.0)
NEUT%: 70.4 % (ref 38.4–76.8)
NEUTROS ABS: 2.6 10*3/uL (ref 1.5–6.5)
NRBC: 0 % (ref 0–0)
PLATELETS: 157 10*3/uL (ref 145–400)
RBC: 5.07 10*6/uL (ref 3.70–5.45)
RDW: 13.9 % (ref 11.2–14.5)
WBC: 3.6 10*3/uL — AB (ref 3.9–10.3)
lymph#: 1 10*3/uL (ref 0.9–3.3)

## 2015-10-17 LAB — URINALYSIS, MICROSCOPIC - CHCC
BILIRUBIN (URINE): NEGATIVE
Glucose: NEGATIVE mg/dL
KETONES: NEGATIVE mg/dL
LEUKOCYTE ESTERASE: NEGATIVE
Nitrite: NEGATIVE
PH: 6.5 (ref 4.6–8.0)
Protein: 30 mg/dL
SPECIFIC GRAVITY, URINE: 1.015 (ref 1.003–1.035)
Urobilinogen, UR: 0.2 mg/dL (ref 0.2–1)

## 2015-10-17 MED ORDER — ONDANSETRON HCL 40 MG/20ML IJ SOLN: Freq: Once | INTRAMUSCULAR | Status: DC

## 2015-10-17 MED ORDER — SODIUM CHLORIDE 0.9 % IV SOLN: INTRAVENOUS | Status: AC

## 2015-10-17 MED ORDER — SODIUM CHLORIDE 0.9% FLUSH
10.0000 mL | INTRAVENOUS | Status: AC | PRN
  Administered 2015-10-17: 10 mL

## 2015-10-17 MED ORDER — SODIUM CHLORIDE 0.9 % IV SOLN
INTRAVENOUS | Status: AC
  Administered 2015-10-17: 13:00:00 via INTRAVENOUS

## 2015-10-17 MED ORDER — HEPARIN SOD (PORK) LOCK FLUSH 100 UNIT/ML IV SOLN
500.0000 [IU] | INTRAVENOUS | Status: AC | PRN
  Administered 2015-10-17: 500 [IU]
  Filled 2015-10-17: qty 5

## 2015-10-17 MED ORDER — SODIUM CHLORIDE 0.9 % IV SOLN
Freq: Once | INTRAVENOUS | Status: AC
  Administered 2015-10-17: 13:00:00 via INTRAVENOUS
  Filled 2015-10-17: qty 4

## 2015-10-17 NOTE — Telephone Encounter (Signed)
Patient called this morning stating that she "feels terrible, her body aches, running a fever 99.9 with taking tylenol alternating with advil".  Patient called yesterday c/o urinary frequency.  Patient to have labs and see Selena Lesser, NP in the [facility].

## 2015-10-17 NOTE — Progress Notes (Signed)
Diagnosis Association: Dehydration (276.51)  MD: Magrinat  Procedure: Pt's port was accessed with good blood return and pt received 1 liter of normal saline and a dose of Zofran.  Pt tolerated procedure well.  Post procedure: Pt was alert,oriented and ambulatory at discharge.

## 2015-10-17 NOTE — Discharge Instructions (Signed)
Pt received 1 liter of normal saline and a dose of Zofran today.

## 2015-10-18 ENCOUNTER — Other Ambulatory Visit: Payer: 59

## 2015-10-18 ENCOUNTER — Ambulatory Visit: Payer: 59 | Admitting: Nurse Practitioner

## 2015-10-18 LAB — URINE CULTURE

## 2015-10-19 ENCOUNTER — Encounter: Payer: Self-pay | Admitting: Nurse Practitioner

## 2015-10-19 DIAGNOSIS — E860 Dehydration: Secondary | ICD-10-CM | POA: Insufficient documentation

## 2015-10-19 DIAGNOSIS — G893 Neoplasm related pain (acute) (chronic): Secondary | ICD-10-CM | POA: Insufficient documentation

## 2015-10-19 NOTE — Assessment & Plan Note (Signed)
Patient has had decreased appetite and poor oral intake for the past few days since receiving her for cycle of chemotherapy.  She appears slightly dehydrated today; and is requesting IV fluid rehydration.  Patient will receive IV fluids today; was also encouraged to push fluids much as possible.

## 2015-10-19 NOTE — Assessment & Plan Note (Addendum)
Patient received cycle one of her Taxotere/carboplatin chemotherapy regimen on 10/11/2015.  She received Neulasta via on pro 2 days later.  Patient presents to the [facility] today with some bone pain and some mild dehydration.  She also feels that she had a low-grade fever; which has since resolved.  See further notes for details.  Patient is scheduled to return for labs, visit, chemotherapy on 11/01/2015.

## 2015-10-19 NOTE — Assessment & Plan Note (Signed)
Patient reports significant generalized aching and bone pain since receiving her first Injection last week.  She also feels that she was slightly feverish with a low-grade temperature of 99 over the weekend as well.  She is afebrile now.  Advised patient that the generalized aching bone pain is most likely secondary to the Neulasta; and will eventually subside.  Confirmed the patient is taking the Claritin on a daily basis.  Patient was offered some narcotic pain medications; but states she does have some hydrocodone to take at home if needed.

## 2015-10-19 NOTE — Progress Notes (Signed)
SYMPTOM MANAGEMENT CLINIC    Chief Complaint: Dehydration and pain  HPI:  Ashley Tyler 50 y.o. female diagnosed with breast cancer.  Currently going Taxotere/carboplatin chemotherapy regimen.   Patient received cycle one of her Taxotere/carboplatin chemotherapy regimen on 10/11/2015.  She received Neulasta via on pro 2 days later.  Patient presents to the [facility] today with some bone pain and some mild dehydration.  She also feels that she had a low-grade fever; which has since resolved.   No history exists.    Review of Systems  Constitutional: Positive for fever and malaise/fatigue. Negative for chills.  Musculoskeletal: Positive for myalgias.  All other systems reviewed and are negative.   Past Medical History  Diagnosis Date  . Headache   . Cancer ([place])     left breast  . Endometriosis   . Migraines   . Chronic constipation   . Family history of breast cancer   . Family history of prostate cancer     Past Surgical History  Procedure Laterality Date  . Cesarean section    . Tonsillectomy    . Laparoscopic salpingo oopherectomy Right   . Breast lumpectomy with radioactive seed and sentinel lymph node biopsy Left 08/28/2015    Procedure: LEFT BREAST LUMPECTOMY WITH RADIOACTIVE SEED AND SENTINEL LYMPH NODE BIOPSY;  Surgeon: [name], MD;  Location: [facility];  Service: General;  Laterality: Left;    has Breast cancer of upper-outer quadrant of left female breast ([place]); Family history of breast cancer; Family history of prostate cancer; Dehydration; and Neoplasm related pain on her problem list.    is allergic to tetracyclines & related and other.    Medication List       This list is accurate as of: 10/17/15 11:59 PM.  Always use your most recent med list.               dexamethasone 4 MG tablet  Commonly known as:  DECADRON  Take 8 mg by mouth 2 (two) times daily. Take 2 tablets (8 mg total) by mouth 2 (two) times  daily. Start the day before Taxotere. Then again the day after chemo for 3 days.     ibuprofen 200 MG tablet  Commonly known as:  ADVIL,MOTRIN  Take 400 mg by mouth daily as needed for headache. Reported on 10/08/2015     IODINE (KELP) PO  Take by mouth 2 (two) times daily.     lidocaine-prilocaine cream  Commonly known as:  EMLA  Apply 1 application topically as needed.     LORazepam 0.5 MG tablet  Commonly known as:  ATIVAN  Take 0.5 mg by mouth at bedtime as needed (Nausea/Vomiting). Take 1 tablet (0.5 mg total) by mouth at bedtime as needed (Nausea or vomiting).     ondansetron 8 MG tablet  Commonly known as:  ZOFRAN  Take 8 mg by mouth 2 (two) times daily as needed for nausea or vomiting. Take 1 tablet (8 mg total) by mouth 2 (two) times daily as needed for refractory nausea / vomiting. Start on day 3 after chemo.     prochlorperazine 10 MG tablet  Commonly known as:  COMPAZINE  Take 10 mg by mouth every 6 (six) hours as needed for nausea or vomiting.     ULTRAVATE X (OINTMENT) 0.05 & 10 % Kit  Generic drug:  Halobetasol Oint &Lactic Ac Cr  Apply 1 application topically daily as needed (eczema). Reported on 10/08/2015     UNABLE  TO FIND  Pt on multiple supplements - theraxym, turmero, burdock complex,  Herbal tonic ( calendula, poke root, echinacia, atstragulus ), withania complex, Vitanox, Herbavital, AC Glutahthione, Glutihione recycler  Above prescribed by [facility]  Dr Jewel Baize Ward     VITAMIN C PO  Take 1 tablet by mouth daily.         PHYSICAL EXAMINATION  Oncology Vitals 10/17/2015 10/11/2015  Height 166 cm -  Weight 78.971 kg -  Weight (lbs) 174 lbs 2 oz -  BMI (kg/m2) 28.53 kg/m2 -  Temp 98.4 97.5  Pulse 116 55  Resp 17 -  SpO2 100 100  BSA (m2) 1.91 m2 -   BP Readings from Last 2 Encounters:  10/17/15 113/66  10/11/15 117/78    Physical Exam  Constitutional: She is oriented to person, place, and time and well-developed,  well-nourished, and in no distress.  HENT:  Head: Normocephalic and atraumatic.  Mouth/Throat: Oropharynx is clear and moist.  Eyes: Conjunctivae and EOM are normal. Pupils are equal, round, and reactive to light. Right eye exhibits no discharge. Left eye exhibits no discharge. No scleral icterus.  Neck: Normal range of motion. Neck supple. No JVD present. No tracheal deviation present. No thyromegaly present.  Cardiovascular: Normal rate, regular rhythm, normal heart sounds and intact distal pulses.   Pulmonary/Chest: Effort normal and breath sounds normal. No respiratory distress. She has no wheezes. She has no rales. She exhibits no tenderness.  Abdominal: Soft. Bowel sounds are normal. She exhibits no distension and no mass. There is no tenderness. There is no rebound and no guarding.  Musculoskeletal: Normal range of motion. She exhibits no edema or tenderness.  Lymphadenopathy:    She has no cervical adenopathy.  Neurological: She is alert and oriented to person, place, and time. Gait normal.  Skin: Skin is warm and dry. No rash noted. No erythema. No pallor.  Psychiatric: Affect normal.  Nursing note and vitals reviewed.   LABORATORY DATA:. Appointment on 10/17/2015  Component Date Value Ref Range Status  . WBC 10/17/2015 3.6* 3.9 - 10.3 10e3/uL Final  . NEUT# 10/17/2015 2.6  1.5 - 6.5 10e3/uL Final  . HGB 10/17/2015 13.8  11.6 - 15.9 g/dL Final  . HCT 68/51/8799 41.8  34.8 - 46.6 % Final  . Platelets 10/17/2015 157  145 - 400 10e3/uL Final  . MCV 10/17/2015 82.4  79.5 - 101.0 fL Final  . MCH 10/17/2015 27.2  25.1 - 34.0 pg Final  . MCHC 10/17/2015 33.0  31.5 - 36.0 g/dL Final  . RBC 11/79/9405 5.07  3.70 - 5.45 10e6/uL Final  . RDW 10/17/2015 13.9  11.2 - 14.5 % Final  . lymph# 10/17/2015 1.0  0.9 - 3.3 10e3/uL Final  . MONO# 10/17/2015 0.1  0.1 - 0.9 10e3/uL Final  . Eosinophils Absolute 10/17/2015 0.1  0.0 - 0.5 10e3/uL Final  . Basophils Absolute 10/17/2015 0.0  0.0 - 0.1  10e3/uL Final  . NEUT% 10/17/2015 70.4  38.4 - 76.8 % Final  . LYMPH% 10/17/2015 26.1  14.0 - 49.7 % Final  . MONO% 10/17/2015 1.4  0.0 - 14.0 % Final  . EOS% 10/17/2015 1.6  0.0 - 7.0 % Final  . BASO% 10/17/2015 0.5  0.0 - 2.0 % Final  . nRBC 10/17/2015 0  0 - 0 % Final  . Sodium 10/17/2015 137  136 - 145 mEq/L Final  . Potassium 10/17/2015 4.6  3.5 - 5.1 mEq/L Final  . Chloride 10/17/2015 103  98 - 109 mEq/L Final  . CO2 10/17/2015 27  22 - 29 mEq/L Final  . Glucose 10/17/2015 140  70 - 140 mg/dl Final   Glucose reference range is for nonfasting patients. Fasting glucose reference range is 70- 100.  [name] BUN 10/17/2015 13.6  7.0 - 26.0 mg/dL Final  . Creatinine 10/17/2015 1.2* 0.6 - 1.1 mg/dL Final  . Total Bilirubin 10/17/2015 0.35  0.20 - 1.20 mg/dL Final  . Alkaline Phosphatase 10/17/2015 104  40 - 150 U/L Final  . AST 10/17/2015 70* 5 - 34 U/L Final  . ALT 10/17/2015 172* 0 - 55 U/L Final  . Total Protein 10/17/2015 6.8  6.4 - 8.3 g/dL Final  . Albumin 10/17/2015 3.4* 3.5 - 5.0 g/dL Final  . Calcium 10/17/2015 9.3  8.4 - 10.4 mg/dL Final  . Anion Gap 10/17/2015 7  3 - 11 mEq/L Final  . EGFR 10/17/2015 58* >90 ml/min/1.73 m2 Final   eGFR is calculated using the CKD-EPI Creatinine Equation (2009)  . Glucose 10/17/2015 Negative  Negative mg/dL Final  . Bilirubin (Urine) 10/17/2015 Negative  Negative Final  . Ketones 10/17/2015 Negative  Negative mg/dL Final  . Specific Gravity, Urine 10/17/2015 1.015  1.003 - 1.035 Final  . Blood 10/17/2015 Trace  Negative Final  . pH 10/17/2015 6.5  4.6 - 8.0 Final  . Protein 10/17/2015 30  Negative- <30 mg/dL Final  . Urobilinogen, UR 10/17/2015 0.2  0.2 - 1 mg/dL Final  . Nitrite 10/17/2015 Negative  Negative Final  . Leukocyte Esterase 10/17/2015 Negative  Negative Final  . RBC / HPF 10/17/2015 3-6  0 - 2 Final  . WBC, UA 10/17/2015 0-2  0 - 2 Final  . Bacteria, UA 10/17/2015 Few  Negative- Trace Final  . Epithelial Cells 10/17/2015 Moderate   Negative- Few Final  . Mucus, UA 10/17/2015 Small  Negative- Small Final  . Urine Culture, Routine 10/17/2015 Final report   Final  . Urine Culture result 1 10/17/2015 Comment   Final   Comment: Mixed urogenital flora 10,000-25,000 colony forming units per mL     RADIOGRAPHIC STUDIES: No results found.  ASSESSMENT/PLAN:    Breast cancer of upper-outer quadrant of left female breast [facility]) Patient received cycle one of her Taxotere/carboplatin chemotherapy regimen on 10/11/2015.  She received Neulasta via on pro 2 days later.  Patient presents to the [facility] today with some bone pain and some mild dehydration.  She also feels that she had a low-grade fever; which has since resolved.  See further notes for details.  Patient is scheduled to return for labs, visit, chemotherapy on 11/01/2015.  Dehydration Patient has had decreased appetite and poor oral intake for the past few days since receiving her for cycle of chemotherapy.  She appears slightly dehydrated today; and is requesting IV fluid rehydration.  Patient will receive IV fluids today; was also encouraged to push fluids much as possible.  Neoplasm related pain Patient reports significant generalized aching and bone pain since receiving her first Injection last week.  She also feels that she was slightly feverish with a low-grade temperature of 99 over the weekend as well.  She is afebrile now.  Advised patient that the generalized aching bone pain is most likely secondary to the Neulasta; and will eventually subside.  Confirmed the patient is taking the Claritin on a daily basis.  Patient was offered some narcotic pain medications; but states she does have some hydrocodone to take at home if needed.  Patient stated understanding of all instructions; and was in agreement with this plan of care. The patient knows to call the clinic with any problems, questions or concerns.   Total time spent with patient was 25 minutes;   with greater than 75 percent of that time spent in face to face counseling regarding patient's symptoms,  and coordination of care and follow up.  Disclaimer:This dictation was prepared with Dragon/digital dictation along with [employer]. Any transcriptional errors that result from this process are unintentional.  Drue Second, NP 10/19/2015

## 2015-10-24 ENCOUNTER — Encounter: Payer: Self-pay | Admitting: Oncology

## 2015-10-24 NOTE — Progress Notes (Signed)
forms left in box 10/22/15 °

## 2015-10-26 ENCOUNTER — Encounter: Payer: Self-pay | Admitting: Oncology

## 2015-10-26 NOTE — Progress Notes (Signed)
forms left in box 10/22/15- keft forms for dr. [name] to sign

## 2015-10-30 ENCOUNTER — Encounter: Payer: Self-pay | Admitting: Genetic Counselor

## 2015-10-30 ENCOUNTER — Telehealth: Payer: Self-pay | Admitting: Genetic Counselor

## 2015-10-30 DIAGNOSIS — Z1379 Encounter for other screening for genetic and chromosomal anomalies: Secondary | ICD-10-CM | POA: Insufficient documentation

## 2015-10-30 NOTE — Telephone Encounter (Signed)
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  Therefore the actual gene could have a deletion, or it could be deleted inside the pseudogene and it would not be a problem.  We would like to offer testing to other family members, this could be her parents or other affected family members.  Patient will bring her father in for testing when she comes in for chemo on June 22.

## 2015-10-31 ENCOUNTER — Other Ambulatory Visit: Payer: Self-pay | Admitting: *Deleted

## 2015-10-31 DIAGNOSIS — C50412 Malignant neoplasm of upper-outer quadrant of left female breast: Secondary | ICD-10-CM

## 2015-11-01 ENCOUNTER — Encounter: Payer: Self-pay | Admitting: Nurse Practitioner

## 2015-11-01 ENCOUNTER — Telehealth: Payer: Self-pay | Admitting: Nurse Practitioner

## 2015-11-01 ENCOUNTER — Other Ambulatory Visit (HOSPITAL_BASED_OUTPATIENT_CLINIC_OR_DEPARTMENT_OTHER): Payer: 59

## 2015-11-01 ENCOUNTER — Telehealth: Payer: Self-pay | Admitting: *Deleted

## 2015-11-01 ENCOUNTER — Ambulatory Visit: Payer: 59

## 2015-11-01 ENCOUNTER — Encounter: Payer: Self-pay | Admitting: Oncology

## 2015-11-01 ENCOUNTER — Other Ambulatory Visit: Payer: 59

## 2015-11-01 ENCOUNTER — Ambulatory Visit (HOSPITAL_BASED_OUTPATIENT_CLINIC_OR_DEPARTMENT_OTHER): Payer: 59 | Admitting: Nurse Practitioner

## 2015-11-01 VITALS — BP 114/67 | HR 84 | Temp 98.1°F | Resp 18 | Ht 65.5 in | Wt 179.9 lb

## 2015-11-01 DIAGNOSIS — R740 Nonspecific elevation of levels of transaminase and lactic acid dehydrogenase [LDH]: Secondary | ICD-10-CM | POA: Diagnosis not present

## 2015-11-01 DIAGNOSIS — C50412 Malignant neoplasm of upper-outer quadrant of left female breast: Secondary | ICD-10-CM

## 2015-11-01 DIAGNOSIS — R748 Abnormal levels of other serum enzymes: Secondary | ICD-10-CM

## 2015-11-01 LAB — COMPREHENSIVE METABOLIC PANEL
ALBUMIN: 3.8 g/dL (ref 3.5–5.0)
ALK PHOS: 138 U/L (ref 40–150)
ALT: 235 U/L — ABNORMAL HIGH (ref 0–55)
AST: 113 U/L — AB (ref 5–34)
Anion Gap: 10 mEq/L (ref 3–11)
BILIRUBIN TOTAL: 0.55 mg/dL (ref 0.20–1.20)
BUN: 18.6 mg/dL (ref 7.0–26.0)
CO2: 24 meq/L (ref 22–29)
Calcium: 9.8 mg/dL (ref 8.4–10.4)
Chloride: 105 mEq/L (ref 98–109)
Creatinine: 1 mg/dL (ref 0.6–1.1)
EGFR: 77 mL/min/{1.73_m2} — ABNORMAL LOW (ref >90)
GLUCOSE: 159 mg/dL — AB (ref 70–140)
Potassium: 4.7 mEq/L (ref 3.5–5.1)
SODIUM: 140 meq/L (ref 136–145)
TOTAL PROTEIN: 7.3 g/dL (ref 6.4–8.3)

## 2015-11-01 LAB — CBC WITH DIFFERENTIAL/PLATELET
BASO%: 0.2 % (ref 0.0–2.0)
BASOS ABS: 0 10*3/uL (ref 0.0–0.1)
EOS ABS: 0 10*3/uL (ref 0.0–0.5)
EOS%: 0 % (ref 0.0–7.0)
HCT: 38.1 % (ref 34.8–46.6)
HEMOGLOBIN: 12.4 g/dL (ref 11.6–15.9)
LYMPH#: 1.2 10*3/uL (ref 0.9–3.3)
LYMPH%: 10.5 % — ABNORMAL LOW (ref 14.0–49.7)
MCH: 26.8 pg (ref 25.1–34.0)
MCHC: 32.4 g/dL (ref 31.5–36.0)
MCV: 82.7 fL (ref 79.5–101.0)
MONO#: 0.2 10*3/uL (ref 0.1–0.9)
MONO%: 1.9 % (ref 0.0–14.0)
NEUT%: 87.4 % — ABNORMAL HIGH (ref 38.4–76.8)
NEUTROS ABS: 9.8 10*3/uL — AB (ref 1.5–6.5)
Platelets: 455 10*3/uL — ABNORMAL HIGH (ref 145–400)
RBC: 4.61 10*6/uL (ref 3.70–5.45)
RDW: 14.2 % (ref 11.2–14.5)
WBC: 11.2 10*3/uL — ABNORMAL HIGH (ref 3.9–10.3)

## 2015-11-01 MED ORDER — OMEPRAZOLE 40 MG PO CPDR: 40.0000 mg | DELAYED_RELEASE_CAPSULE | Freq: Every day | ORAL | Status: DC

## 2015-11-01 NOTE — Telephone Encounter (Signed)
appt made and avs printed °

## 2015-11-01 NOTE — Telephone Encounter (Signed)
  Oncology Nurse Navigator Documentation    Navigator Encounter Type: Telephone (11/01/15 1400) Telephone: Outgoing Call (11/01/15 1400)  Pt called to inform her FMLA was approved and pt appreciative for "all you do". Relate chemo delayed a week d/t counts. Discussed the reasoning. Received verbal understanding. Denies further needs at this time.                                      Time Spent with Patient: 15 (11/01/15 1400)

## 2015-11-01 NOTE — Progress Notes (Signed)
[facility]  [phone] [phone]   ID: Ashley Tyler DOB: [birth year]  MR#: [id number]  OQH#:[id number]  Patient Care Team: No Pcp Per Patient as PCP - General (General Practice) Ashley Cruel, MD as Consulting Physician (Oncology) Ashley Seltzer, MD as Consulting Physician (General Surgery) Ashley Gibson, MD as Attending Physician (Radiation Oncology) PCP: No PCP Per Patient GYN: OTHER MD:  CHIEF COMPLAINT: Estrogen receptor positive breast cancer  CURRENT TREATMENT: Adjuvant chemotherapy  BREAST CANCER HISTORY: From the original intake note:  Ashley Tyler had screening mammography at Dr. Caralee Ates office 07/24/2015 suggesting a left breast mass and the patient was referred to the [facility] where on 07/30/2015 she underwent left diagnostic mammography with tomosynthesis and left breast ultrasonography. The breast density was category C. In the upper outer quadrant of the left breast there was a 2.3 cm spiculated mass. There were no associated calcifications. This mass was palpable at the 1:00 position 5 cm from the nipple. Ultrasound confirmed an irregular hypoechoic mass measuring 1.8 cm. Ultrasonography of the left axilla was negative.  On 08/03/2015 the patient underwent biopsy of the left breast mass in question. The pathology ([id number]) showed an invasive ductal carcinoma, grade 3, estrogen receptor 60% positive, progesterone receptor 20% positive, both with strong staining intensity, with an MIB-1 of 70%, and HER-2 nonamplified, with a signals ratio of 1.18 and the number Purcell 2.00.  The patient then met with surgery and after appropriate discussion she proceeded to left lumpectomy and sentinel lymph node sampling 08/28/2015. The final pathology ([id number]) showed invasive ductal carcinoma, grade 3, measuring 2.1 cm. Margins were close but negative. All 3 sentinel lymph nodes were clear. Repeat HER-2 was again negative, with a  signals ratio of 0.9, and number per cell 2.20.  INTERVAL HISTORY: Ashley Tyler returns today for follow up of her breast cancer, accompanied by her daughter. She is here to discuss the antiemetic schedule for her upcoming chemotherapy regimen, cyclophosphamide and docetaxel x 4.   REVIEW OF SYSTEMS: She describes herself is moderately fatigued. She is anxious about starting chemotherapy. Aside from these issues a detailed review of systems today was noncontributory   PAST MEDICAL HISTORY: Past Medical History  Diagnosis Date  . Headache   . Cancer ([place])     left breast  . Endometriosis   . Migraines   . Chronic constipation   . Family history of breast cancer   . Family history of prostate cancer     PAST SURGICAL HISTORY: Past Surgical History  Procedure Laterality Date  . Cesarean section    . Tonsillectomy    . Laparoscopic salpingo oopherectomy Right   . Breast lumpectomy with radioactive seed and sentinel lymph node biopsy Left 08/28/2015    Procedure: LEFT BREAST LUMPECTOMY WITH RADIOACTIVE SEED AND SENTINEL LYMPH NODE BIOPSY;  Surgeon: Ashley Seltzer, MD;  Location: [facility];  Service: General;  Laterality: Left;    FAMILY HISTORY Family History  Problem Relation Age of Onset  . Prostate cancer Father 42    Gleason = 7  . Prostate cancer Maternal Uncle     dx in his 75s  . Breast cancer Paternal Aunt     dx in her 43s  . Prostate cancer Paternal Uncle   . Alzheimer's disease Maternal Grandmother   . Lung cancer Maternal Grandfather   . Congestive Heart Failure Paternal Grandmother   . Prostate cancer Maternal Uncle     dx in his 28s  . Breast cancer  Paternal Aunt     dx in her 34s; maternal half paternal aunt  . Prostate cancer Paternal Uncle     dx in his 38s  . Breast cancer Cousin   . Breast cancer Cousin     father's maternal cousin's daughter; dx in her 17s-30s  The patient's parents are still living, in their early 61s as of April 2016.  The patient is an only child. On her father's side there are 4 cases of breast cancer, 2 aunts diagnosed in their 22s and 63s, and 2 cousins diagnosed in her 47s and one at age 51. The patient's father had prostate cancer diagnosed age 22.  GYNECOLOGIC HISTORY:  No LMP recorded. Menarche age 44, first live birth age 7. The patient is GX P2, although both children were born early. She is status post right salpingo-oophorectomy due to endometriosis. 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 At home is just she and her daughter Ashley Tyler, 62. Her other daughter, Ashley Tyler, is studying criminal justice at [employer]. Her SO is this is with. Ashley Tyler    ADVANCED DIRECTIVES: Not in place   HEALTH MAINTENANCE: Social History  Substance Use Topics  . Smoking status: Never Smoker   . Smokeless tobacco: None  . Alcohol Use: Yes     Comment: social     Colonoscopy:  PAP:  Bone density:  Lipid panel:  Allergies  Allergen Reactions  . Tetracyclines & Related Itching  . Other Rash    Powder in gloves causes rash    Current Outpatient Prescriptions  Medication Sig Dispense Refill  . Ascorbic Acid (VITAMIN C PO) Take 1 tablet by mouth daily.    [name] dexamethasone (DECADRON) 4 MG tablet Take 8 mg by mouth 2 (two) times daily. Take 2 tablets (8 mg total) by mouth 2 (two) times daily. Start the day before Taxotere. Then again the day after chemo for 3 days.    . Halobetasol Oint &Lactic Ac Cr (ULTRAVATE X, OINTMENT,) 0.05 & 10 % KIT Apply 1 application topically daily as needed (eczema). Reported on 10/08/2015    . ibuprofen (ADVIL,MOTRIN) 200 MG tablet Take 400 mg by mouth daily as needed for headache. Reported on 10/08/2015    . IODINE, KELP, PO Take by mouth 2 (two) times daily.    [name] lidocaine-prilocaine (EMLA) cream Apply 1 application topically as needed.    [name]  LORazepam (ATIVAN) 0.5 MG tablet Take 0.5 mg by mouth at bedtime as needed (Nausea/Vomiting). Take 1 tablet (0.5 mg total) by mouth at bedtime as needed (Nausea or vomiting).    [name] omeprazole (PRILOSEC) 40 MG capsule Take 1 capsule (40 mg total) by mouth daily. 30 capsule 1  . ondansetron (ZOFRAN) 8 MG tablet Take 8 mg by mouth 2 (two) times daily as needed for nausea or vomiting. Take 1 tablet (8 mg total) by mouth 2 (two) times daily as needed for refractory nausea / vomiting. Start on day 3 after chemo.    . prochlorperazine (COMPAZINE) 10 MG tablet Take 10 mg by mouth every 6 (six) hours as needed for nausea or vomiting.    [name] UNABLE TO FIND Pt on multiple supplements - theraxym, turmero, burdock complex,  Herbal tonic ( calendula, poke root, echinacia, atstragulus ), withania complex, Vitanox, Herbavital, AC Glutahthione, Glutihione recycler  Above prescribed by [facility]  Dr  Darcy Ward     No current facility-administered medications for this visit.    OBJECTIVE: Middle-aged African-American woman In no acute distress Filed Vitals:   11/01/15 0835  BP: 114/67  Pulse: 84  Temp: 98.1 F (36.7 C)  Resp: 18     Body mass index is 29.47 kg/(m^2).    ECOG FS:0 - Asymptomatic  Exam not repeated today  LAB RESULTS:  CMP     Component Value Date/Time   NA 140 11/01/2015 0822   NA 141 11/13/2013 2008   K 4.7 11/01/2015 0822   K 4.0 11/13/2013 2008   CL 103 11/13/2013 2008   CO2 24 11/01/2015 0822   CO2 25 11/13/2013 2008   GLUCOSE 159* 11/01/2015 0822   GLUCOSE 91 11/13/2013 2008   BUN 18.6 11/01/2015 0822   BUN 13 11/13/2013 2008   CREATININE 1.0 11/01/2015 0822   CREATININE 1.05 11/13/2013 2008   CALCIUM 9.8 11/01/2015 0822   CALCIUM 9.0 11/13/2013 2008   PROT 7.3 11/01/2015 0822   ALBUMIN 3.8 11/01/2015 0822   AST 113* 11/01/2015 0822   ALT 235* 11/01/2015 0822   ALKPHOS 138 11/01/2015 0822   BILITOT 0.55 11/01/2015 0822   GFRNONAA 61*  11/13/2013 2008   GFRAA 71* 11/13/2013 2008    INo results found for: SPEP, UPEP  Lab Results  Component Value Date   WBC 11.2* 11/01/2015   NEUTROABS 9.8* 11/01/2015   HGB 12.4 11/01/2015   HCT 38.1 11/01/2015   MCV 82.7 11/01/2015   PLT 455* 11/01/2015      Chemistry      Component Value Date/Time   NA 140 11/01/2015 0822   NA 141 11/13/2013 2008   K 4.7 11/01/2015 0822   K 4.0 11/13/2013 2008   CL 103 11/13/2013 2008   CO2 24 11/01/2015 0822   CO2 25 11/13/2013 2008   BUN 18.6 11/01/2015 0822   BUN 13 11/13/2013 2008   CREATININE 1.0 11/01/2015 0822   CREATININE 1.05 11/13/2013 2008      Component Value Date/Time   CALCIUM 9.8 11/01/2015 0822   CALCIUM 9.0 11/13/2013 2008   ALKPHOS 138 11/01/2015 0822   AST 113* 11/01/2015 0822   ALT 235* 11/01/2015 0822   BILITOT 0.55 11/01/2015 0822       No results found for: LABCA2  No components found for: LABCA125  No results for input(s): INR in the last 168 hours.  Urinalysis    Component Value Date/Time   COLORURINE YELLOW 11/13/2013 2030   APPEARANCEUR CLOUDY* 11/13/2013 2030   LABSPEC 1.015 10/17/2015 1102   LABSPEC 1.013 11/13/2013 2030   PHURINE 6.5 10/17/2015 1102   PHURINE 5.5 11/13/2013 2030   GLUCOSEU Negative 10/17/2015 1102   GLUCOSEU NEGATIVE 11/13/2013 2030   HGBUR Trace 10/17/2015 [place] NEGATIVE 11/13/2013 2030   BILIRUBINUR Negative 10/17/2015 [place] NEGATIVE 11/13/2013 2030   KETONESUR Negative 10/17/2015 1102   KETONESUR 15* 11/13/2013 2030   PROTEINUR 30 10/17/2015 1102   PROTEINUR NEGATIVE 11/13/2013 2030   UROBILINOGEN 0.2 10/17/2015 1102   UROBILINOGEN 0.2 11/13/2013 2030   NITRITE Negative 10/17/2015 1102   NITRITE NEGATIVE 11/13/2013 2030   LEUKOCYTESUR Negative 10/17/2015 1102   LEUKOCYTESUR TRACE* 11/13/2013 2030      ELIGIBLE FOR AVAILABLE RESEARCH PROTOCOL: PALLAS  STUDIES: No results found.  ASSESSMENT: 50 y.o. [place] woman status post left  breast upper outer quadrant biopsy 08/03/2015 for a clinical T2 N0, stage IIa invasive ductal carcinoma, grade 3,  estrogen and progesterone receptor positive, HER-2 not amplified, with an MIB-1 of 70%  (1) status post left lumpectomy and sentinel lymph node sampling 08/28/2015 for a pT2 pN0, stage IIA invasive ductal carcinoma, grade 3, with close but negative margins. Repeat HER-2 was again negative  (a) oncotype DX Score of 22 predicts a risk of recurrence outside the breast within 10 years of 14% if the patient's only systemic therapy is tamoxifen for 5 years. The addition of CMF reduced the risk an additional 4 %  (2) adjuvant chemotherapy to consist of cyclophosphamide and docetaxel 4 given 21 days apart, with on Pro support  (3) adjuvant radiation to follow chemotherapy  (4) adjuvant anti-estrogens to follow radiation, with consideration of the PALLAS trial   (5) genetics testing scheduled for 10/01/2015    PLAN: Ashley Tyler and I spent over 40 minutes discussing her upcoming chemotherapy plans. She was made aware of potential side effects and toxicities cyclophosphamide and docetaxel. She was made aware of her neulasta injections, including the fact that these will be administered with an onbody injector that she will need to wear until the device is no longer active. She attended chemotherapy school last week, so a lot of this information was review for her.   Finally, we reviewed her antiemetic schedule, and she feels comfortable with every medication listed as well as the indication and potential side effects. These prescriptions were sent to her pharmacy today.   Ashley Tyler will return on Thursday for her first cycle of treatment. She understands and agrees with this plan. She knows the goal of treatment in her case is cure. She has been encouraged to call with any issues that might arise before her next visit here  Laurie Panda, NP   11/01/2015 11:15 AM

## 2015-11-01 NOTE — Addendum Note (Signed)
Addended by: Marcelino Duster on: 11/01/2015 11:16 AM   Modules accepted: Orders

## 2015-11-01 NOTE — Progress Notes (Signed)
[facility]  Telephone:([phone]) [phone] Fax:([phone]) [phone]   ID: Ashley Tyler DOB: 1965/07/23  MR#: [id number]  CPJ#:[id number]  Patient Care Team: No Pcp Per Patient as PCP - General (General Practice) [name], MD as Consulting Physician (Oncology) [name], MD as Consulting Physician (General Surgery) [name], MD as Attending Physician (Radiation Oncology) PCP: No PCP Per Patient GYN: OTHER MD:  CHIEF COMPLAINT: Estrogen receptor positive breast cancer  CURRENT TREATMENT: Adjuvant chemotherapy  BREAST CANCER HISTORY: From the original intake note:  Ashley Tyler had screening mammography at Dr. Lilian Coma office 07/24/2015 suggesting a left breast mass and the patient was referred to the [facility] where on 07/30/2015 she underwent left diagnostic mammography with tomosynthesis and left breast ultrasonography. The breast density was category C. In the upper outer quadrant of the left breast there was a 2.3 cm spiculated mass. There were no associated calcifications. This mass was palpable at the 1:00 position 5 cm from the nipple. Ultrasound confirmed an irregular hypoechoic mass measuring 1.8 cm. Ultrasonography of the left axilla was negative.  On 08/03/2015 the patient underwent biopsy of the left breast mass in question. 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  The patient then met with surgery and after appropriate discussion she proceeded to left lumpectomy and sentinel lymph node sampling 08/28/2015. The final pathology ([id number]) showed invasive ductal carcinoma, grade 3, measuring 2.1 cm. Margins were close but negative. All 3 sentinel lymph nodes were clear. Repeat HER-2 was again negative, with a  signals ratio of 0.9, and number per cell 2.20.  INTERVAL HISTORY: Ashley Tyler returns today for follow up of her breast cancer, accompanied by her daughter. Today is day 1, cycle 2 of cyclophosphamide and docetaxel, with neulasta for granulocyte support.   REVIEW OF SYSTEMS: Ashley Tyler denies fevers, chills, nausea, or vomiting. She is moving her bowels well. Her bone pain resolved with NSAID use. She denies mouth sores, rashes, or neuropathy symptoms. Her energy level is improved. She is not sleeping well because of the dexamethasone, some times it causes her heart to race. It is not doing so today. A detailed review of systems is otherwise stable.   PAST MEDICAL HISTORY: Past Medical History  Diagnosis Date  . Headache   . Cancer (HCC)     left breast  . Endometriosis   . Migraines   . Chronic constipation   . Family history of breast cancer   . Family history of prostate cancer     PAST SURGICAL HISTORY: Past Surgical History  Procedure Laterality Date  . Cesarean section    . Tonsillectomy    . Laparoscopic salpingo oopherectomy Right   . Breast lumpectomy with radioactive seed and sentinel lymph node biopsy Left 08/28/2015    Procedure: LEFT BREAST LUMPECTOMY WITH RADIOACTIVE SEED AND SENTINEL LYMPH NODE BIOPSY;  Surgeon: [name], MD;  Location: [facility];  Service: General;  Laterality: Left;    FAMILY HISTORY Family History  Problem Relation Age of Onset  . Prostate cancer Father 23    Gleason = 7  . Prostate cancer Maternal Uncle     dx in his 61s  . Breast cancer Paternal Aunt     dx in her 7s  . Prostate cancer Paternal Uncle   . Alzheimer's  disease Maternal Grandmother   . Lung cancer Maternal Grandfather   . Congestive Heart Failure Paternal Grandmother   . Prostate cancer Maternal Uncle     dx in his 55s  . Breast cancer Paternal Aunt     dx in her 48s; maternal half paternal aunt  . Prostate cancer Paternal Uncle     dx in his 63s    . Breast cancer Cousin   . Breast cancer Cousin     father's maternal cousin's daughter; dx in her 74s-30s  The patient's parents are still living, in their early 63s as of April 2016. The patient is an only child. On her father's side there are 4 cases of breast cancer, 2 aunts diagnosed in their 16s and 54s, and 2 cousins diagnosed in her 64s and one at age 57. The patient's father had prostate cancer diagnosed age 27.  GYNECOLOGIC HISTORY:  No LMP recorded. Menarche age 59, first live birth age 57. The patient is GX P2, although both children were born early. She is status post right salpingo-oophorectomy due to endometriosis. 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 At home is just she and her daughter Ashley Tyler, 47. Her other daughter, Ashley Tyler, is studying criminal justice at [employer]. Her SO is this is with. Ashley Tyler    ADVANCED DIRECTIVES: Not in place   HEALTH MAINTENANCE: Social History  Substance Use Topics  . Smoking status: Never Smoker   . Smokeless tobacco: Not on file  . Alcohol Use: Yes     Comment: social     Colonoscopy:  PAP:  Bone density:  Lipid panel:  Allergies  Allergen Reactions  . Tetracyclines & Related Itching  . Other Rash    Powder in gloves causes rash    Current Outpatient Prescriptions  Medication Sig Dispense Refill  . Ascorbic Acid (VITAMIN C PO) Take 1 tablet by mouth daily.    [name] dexamethasone (DECADRON) 4 MG tablet Take 8 mg by mouth 2 (two) times daily. Take 2 tablets (8 mg total) by mouth 2 (two) times daily. Start the day before Taxotere. Then again the day after chemo for 3 days.    . Halobetasol Oint &Lactic Ac Cr (ULTRAVATE X, OINTMENT,) 0.05 & 10 % KIT Apply 1 application topically daily as needed (eczema). Reported on 10/08/2015    . ibuprofen (ADVIL,MOTRIN) 200 MG tablet Take 400 mg by mouth  daily as needed for headache. Reported on 10/08/2015    . IODINE, KELP, PO Take by mouth 2 (two) times daily.    [name] lidocaine-prilocaine (EMLA) cream Apply 1 application topically as needed.    [name] LORazepam (ATIVAN) 0.5 MG tablet Take 0.5 mg by mouth at bedtime as needed (Nausea/Vomiting). Take 1 tablet (0.5 mg total) by mouth at bedtime as needed (Nausea or vomiting).    . ondansetron (ZOFRAN) 8 MG tablet Take 8 mg by mouth 2 (two) times daily as needed for nausea or vomiting. Take 1 tablet (8 mg total) by mouth 2 (two) times daily as needed for refractory nausea / vomiting. Start on day 3 after chemo.    . prochlorperazine (COMPAZINE) 10 MG tablet Take 10 mg by mouth every 6 (six) hours as needed for nausea or vomiting.    [name] UNABLE TO FIND Pt on multiple supplements - theraxym, turmero, burdock complex,  Herbal tonic ( calendula, poke root, echinacia,  atstragulus ), withania complex, Vitanox, Herbavital, AC Glutahthione, Glutihione recycler  Above prescribed by [facility]  Dr Salomon Fick Ward     No current facility-administered medications for this visit.    OBJECTIVE: Middle-aged African-American woman In no acute distress Filed Vitals:   11/01/15 0835  BP: 114/67  Pulse: 84  Temp: 98.1 F (36.7 C)  Resp: 18     Body mass index is 29.47 kg/(m^2).    ECOG FS:0 - Asymptomatic  Skin: warm, dry  HEENT: sclerae anicteric, conjunctivae pink, oropharynx clear. No thrush or mucositis.  Lymph Nodes: No cervical or supraclavicular lymphadenopathy  Lungs: clear to auscultation bilaterally, no rales, wheezes, or rhonci  Heart: regular rate and rhythm  Abdomen: round, soft, non tender, positive bowel sounds  Musculoskeletal: No focal spinal tenderness, no peripheral edema  Neuro: non focal, well oriented, positive affect  Breasts: deferred  LAB RESULTS:  CMP     Component Value Date/Time   NA 137 10/17/2015 1102   NA 141 11/13/2013 2008   K 4.6 10/17/2015 1102   K 4.0  11/13/2013 2008   CL 103 11/13/2013 2008   CO2 27 10/17/2015 1102   CO2 25 11/13/2013 2008   GLUCOSE 140 10/17/2015 1102   GLUCOSE 91 11/13/2013 2008   BUN 13.6 10/17/2015 1102   BUN 13 11/13/2013 2008   CREATININE 1.2* 10/17/2015 1102   CREATININE 1.05 11/13/2013 2008   CALCIUM 9.3 10/17/2015 1102   CALCIUM 9.0 11/13/2013 2008   PROT 6.8 10/17/2015 1102   ALBUMIN 3.4* 10/17/2015 1102   AST 70* 10/17/2015 1102   ALT 172* 10/17/2015 1102   ALKPHOS 104 10/17/2015 1102   BILITOT 0.35 10/17/2015 1102   GFRNONAA 61* 11/13/2013 2008   GFRAA 71* 11/13/2013 2008    INo results found for: SPEP, UPEP  Lab Results  Component Value Date   WBC 11.2* 11/01/2015   NEUTROABS 9.8* 11/01/2015   HGB 12.4 11/01/2015   HCT 38.1 11/01/2015   MCV 82.7 11/01/2015   PLT 455* 11/01/2015      Chemistry      Component Value Date/Time   NA 137 10/17/2015 1102   NA 141 11/13/2013 2008   K 4.6 10/17/2015 1102   K 4.0 11/13/2013 2008   CL 103 11/13/2013 2008   CO2 27 10/17/2015 1102   CO2 25 11/13/2013 2008   BUN 13.6 10/17/2015 1102   BUN 13 11/13/2013 2008   CREATININE 1.2* 10/17/2015 1102   CREATININE 1.05 11/13/2013 2008      Component Value Date/Time   CALCIUM 9.3 10/17/2015 1102   CALCIUM 9.0 11/13/2013 2008   ALKPHOS 104 10/17/2015 1102   AST 70* 10/17/2015 1102   ALT 172* 10/17/2015 1102   BILITOT 0.35 10/17/2015 1102       No results found for: LABCA2  No components found for: LABCA125  No results for input(s): INR in the last 168 hours.  Urinalysis    Component Value Date/Time   COLORURINE YELLOW 11/13/2013 2030   APPEARANCEUR CLOUDY* 11/13/2013 2030   LABSPEC 1.015 10/17/2015 1102   LABSPEC 1.013 11/13/2013 2030   PHURINE 6.5 10/17/2015 1102   PHURINE 5.5 11/13/2013 2030   GLUCOSEU Negative 10/17/2015 [place] 11/13/2013 2030   HGBUR Trace 10/17/2015 [place] NEGATIVE 11/13/2013 2030   BILIRUBINUR Negative 10/17/2015 [place]  NEGATIVE 11/13/2013 2030   KETONESUR Negative 10/17/2015 1102   KETONESUR 15* 11/13/2013 2030   PROTEINUR 30 10/17/2015  [place] 11/13/2013 2030   UROBILINOGEN 0.2 10/17/2015 1102   UROBILINOGEN 0.2 11/13/2013 2030   NITRITE Negative 10/17/2015 1102   NITRITE NEGATIVE 11/13/2013 2030   LEUKOCYTESUR Negative 10/17/2015 1102   LEUKOCYTESUR TRACE* 11/13/2013 2030      ELIGIBLE FOR AVAILABLE RESEARCH PROTOCOL: PALLAS  STUDIES: No results found.  ASSESSMENT: 50 y.o. Amo woman status post left breast upper outer quadrant biopsy 08/03/2015 for a clinical T2 N0, stage IIa invasive ductal carcinoma, grade 3, estrogen and progesterone receptor positive, HER-2 not amplified, with an MIB-1 of 70%  (1) status post left lumpectomy and sentinel lymph node sampling 08/28/2015 for a pT2 pN0, stage IIA invasive ductal carcinoma, grade 3, with close but negative margins. Repeat HER-2 was again negative  (a) oncotype DX Score of 22 predicts a risk of recurrence outside the breast within 10 years of 14% if the patient's only systemic therapy is tamoxifen for 5 years. The addition of CMF reduced the risk an additional 4 %  (2) adjuvant chemotherapy to consist of cyclophosphamide and docetaxel 4 given 21 days apart, with on Pro support  (3) adjuvant radiation to follow chemotherapy  (4) adjuvant anti-estrogens to follow radiation, with consideration of the PALLAS trial   (5) genetics testing scheduled for 10/01/2015    PLAN: Ashley Tyler is back to her baseline today. Unfortunately, the labs were reviewed in detail and her AST and ALT continue to rise. I consulted with Dr. [name]. He suggests holding treatment at this time. When she returns next week, we will likely initiate cyclophosphamide and doxorubicin instead of docetaxel.  Ashley Tyler was disappointed, but understands and agrees with this plan. She knows the goal of treatment in her case is cure. She has been encouraged to  call with any issues that might arise before her next visit here  Laurie Panda, NP   11/01/2015 8:45 AM

## 2015-11-01 NOTE — Progress Notes (Signed)
Met with patient in my office today who brought income information for [employer]. Patient approved for one-time $1000 grant. Patient has copy of award as well as expenses it covers and our Outpatient Pharmacy information. Patient may not have a need for any other foundation assistance at this time. Patient has my card for any additional financial questions or concerns.

## 2015-11-07 ENCOUNTER — Other Ambulatory Visit: Payer: Self-pay | Admitting: *Deleted

## 2015-11-07 DIAGNOSIS — C50412 Malignant neoplasm of upper-outer quadrant of left female breast: Secondary | ICD-10-CM

## 2015-11-08 ENCOUNTER — Ambulatory Visit: Payer: 59

## 2015-11-08 ENCOUNTER — Other Ambulatory Visit (HOSPITAL_BASED_OUTPATIENT_CLINIC_OR_DEPARTMENT_OTHER): Payer: 59

## 2015-11-08 ENCOUNTER — Telehealth: Payer: Self-pay | Admitting: Oncology

## 2015-11-08 ENCOUNTER — Ambulatory Visit (HOSPITAL_BASED_OUTPATIENT_CLINIC_OR_DEPARTMENT_OTHER): Payer: 59 | Admitting: Oncology

## 2015-11-08 VITALS — BP 118/79 | HR 85 | Temp 97.7°F | Resp 18 | Ht 65.5 in | Wt 179.8 lb

## 2015-11-08 DIAGNOSIS — T451X5A Adverse effect of antineoplastic and immunosuppressive drugs, initial encounter: Secondary | ICD-10-CM

## 2015-11-08 DIAGNOSIS — M898X9 Other specified disorders of bone, unspecified site: Secondary | ICD-10-CM | POA: Diagnosis not present

## 2015-11-08 DIAGNOSIS — C50412 Malignant neoplasm of upper-outer quadrant of left female breast: Secondary | ICD-10-CM

## 2015-11-08 DIAGNOSIS — K7689 Other specified diseases of liver: Secondary | ICD-10-CM | POA: Diagnosis not present

## 2015-11-08 LAB — CBC WITH DIFFERENTIAL/PLATELET
BASO%: 0.3 % (ref 0.0–2.0)
BASOS ABS: 0 10*3/uL (ref 0.0–0.1)
EOS ABS: 0 10*3/uL (ref 0.0–0.5)
EOS%: 0 % (ref 0.0–7.0)
HCT: 37.7 % (ref 34.8–46.6)
HGB: 12.3 g/dL (ref 11.6–15.9)
LYMPH%: 9.8 % — AB (ref 14.0–49.7)
MCH: 27.6 pg (ref 25.1–34.0)
MCHC: 32.7 g/dL (ref 31.5–36.0)
MCV: 84.4 fL (ref 79.5–101.0)
MONO#: 0.5 10*3/uL (ref 0.1–0.9)
MONO%: 4.6 % (ref 0.0–14.0)
NEUT#: 9.9 10*3/uL — ABNORMAL HIGH (ref 1.5–6.5)
NEUT%: 85.3 % — AB (ref 38.4–76.8)
PLATELETS: 365 10*3/uL (ref 145–400)
RBC: 4.47 10*6/uL (ref 3.70–5.45)
RDW: 15.7 % — ABNORMAL HIGH (ref 11.2–14.5)
WBC: 11.7 10*3/uL — AB (ref 3.9–10.3)
lymph#: 1.1 10*3/uL (ref 0.9–3.3)

## 2015-11-08 LAB — COMPREHENSIVE METABOLIC PANEL
ALK PHOS: 124 U/L (ref 40–150)
ALT: 160 U/L — ABNORMAL HIGH (ref 0–55)
ANION GAP: 11 meq/L (ref 3–11)
AST: 70 U/L — ABNORMAL HIGH (ref 5–34)
Albumin: 3.8 g/dL (ref 3.5–5.0)
BILIRUBIN TOTAL: 0.57 mg/dL (ref 0.20–1.20)
BUN: 15.8 mg/dL (ref 7.0–26.0)
CO2: 22 meq/L (ref 22–29)
Calcium: 9.7 mg/dL (ref 8.4–10.4)
Chloride: 105 mEq/L (ref 98–109)
Creatinine: 0.9 mg/dL (ref 0.6–1.1)
EGFR: 84 mL/min/{1.73_m2} — AB (ref >90)
GLUCOSE: 109 mg/dL (ref 70–140)
POTASSIUM: 4.4 meq/L (ref 3.5–5.1)
SODIUM: 137 meq/L (ref 136–145)
TOTAL PROTEIN: 7.5 g/dL (ref 6.4–8.3)

## 2015-11-08 NOTE — Telephone Encounter (Signed)
appt made and avs printed °

## 2015-11-08 NOTE — Progress Notes (Signed)
[facility]  Telephone:([phone] [phone]) [phone]   ID: Ashley Tyler DOB: [birth year]  MR#: [id number]  [facility]#:[id number]  Patient Care Team: No Pcp Per Patient as PCP - General (General Practice) [name], MD as Consulting Physician (Oncology) [name], MD as Consulting Physician (General Surgery) [name], MD as Attending Physician (Radiation Oncology) PCP: No PCP Per Patient GYN: OTHER MD:  CHIEF COMPLAINT: Estrogen receptor positive breast cancer  CURRENT TREATMENT: Adjuvant chemotherapy  BREAST CANCER HISTORY: From the original intake note:  Ashley Tyler had screening mammography at Dr. Lilian Coma office 07/24/2015 suggesting a left breast mass and the patient was referred to the [facility] where on 07/30/2015 she underwent left diagnostic mammography with tomosynthesis and left breast ultrasonography. The breast density was category C. In the upper outer quadrant of the left breast there was a 2.3 cm spiculated mass. There were no associated calcifications. This mass was palpable at the 1:00 position 5 cm from the nipple. Ultrasound confirmed an irregular hypoechoic mass measuring 1.8 cm. Ultrasonography of the left axilla was negative.  On 08/03/2015 the patient underwent biopsy of the left breast mass in question. 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  The patient then met with surgery and after appropriate discussion she proceeded to left lumpectomy and sentinel lymph node sampling 08/28/2015. The final pathology ([id number]) showed invasive ductal carcinoma, grade 3, measuring 2.1 cm. Margins were close but negative. All 3 sentinel lymph nodes were clear. Repeat HER-2 was again negative, with a  signals ratio of 0.9, and number per cell 2.20.  INTERVAL HISTORY: [name] returns today for follow up of her breast cancer, accompanied by her oldest daughter. Today is day 29 cycle 1 of clophosphamide and docetaxel to be given every 21 days 4. However after cycle 1 the patient's liver function tests significantly increased and her cycle 2 was held. She is here today to discuss alternative regimens.[name]   REVIEW OF SYSTEMS: She had a great deal of bony pain from the Neulasta, but she was only taking Claritin. When she started taking Motrin the pain significantly decreased. She did not have problems with nausea or vomiting. She did miss several days of work. She had no mouth sores. She had no change in bowel or bladder habits. She did not have a rash. A detailed review of systems today was otherwise stable.  PAST MEDICAL HISTORY: Past Medical History  Diagnosis Date  . Headache   . Cancer (HCC)     left breast  . Endometriosis   . Migraines   . Chronic constipation   . Family history of breast cancer   . Family history of prostate cancer     PAST SURGICAL HISTORY: Past Surgical History  Procedure Laterality Date  . Cesarean section    . Tonsillectomy    . Laparoscopic salpingo oopherectomy Right   . Breast lumpectomy with radioactive seed and sentinel lymph node biopsy Left 08/28/2015    Procedure: LEFT BREAST LUMPECTOMY WITH RADIOACTIVE SEED AND SENTINEL LYMPH NODE BIOPSY;  Surgeon: [name], MD;  Location: [facility];  Service: General;  Laterality: Left;    FAMILY HISTORY Family History  Problem Relation Age of Onset  . Prostate cancer Father 80    Gleason = 7  . Prostate cancer  Maternal Uncle     dx in his 89s  . Breast cancer Paternal Aunt     dx in her 56s  . Prostate cancer Paternal Uncle   . Alzheimer's disease Maternal Grandmother   . Lung cancer Maternal Grandfather   . Congestive Heart Failure Paternal Grandmother   . Prostate cancer  Maternal Uncle     dx in his 36s  . Breast cancer Paternal Aunt     dx in her 12s; maternal half paternal aunt  . Prostate cancer Paternal Uncle     dx in his 58s  . Breast cancer Cousin   . Breast cancer Cousin     father's maternal cousin's daughter; dx in her 71s-30s  The patient's parents are still living, in their early 40s as of April 2016. The patient is an only child. On her father's side there are 4 cases of breast cancer, 2 aunts diagnosed in their 56s and 63s, and 2 cousins diagnosed in her 52s and one at age 41. The patient's father had prostate cancer diagnosed age 26.  GYNECOLOGIC HISTORY:  No LMP recorded. Menarche age 79, first live birth age 6. The patient is GX P2, although both children were born early. She is status post right salpingo-oophorectomy due to endometriosis. 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 She is divorced. At home is just she and her daughter Ashley Tyler, 11. Her other daughter, Ashley Tyler, is studying criminal justice at [employer]. Her SO is this is with. [name]    ADVANCED DIRECTIVES: Not in place   HEALTH MAINTENANCE: Social History  Substance Use Topics  . Smoking status: Never Smoker   . Smokeless tobacco: Not on file  . Alcohol Use: Yes     Comment: social     Colonoscopy:  PAP:  Bone density:  Lipid panel:  Allergies  Allergen Reactions  . Tetracyclines & Related Itching  . Other Rash    Powder in gloves causes rash    Current Outpatient Prescriptions  Medication Sig Dispense Refill  . Ascorbic Acid (VITAMIN C PO) Take 1 tablet by mouth daily.    [name] dexamethasone (DECADRON) 4 MG tablet Take 8 mg by mouth 2 (two) times daily. Take 2 tablets (8 mg total) by mouth 2 (two) times daily. Start the day before Taxotere. Then again the day after chemo for 3 days.    . Halobetasol Oint &Lactic Ac Cr (ULTRAVATE X,  OINTMENT,) 0.05 & 10 % KIT Apply 1 application topically daily as needed (eczema). Reported on 10/08/2015    . ibuprofen (ADVIL,MOTRIN) 200 MG tablet Take 400 mg by mouth daily as needed for headache. Reported on 10/08/2015    . IODINE, KELP, PO Take by mouth 2 (two) times daily.    [name] lidocaine-prilocaine (EMLA) cream Apply 1 application topically as needed.    [name] LORazepam (ATIVAN) 0.5 MG tablet Take 0.5 mg by mouth at bedtime as needed (Nausea/Vomiting). Take 1 tablet (0.5 mg total) by mouth at bedtime as needed (Nausea or vomiting).    [name] omeprazole (PRILOSEC) 40 MG capsule Take 1 capsule (40 mg total) by mouth daily. 30 capsule 1  . ondansetron (ZOFRAN) 8 MG tablet Take 8 mg by mouth 2 (two) times daily as needed for nausea or vomiting. Take 1 tablet (8 mg total) by mouth 2 (two) times daily as needed for refractory nausea / vomiting. Start on  day 3 after chemo.    . prochlorperazine (COMPAZINE) 10 MG tablet Take 10 mg by mouth every 6 (six) hours as needed for nausea or vomiting.    [name] UNABLE TO FIND Pt on multiple supplements - theraxym, turmero, burdock complex,  Herbal tonic ( calendula, poke root, echinacia, atstragulus ), withania complex, Vitanox, Herbavital, AC Glutahthione, Glutihione recycler  Above prescribed by [facility]  Dr Salomon Fick Ward     No current facility-administered medications for this visit.    OBJECTIVE: Middle-aged African-American woman Who appears stated age  50 Vitals:   11/08/15 0817  BP: 118/79  Pulse: 85  Temp: 97.7 F (36.5 C)  Resp: 18     Body mass index is 29.45 kg/(m^2).    ECOG FS:1 - Symptomatic but completely ambulatory  Sclerae unicteric, pupils round and equal Oropharynx clear and moist-- no thrush or other lesions No cervical or supraclavicular adenopathy Lungs no rales or rhonchi Heart regular rate and rhythm Abd soft, nontender, positive bowel sounds MSK no focal spinal tenderness, no upper extremity lymphedema Neuro:  nonfocal, well oriented, appropriate affect Breasts: Deferred     LAB RESULTS:  CMP     Component Value Date/Time   NA 137 11/08/2015 0747   NA 141 11/13/2013 2008   K 4.4 11/08/2015 0747   K 4.0 11/13/2013 2008   CL 103 11/13/2013 2008   CO2 22 11/08/2015 0747   CO2 25 11/13/2013 2008   GLUCOSE 109 11/08/2015 0747   GLUCOSE 91 11/13/2013 2008   BUN 15.8 11/08/2015 0747   BUN 13 11/13/2013 2008   CREATININE 0.9 11/08/2015 0747   CREATININE 1.05 11/13/2013 2008   CALCIUM 9.7 11/08/2015 0747   CALCIUM 9.0 11/13/2013 2008   PROT 7.5 11/08/2015 0747   ALBUMIN 3.8 11/08/2015 0747   AST 70* 11/08/2015 0747   ALT 160* 11/08/2015 0747   ALKPHOS 124 11/08/2015 0747   BILITOT 0.57 11/08/2015 0747   GFRNONAA 61* 11/13/2013 2008   GFRAA 71* 11/13/2013 2008    INo results found for: SPEP, UPEP  Lab Results  Component Value Date   WBC 11.7* 11/08/2015   NEUTROABS 9.9* 11/08/2015   HGB 12.3 11/08/2015   HCT 37.7 11/08/2015   MCV 84.4 11/08/2015   PLT 365 11/08/2015      Chemistry      Component Value Date/Time   NA 137 11/08/2015 0747   NA 141 11/13/2013 2008   K 4.4 11/08/2015 0747   K 4.0 11/13/2013 2008   CL 103 11/13/2013 2008   CO2 22 11/08/2015 0747   CO2 25 11/13/2013 2008   BUN 15.8 11/08/2015 0747   BUN 13 11/13/2013 2008   CREATININE 0.9 11/08/2015 0747   CREATININE 1.05 11/13/2013 2008      Component Value Date/Time   CALCIUM 9.7 11/08/2015 0747   CALCIUM 9.0 11/13/2013 2008   ALKPHOS 124 11/08/2015 0747   AST 70* 11/08/2015 0747   ALT 160* 11/08/2015 0747   BILITOT 0.57 11/08/2015 0747       No results found for: LABCA2  No components found for: GEXBM841  No results for input(s): INR in the last 168 hours.  Urinalysis    Component Value Date/Time   COLORURINE YELLOW 11/13/2013 2030   APPEARANCEUR CLOUDY* 11/13/2013 2030   LABSPEC 1.015 10/17/2015 1102   LABSPEC 1.013 11/13/2013 2030   PHURINE 6.5 10/17/2015 1102   PHURINE 5.5  11/13/2013 2030   GLUCOSEU Negative 10/17/2015 1102   GLUCOSEU NEGATIVE  11/13/2013 2030   HGBUR Trace 10/17/2015 [place] 11/13/2013 2030   BILIRUBINUR Negative 10/17/2015 [place] 11/13/2013 2030   KETONESUR Negative 10/17/2015 1102   KETONESUR 15* 11/13/2013 2030   PROTEINUR 30 10/17/2015 [place] 11/13/2013 2030   UROBILINOGEN 0.2 10/17/2015 1102   UROBILINOGEN 0.2 11/13/2013 2030   NITRITE Negative 10/17/2015 1102   NITRITE NEGATIVE 11/13/2013 2030   LEUKOCYTESUR Negative 10/17/2015 1102   LEUKOCYTESUR TRACE* 11/13/2013 2030      ELIGIBLE FOR AVAILABLE RESEARCH PROTOCOL: PALLAS  STUDIES: No results found.  ASSESSMENT: 50 y.o. [place] woman status post left breast upper outer quadrant biopsy 08/03/2015 for a clinical T2 N0, stage IIa invasive ductal carcinoma, grade 3, estrogen and progesterone receptor positive, HER-2 not amplified, with an MIB-1 of 70%  (1) status post left lumpectomy and sentinel lymph node sampling 08/28/2015 for a pT2 pN0, stage IIA invasive ductal carcinoma, grade 3, with close but negative margins. Repeat HER-2 was again negative  (a) oncotype DX Score of 22 predicts a risk of recurrence outside the breast within 10 years of 14% if the patient's only systemic therapy is tamoxifen for 5 years. 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  He had some irritation of the liver from the chemotherapy and I would be uncomfortable repeating that. We basically have 2 choices, switching to cyclophosphamide/doxorubicin or simply stopping the chemotherapy.  She understands she has a good prognosis overall and that the chemotherapy is only expected to improve that prognosis by a few percentage points, perhaps 3% points. We also discussed the possible toxicities, side effects and complications of doxorubicin and cyclophosphamide.  After much discussion she decided she would try the before meals chemotherapy. She will need an echo and that is being scheduled for early next week. She will be treated June 15 and will only received 3 doses, so she will be done 5 weeks from now, which is a big plus for her.  She had quite a bit of bony pain from the OnPro. This usually is not so bad after the first cycle. Also now she knows to take ibuprofen, or Aleve as needed for the pain. Once she started on the ibuprofen that got a lot better.  We will use the same antinausea regimen which worked well for her TC regimen.  As far as the genetics is concerned she already is planning to have her father come for further testing to help [place] better define the variant we are. She    Chauncey Cruel, MD   11/08/2015 9:21 AM

## 2015-11-13 ENCOUNTER — Telehealth: Payer: Self-pay | Admitting: *Deleted

## 2015-11-13 NOTE — Telephone Encounter (Signed)
  Oncology Nurse Navigator Documentation    Navigator Encounter Type: Telephone (Confirmed echo at [facility] for 8:45 arrival on 11/14/15) (11/13/15 1300) Telephone: Outgoing Call (11/13/15 1300)                 Interventions: Coordination of Care (11/13/15 1300)   Coordination of Care: Appts (11/13/15 1300)                  Time Spent with Patient: 15 (11/13/15 1300)

## 2015-11-14 ENCOUNTER — Other Ambulatory Visit: Payer: Self-pay | Admitting: Oncology

## 2015-11-14 ENCOUNTER — Ambulatory Visit (HOSPITAL_COMMUNITY)
Admission: RE | Admit: 2015-11-14 | Discharge: 2015-11-14 | Disposition: A | Discharge status: Home / Self Care | Payer: 59 | Source: Ambulatory Visit | Attending: Oncology | Admitting: Oncology

## 2015-11-14 ENCOUNTER — Other Ambulatory Visit: Payer: Self-pay

## 2015-11-14 DIAGNOSIS — C50412 Malignant neoplasm of upper-outer quadrant of left female breast: Secondary | ICD-10-CM

## 2015-11-14 DIAGNOSIS — Z09 Encounter for follow-up examination after completed treatment for conditions other than malignant neoplasm: Secondary | ICD-10-CM | POA: Diagnosis present

## 2015-11-14 DIAGNOSIS — I517 Cardiomegaly: Secondary | ICD-10-CM | POA: Insufficient documentation

## 2015-11-14 LAB — ECHOCARDIOGRAM COMPLETE
CHL CUP STROKE VOLUME: 43 mL
EERAT: 6.78
EWDT: 278 ms
FS: 27 % — AB (ref 28–44)
IV/PV OW: 1.07
LA ID, A-P, ES: 34 mm
LA diam end sys: 34 mm
LADIAMINDEX: 1.73 cm/m2
LAVOL: 37 mL
LAVOLA4C: 36.7 mL
LAVOLIN: 18.8 mL/m2
LV E/e' medial: 6.78
LV E/e'average: 6.78
LV SIMPSON'S DISK: 64
LV sys vol: 24 mL (ref 14–42)
LVDIAVOL: 67 mL (ref 46–106)
LVDIAVOLIN: 34 mL/m2
LVELAT: 10.3 cm/s
LVOT area: 2.27 cm2
LVOT diameter: 17 mm
LVSYSVOLIN: 12 mL/m2
MV Dec: 278
MV pk A vel: 51.9 m/s
MV pk E vel: 69.8 m/s
PW: 10.5 mm — AB (ref 0.6–1.1)
TAPSE: 23.4 mm
TDI e' lateral: 10.3
TDI e' medial: 7.72

## 2015-11-14 MED ORDER — DEXAMETHASONE 4 MG PO TABS: ORAL_TABLET | ORAL | Status: DC

## 2015-11-14 MED ORDER — PROCHLORPERAZINE MALEATE 10 MG PO TABS: 10.0000 mg | ORAL_TABLET | Freq: Four times a day (QID) | ORAL | Status: DC | PRN

## 2015-11-14 NOTE — Progress Notes (Signed)
  Echocardiogram 2D Echocardiogram has been performed.  Jennette Dubin 11/14/2015, 9:33 AM

## 2015-11-15 ENCOUNTER — Ambulatory Visit (HOSPITAL_BASED_OUTPATIENT_CLINIC_OR_DEPARTMENT_OTHER): Payer: 59

## 2015-11-15 ENCOUNTER — Ambulatory Visit (HOSPITAL_BASED_OUTPATIENT_CLINIC_OR_DEPARTMENT_OTHER): Payer: 59 | Admitting: Nurse Practitioner

## 2015-11-15 ENCOUNTER — Encounter: Payer: Self-pay | Admitting: Nurse Practitioner

## 2015-11-15 ENCOUNTER — Telehealth: Payer: Self-pay | Admitting: *Deleted

## 2015-11-15 ENCOUNTER — Other Ambulatory Visit (HOSPITAL_BASED_OUTPATIENT_CLINIC_OR_DEPARTMENT_OTHER): Payer: 59

## 2015-11-15 ENCOUNTER — Telehealth: Payer: Self-pay | Admitting: Nurse Practitioner

## 2015-11-15 VITALS — BP 107/71 | HR 82 | Temp 98.4°F | Resp 18 | Ht 65.5 in | Wt 180.9 lb

## 2015-11-15 DIAGNOSIS — R748 Abnormal levels of other serum enzymes: Secondary | ICD-10-CM | POA: Diagnosis not present

## 2015-11-15 DIAGNOSIS — Z170 Estrogen receptor positive status [ER+]: Secondary | ICD-10-CM | POA: Diagnosis not present

## 2015-11-15 DIAGNOSIS — C50412 Malignant neoplasm of upper-outer quadrant of left female breast: Secondary | ICD-10-CM

## 2015-11-15 DIAGNOSIS — Z5111 Encounter for antineoplastic chemotherapy: Secondary | ICD-10-CM

## 2015-11-15 LAB — CBC WITH DIFFERENTIAL/PLATELET
BASO%: 0.2 % (ref 0.0–2.0)
Basophils Absolute: 0 10*3/uL (ref 0.0–0.1)
EOS%: 0.5 % (ref 0.0–7.0)
Eosinophils Absolute: 0.1 10*3/uL (ref 0.0–0.5)
HCT: 40.5 % (ref 34.8–46.6)
HGB: 13.2 g/dL (ref 11.6–15.9)
LYMPH%: 16.7 % (ref 14.0–49.7)
MCH: 28.1 pg (ref 25.1–34.0)
MCHC: 32.6 g/dL (ref 31.5–36.0)
MCV: 86.2 fL (ref 79.5–101.0)
MONO#: 0.5 10*3/uL (ref 0.1–0.9)
MONO%: 4 % (ref 0.0–14.0)
NEUT%: 78.6 % — AB (ref 38.4–76.8)
NEUTROS ABS: 10.3 10*3/uL — AB (ref 1.5–6.5)
PLATELETS: 269 10*3/uL (ref 145–400)
RBC: 4.7 10*6/uL (ref 3.70–5.45)
RDW: 15.6 % — ABNORMAL HIGH (ref 11.2–14.5)
WBC: 13.1 10*3/uL — AB (ref 3.9–10.3)
lymph#: 2.2 10*3/uL (ref 0.9–3.3)

## 2015-11-15 LAB — COMPREHENSIVE METABOLIC PANEL
ALT: 179 U/L — AB (ref 0–55)
ANION GAP: 10 meq/L (ref 3–11)
AST: 51 U/L — ABNORMAL HIGH (ref 5–34)
Albumin: 3.7 g/dL (ref 3.5–5.0)
Alkaline Phosphatase: 121 U/L (ref 40–150)
BILIRUBIN TOTAL: 0.45 mg/dL (ref 0.20–1.20)
BUN: 12.2 mg/dL (ref 7.0–26.0)
CHLORIDE: 106 meq/L (ref 98–109)
CO2: 27 meq/L (ref 22–29)
CREATININE: 1 mg/dL (ref 0.6–1.1)
Calcium: 9.7 mg/dL (ref 8.4–10.4)
EGFR: 74 mL/min/{1.73_m2} — ABNORMAL LOW (ref >90)
GLUCOSE: 135 mg/dL (ref 70–140)
Potassium: 3.6 mEq/L (ref 3.5–5.1)
SODIUM: 143 meq/L (ref 136–145)
TOTAL PROTEIN: 7.5 g/dL (ref 6.4–8.3)

## 2015-11-15 MED ORDER — PALONOSETRON HCL INJECTION 0.25 MG/5ML
INTRAVENOUS | Status: AC
  Filled 2015-11-15: qty 5

## 2015-11-15 MED ORDER — PALONOSETRON HCL INJECTION 0.25 MG/5ML
0.2500 mg | Freq: Once | INTRAVENOUS | Status: AC
  Administered 2015-11-15: 0.25 mg via INTRAVENOUS

## 2015-11-15 MED ORDER — DOXORUBICIN HCL CHEMO IV INJECTION 2 MG/ML
60.0000 mg/m2 | Freq: Once | INTRAVENOUS | Status: AC
  Administered 2015-11-15: 116 mg via INTRAVENOUS
  Filled 2015-11-15: qty 58

## 2015-11-15 MED ORDER — CYCLOPHOSPHAMIDE CHEMO INJECTION 1 GM
600.0000 mg/m2 | Freq: Once | INTRAMUSCULAR | Status: AC
  Administered 2015-11-15: 1160 mg via INTRAVENOUS
  Filled 2015-11-15: qty 58

## 2015-11-15 MED ORDER — HEPARIN SOD (PORK) LOCK FLUSH 100 UNIT/ML IV SOLN
500.0000 [IU] | Freq: Once | INTRAVENOUS | Status: AC | PRN
  Administered 2015-11-15: 500 [IU]
  Filled 2015-11-15: qty 5

## 2015-11-15 MED ORDER — SODIUM CHLORIDE 0.9 % IV SOLN
Freq: Once | INTRAVENOUS | Status: AC
  Administered 2015-11-15: 11:00:00 via INTRAVENOUS
  Filled 2015-11-15: qty 5

## 2015-11-15 MED ORDER — PEGFILGRASTIM 6 MG/0.6ML ~~LOC~~ PSKT
6.0000 mg | PREFILLED_SYRINGE | Freq: Once | SUBCUTANEOUS | Status: AC
  Administered 2015-11-15: 6 mg via SUBCUTANEOUS
  Filled 2015-11-15: qty 0.6

## 2015-11-15 MED ORDER — SODIUM CHLORIDE 0.9% FLUSH
10.0000 mL | INTRAVENOUS | Status: DC | PRN
  Administered 2015-11-15: 10 mL
  Filled 2015-11-15: qty 10

## 2015-11-15 MED ORDER — SODIUM CHLORIDE 0.9 % IV SOLN
Freq: Once | INTRAVENOUS | Status: AC
  Administered 2015-11-15: 10:00:00 via INTRAVENOUS

## 2015-11-15 NOTE — Telephone Encounter (Signed)
appt made and avs printed °

## 2015-11-15 NOTE — Progress Notes (Signed)
Per Gentry Fitz, NP, okay to proceed with treatment today despite elevated LFTs.

## 2015-11-15 NOTE — Progress Notes (Signed)
[place]  [phone] [phone]   ID: [name] DOB: [birth year]  MR#: [id number]  DHR#:[id number]  Patient Care Team: No Pcp Per Patient as PCP - General (General Practice) Chauncey Cruel, MD as Consulting Physician (Oncology) [name], MD as Consulting Physician (General Surgery) [name], MD as Attending Physician (Radiation Oncology) PCP: No PCP Per Patient GYN: OTHER MD:  CHIEF COMPLAINT: Estrogen receptor positive breast cancer  CURRENT TREATMENT: Adjuvant chemotherapy  BREAST CANCER HISTORY: From the original intake note:  Becci had screening mammography at Dr. Caralee Ates office 07/24/2015 suggesting a left breast mass and the patient was referred to the [facility] where on 07/30/2015 she underwent left diagnostic mammography with tomosynthesis and left breast ultrasonography. The breast density was category C. In the upper outer quadrant of the left breast there was a 2.3 cm spiculated mass. There were no associated calcifications. This mass was palpable at the 1:00 position 5 cm from the nipple. Ultrasound confirmed an irregular hypoechoic mass measuring 1.8 cm. Ultrasonography of the left axilla was negative.  On 08/03/2015 the patient underwent biopsy of the left breast mass in question. 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  The patient then met with surgery and after appropriate discussion she proceeded to left lumpectomy and sentinel lymph node sampling 08/28/2015. The final pathology ([id number]) showed invasive ductal carcinoma, grade 3, measuring 2.1 cm. Margins were close but negative. All 3 sentinel lymph nodes were clear. Repeat HER-2 was again negative, with a  signals ratio of 0.9, and number per cell 2.20.  INTERVAL HISTORY: Miral returns today for follow up of her breast cancer, accompanied by her oldest daughter. Today is day 1, cycle 1 of cyclophosphamide and doxorubicin, given every 14 days with neulasta on day 2 for granulocyte support. We are switching treatment because of persistently elevated liver enzymes.  REVIEW OF SYSTEMS: Jamese has has several weeks off from treatment, so her original chemo side effects have worn off. She is nervous about more bone pain from the neulasta today. She has some headaches and is using NSAIDs for that. Her hair is growing back already. She denies fevers, chills ,nausea, vomiting, or changes in bowel or bladder habits. A detailed review of systems is otherwise stable.  PAST MEDICAL HISTORY: Past Medical History  Diagnosis Date  . Headache   . Cancer ([place])     left breast  . Endometriosis   . Migraines   . Chronic constipation   . Family history of breast cancer   . Family history of prostate cancer     PAST SURGICAL HISTORY: Past Surgical History  Procedure Laterality Date  . Cesarean section    . Tonsillectomy    . Laparoscopic salpingo oopherectomy Right   . Breast lumpectomy with radioactive seed and sentinel lymph node biopsy Left 08/28/2015    Procedure: LEFT BREAST LUMPECTOMY WITH RADIOACTIVE SEED AND SENTINEL LYMPH NODE BIOPSY;  Surgeon: [name], MD;  Location: [facility];  Service: General;  Laterality: Left;    FAMILY HISTORY Family History  Problem Relation Age of Onset  . Prostate cancer Father 34    Gleason = 7  . Prostate cancer Maternal Uncle     dx in his 31s  . Breast cancer Paternal Aunt  dx in her 63s  . Prostate cancer Paternal Uncle   . Alzheimer's disease Maternal Grandmother   . Lung cancer Maternal Grandfather   . Congestive Heart Failure Paternal Grandmother   . Prostate cancer Maternal Uncle     dx in his 107s  . Breast cancer  Paternal Aunt     dx in her 68s; maternal half paternal aunt  . Prostate cancer Paternal Uncle     dx in his 70s  . Breast cancer Cousin   . Breast cancer Cousin     father's maternal cousin's daughter; dx in her 67s-30s  The patient's parents are still living, in their early 61s as of April 2016. The patient is an only child. On her father's side there are 4 cases of breast cancer, 2 aunts diagnosed in their 30s and 65s, and 2 cousins diagnosed in her 12s and one at age 29. The patient's father had prostate cancer diagnosed age 96.  GYNECOLOGIC HISTORY:  No LMP recorded. Menarche age 45, first live birth age 56. The patient is GX P2, although both children were born early. She is status post right salpingo-oophorectomy due to endometriosis. 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 She is divorced. At home is just she and her daughter [name], 66. Her other daughter, [name], is studying criminal justice at [employer]. Her SO is this is with. [name]    ADVANCED DIRECTIVES: Not in place   HEALTH MAINTENANCE: Social History  Substance Use Topics  . Smoking status: Never Smoker   . Smokeless tobacco: Not on file  . Alcohol Use: Yes     Comment: social     Colonoscopy:  PAP:  Bone density:  Lipid panel:  Allergies  Allergen Reactions  . Tetracyclines & Related Itching  . Other Rash    Powder in gloves causes rash    Current Outpatient Prescriptions  Medication Sig Dispense Refill  . Ascorbic Acid (VITAMIN C PO) Take 1 tablet by mouth daily.    [name] dexamethasone (DECADRON) 4 MG tablet Take 2 tablets by mouth once a day on the day after chemotherapy and then take 2 tablets two times a day for 2 days. Take with food. 30 tablet 1  . Halobetasol Oint &Lactic Ac Cr (ULTRAVATE X, OINTMENT,) 0.05 & 10 % KIT Apply 1 application topically daily as needed (eczema).  Reported on 10/08/2015    . ibuprofen (ADVIL,MOTRIN) 200 MG tablet Take 400 mg by mouth daily as needed for headache. Reported on 10/08/2015    . IODINE, KELP, PO Take by mouth 2 (two) times daily.    [name] lidocaine-prilocaine (EMLA) cream Apply 1 application topically as needed.    [name] LORazepam (ATIVAN) 0.5 MG tablet Take 0.5 mg by mouth at bedtime as needed (Nausea/Vomiting). Take 1 tablet (0.5 mg total) by mouth at bedtime as needed (Nausea or vomiting).    [name] omeprazole (PRILOSEC) 40 MG capsule Take 1 capsule (40 mg total) by mouth daily. 30 capsule 1  . ondansetron (ZOFRAN) 8 MG tablet Take 8 mg by mouth 2 (two) times daily as needed for nausea or vomiting. Take 1 tablet (8 mg total) by mouth 2 (two) times daily as needed for refractory nausea / vomiting. Start on day 3 after chemo.    . prochlorperazine (COMPAZINE) 10 MG tablet Take 10 mg by mouth every 6 (six) hours as needed for nausea  or vomiting.    . prochlorperazine (COMPAZINE) 10 MG tablet Take 1 tablet (10 mg total) by mouth every 6 (six) hours as needed (Nausea or vomiting). 30 tablet 1  . UNABLE TO FIND Pt on multiple supplements - theraxym, turmero, burdock complex,  Herbal tonic ( calendula, poke root, echinacia, atstragulus ), withania complex, Vitanox, Herbavital, AC Glutahthione, Glutihione recycler  Above prescribed by [facility]  Dr Darcy Ward     No current facility-administered medications for this visit.    OBJECTIVE: Middle-aged African-American woman Who appears stated age  Filed Vitals:   11/15/15 0858  BP: 107/71  Pulse: 82  Temp: 98.4 F (36.9 C)  Resp: 18     Body mass index is 29.63 kg/(m^2).    ECOG FS:1 - Symptomatic but completely ambulatory  Skin: warm, dry  HEENT: sclerae anicteric, conjunctivae pink, oropharynx clear. No thrush or mucositis.  Lymph Nodes: No cervical or supraclavicular lymphadenopathy  Lungs: clear to auscultation bilaterally, no rales, wheezes, or rhonci  Heart:  regular rate and rhythm  Abdomen: round, soft, non tender, positive bowel sounds  Musculoskeletal: No focal spinal tenderness, no peripheral edema  Neuro: non focal, well oriented, positive affect  Breasts: deferred  LAB RESULTS:  CMP     Component Value Date/Time   NA 143 11/15/2015 0823   NA 141 11/13/2013 2008   K 3.6 11/15/2015 0823   K 4.0 11/13/2013 2008   CL 103 11/13/2013 2008   CO2 27 11/15/2015 0823   CO2 25 11/13/2013 2008   GLUCOSE 135 11/15/2015 0823   GLUCOSE 91 11/13/2013 2008   BUN 12.2 11/15/2015 0823   BUN 13 11/13/2013 2008   CREATININE 1.0 11/15/2015 0823   CREATININE 1.05 11/13/2013 2008   CALCIUM 9.7 11/15/2015 0823   CALCIUM 9.0 11/13/2013 2008   PROT 7.5 11/15/2015 0823   ALBUMIN 3.7 11/15/2015 0823   AST 51* 11/15/2015 0823   ALT 179* 11/15/2015 0823   ALKPHOS 121 11/15/2015 0823   BILITOT 0.45 11/15/2015 0823   GFRNONAA 61* 11/13/2013 2008   GFRAA 71* 11/13/2013 2008    INo results found for: SPEP, UPEP  Lab Results  Component Value Date   WBC 13.1* 11/15/2015   NEUTROABS 10.3* 11/15/2015   HGB 13.2 11/15/2015   HCT 40.5 11/15/2015   MCV 86.2 11/15/2015   PLT 269 11/15/2015      Chemistry      Component Value Date/Time   NA 143 11/15/2015 0823   NA 141 11/13/2013 2008   K 3.6 11/15/2015 0823   K 4.0 11/13/2013 2008   CL 103 11/13/2013 2008   CO2 27 11/15/2015 0823   CO2 25 11/13/2013 2008   BUN 12.2 11/15/2015 0823   BUN 13 11/13/2013 2008   CREATININE 1.0 11/15/2015 0823   CREATININE 1.05 11/13/2013 2008      Component Value Date/Time   CALCIUM 9.7 11/15/2015 0823   CALCIUM 9.0 11/13/2013 2008   ALKPHOS 121 11/15/2015 0823   AST 51* 11/15/2015 0823   ALT 179* 11/15/2015 0823   BILITOT 0.45 11/15/2015 0823       No results found for: LABCA2  No components found for: LABCA125  No results for input(s): INR in the last 168 hours.  Urinalysis    Component Value Date/Time   COLORURINE YELLOW 11/13/2013 2030    APPEARANCEUR CLOUDY* 11/13/2013 2030   LABSPEC 1.015 10/17/2015 1102   LABSPEC 1.013 11/13/2013 2030   PHURINE 6.5 10/17/2015 1102     PHURINE 5.5 11/13/2013 2030   GLUCOSEU Negative 10/17/2015 [place] 11/13/2013 2030   HGBUR Trace 10/17/2015 [place] NEGATIVE 11/13/2013 2030   BILIRUBINUR Negative 10/17/2015 [place] 11/13/2013 2030   KETONESUR Negative 10/17/2015 1102   KETONESUR 15* 11/13/2013 2030   PROTEINUR 30 10/17/2015 [place] 11/13/2013 2030   UROBILINOGEN 0.2 10/17/2015 1102   UROBILINOGEN 0.2 11/13/2013 2030   NITRITE Negative 10/17/2015 1102   NITRITE NEGATIVE 11/13/2013 2030   LEUKOCYTESUR Negative 10/17/2015 1102   LEUKOCYTESUR TRACE* 11/13/2013 2030      ELIGIBLE FOR AVAILABLE RESEARCH PROTOCOL: PALLAS  STUDIES: No results found.  ASSESSMENT: 50 y.o. [place] woman status post left breast upper outer quadrant biopsy 08/03/2015 for a clinical T2 N0, stage IIa invasive ductal carcinoma, grade 3, estrogen and progesterone receptor positive, HER-2 not amplified, with an MIB-1 of 70%  (1) status post left lumpectomy and sentinel lymph node sampling 08/28/2015 for a pT2 pN0, stage IIA invasive ductal carcinoma, grade 3, with close but negative margins. Repeat HER-2 was again negative  (a) oncotype DX Score of 22 predicts a risk of recurrence outside the breast within 10 years of 14% if the patient's only systemic therapy is tamoxifen for 5 years. 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 We briefly reviewed her antiemetic schedule. The labs were reviewed in detail and while her AST and ALT are still elevated, they are trending downward. She will proceed with cycle 1 of cyclophosphamide and doxorubicin as planned.   Briannia will return in 1 week for labs and a nadir visit. She understands and agrees with this plan. She knows the goal of treatment in her case is cure. She has been encouraged to call with any issues that might arise before her next visit here.  Laurie Panda, NP   11/15/2015 9:12 AM

## 2015-11-15 NOTE — Patient Instructions (Signed)
[facility] Discharge Instructions for Patients Receiving Chemotherapy  Today you received the following chemotherapy agents:Adriamycin and Cytoxan   To help prevent nausea and vomiting after your treatment, we encourage you to take your nausea medication as directed.    If you develop nausea and vomiting that is not controlled by your nausea medication, call the clinic.   BELOW ARE SYMPTOMS THAT SHOULD BE REPORTED IMMEDIATELY:  *FEVER GREATER THAN 100.5 F  *CHILLS WITH OR WITHOUT FEVER  NAUSEA AND VOMITING THAT IS NOT CONTROLLED WITH YOUR NAUSEA MEDICATION  *UNUSUAL SHORTNESS OF BREATH  *UNUSUAL BRUISING OR BLEEDING  TENDERNESS IN MOUTH AND THROAT WITH OR WITHOUT PRESENCE OF ULCERS  *URINARY PROBLEMS  *BOWEL PROBLEMS  UNUSUAL RASH Items with * indicate a potential emergency and should be followed up as soon as possible.  Feel free to call the clinic you have any questions or concerns. The clinic phone number is [phone].  Please show the CHEMO ALERT CARD at check-in to the Emergency Department and triage nurse.   

## 2015-11-15 NOTE — Telephone Encounter (Signed)
This RN contacted the [facility] on [address] per probable placement of port by Dr [name] at their facility. Of note this facility does not subscribe to EPIC.  Per Candy in radiology report of tip placement with be faxed to this RN.

## 2015-11-15 NOTE — Telephone Encounter (Signed)
See other entry 

## 2015-11-16 ENCOUNTER — Ambulatory Visit: Payer: 59

## 2015-11-22 ENCOUNTER — Other Ambulatory Visit (HOSPITAL_BASED_OUTPATIENT_CLINIC_OR_DEPARTMENT_OTHER): Payer: 59

## 2015-11-22 ENCOUNTER — Encounter: Payer: 59 | Admitting: Genetic Counselor

## 2015-11-22 ENCOUNTER — Ambulatory Visit: Payer: 59

## 2015-11-22 ENCOUNTER — Other Ambulatory Visit: Payer: 59

## 2015-11-22 ENCOUNTER — Ambulatory Visit (HOSPITAL_BASED_OUTPATIENT_CLINIC_OR_DEPARTMENT_OTHER): Payer: 59 | Admitting: Oncology

## 2015-11-22 ENCOUNTER — Ambulatory Visit: Payer: 59 | Admitting: Oncology

## 2015-11-22 ENCOUNTER — Ambulatory Visit: Payer: 59 | Admitting: Genetic Counselor

## 2015-11-22 VITALS — BP 117/73 | HR 84 | Temp 98.4°F | Resp 18 | Ht 65.5 in | Wt 180.5 lb

## 2015-11-22 DIAGNOSIS — R110 Nausea: Secondary | ICD-10-CM | POA: Diagnosis not present

## 2015-11-22 DIAGNOSIS — Z803 Family history of malignant neoplasm of breast: Secondary | ICD-10-CM

## 2015-11-22 DIAGNOSIS — Z170 Estrogen receptor positive status [ER+]: Secondary | ICD-10-CM

## 2015-11-22 DIAGNOSIS — Z8042 Family history of malignant neoplasm of prostate: Secondary | ICD-10-CM

## 2015-11-22 DIAGNOSIS — C50412 Malignant neoplasm of upper-outer quadrant of left female breast: Secondary | ICD-10-CM

## 2015-11-22 DIAGNOSIS — Z1379 Encounter for other screening for genetic and chromosomal anomalies: Secondary | ICD-10-CM

## 2015-11-22 LAB — COMPREHENSIVE METABOLIC PANEL
ALBUMIN: 3.3 g/dL — AB (ref 3.5–5.0)
ALK PHOS: 122 U/L (ref 40–150)
ALT: 79 U/L — ABNORMAL HIGH (ref 0–55)
AST: 25 U/L (ref 5–34)
Anion Gap: 6 mEq/L (ref 3–11)
BUN: 18 mg/dL (ref 7.0–26.0)
CO2: 29 meq/L (ref 22–29)
Calcium: 8.8 mg/dL (ref 8.4–10.4)
Chloride: 101 mEq/L (ref 98–109)
Creatinine: 0.9 mg/dL (ref 0.6–1.1)
EGFR: 82 mL/min/{1.73_m2} — ABNORMAL LOW (ref >90)
GLUCOSE: 123 mg/dL (ref 70–140)
POTASSIUM: 4.1 meq/L (ref 3.5–5.1)
SODIUM: 137 meq/L (ref 136–145)
TOTAL PROTEIN: 6.6 g/dL (ref 6.4–8.3)
Total Bilirubin: <0.3 mg/dL (ref 0.20–1.20)

## 2015-11-22 LAB — CBC WITH DIFFERENTIAL/PLATELET
BASO%: 0.4 % (ref 0.0–2.0)
Basophils Absolute: 0 10*3/uL (ref 0.0–0.1)
EOS%: 3.5 % (ref 0.0–7.0)
Eosinophils Absolute: 0.1 10*3/uL (ref 0.0–0.5)
HCT: 37.2 % (ref 34.8–46.6)
HEMOGLOBIN: 12.3 g/dL (ref 11.6–15.9)
LYMPH#: 1.2 10*3/uL (ref 0.9–3.3)
LYMPH%: 47.1 % (ref 14.0–49.7)
MCH: 28 pg (ref 25.1–34.0)
MCHC: 33.1 g/dL (ref 31.5–36.0)
MCV: 84.5 fL (ref 79.5–101.0)
MONO#: 0.1 10*3/uL (ref 0.1–0.9)
MONO%: 5.5 % (ref 0.0–14.0)
NEUT#: 1.1 10*3/uL — ABNORMAL LOW (ref 1.5–6.5)
NEUT%: 43.5 % (ref 38.4–76.8)
NRBC: 0 % (ref 0–0)
Platelets: 119 10*3/uL — ABNORMAL LOW (ref 145–400)
RBC: 4.4 10*6/uL (ref 3.70–5.45)
RDW: 15 % — AB (ref 11.2–14.5)
WBC: 2.6 10*3/uL — ABNORMAL LOW (ref 3.9–10.3)

## 2015-11-22 NOTE — Progress Notes (Signed)
[name]  [phone] [phone]   ID: Ashley Tyler DOB: December 09, 1965  MR#: [id number]  KDX#:[id number]  Patient Care Team: No Pcp Per Patient as PCP - General (General Practice) Ashley Cruel, MD as Consulting Physician (Oncology) Ashley Seltzer, MD as Consulting Physician (General Surgery) Ashley Gibson, MD as Attending Physician (Radiation Oncology) PCP: No PCP Per Patient GYN: OTHER MD:  CHIEF COMPLAINT: Estrogen receptor positive breast cancer  CURRENT TREATMENT: Adjuvant chemotherapy  BREAST CANCER HISTORY: From the original intake note:  Ashley Tyler had screening mammography at Dr. Caralee Ates office 07/24/2015 suggesting a left breast mass and the patient was referred to the [facility] where on 07/30/2015 she underwent left diagnostic mammography with tomosynthesis and left breast ultrasonography. The breast density was category C. In the upper outer quadrant of the left breast there was a 2.3 cm spiculated mass. There were no associated calcifications. This mass was palpable at the 1:00 position 5 cm from the nipple. Ultrasound confirmed an irregular hypoechoic mass measuring 1.8 cm. Ultrasonography of the left axilla was negative.  On 08/03/2015 the patient underwent biopsy of the left breast mass in question. 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  The patient then met with surgery and after appropriate discussion she proceeded to left lumpectomy and sentinel lymph node sampling 08/28/2015. The final pathology ([id number]) showed invasive ductal carcinoma, grade 3, measuring 2.1 cm. Margins were close but negative. All 3 sentinel lymph nodes were clear. Repeat HER-2 was again negative, with a  signals ratio of 0.9, and number per cell 2.20.  INTERVAL HISTORY: Ashley Tyler returns today for follow up of her breast cancer, accompanied by her father and daughter.  Today is day 8, cycle 1 of three planned cycles of cyclophosphamide and doxorubicin, given every 14 days with onpro support. This is her second cycle of chemotherapy, as she earlier received a dose of cyclophosphamide/docetaxel;we changed to cyclophosphamide/doxorubicin because of liver function test abnormalities.  REVIEW OF SYSTEMS: Ginna tolerated her first cycle of AC relatively well. This time she had no bone pain at all. She's had no mouth sores. She does feel a bit fatigued.She is sleeping fair". She's having hot flashes. She feels deconditioned and short of breath.More than anything as she is nauseated. She doesn't feel any of the medications she is on are helping the nausea particularly. She had been prescribed some omeprazole and she just started that today. Basically her stomach always feels queasy unless she puts some food and it. She has occasional headaches. Aside from these problems a detailed review of systems today was noncontributory  PAST MEDICAL HISTORY: Past Medical History  Diagnosis Date  . Headache   . Cancer ([place])     left breast  . Endometriosis   . Migraines   . Chronic constipation   . Family history of breast cancer   . Family history of prostate cancer     PAST SURGICAL HISTORY: Past Surgical History  Procedure Laterality Date  . Cesarean section    . Tonsillectomy    . Laparoscopic salpingo oopherectomy Right   . Breast lumpectomy with radioactive seed and sentinel lymph node biopsy Left 08/28/2015    Procedure: LEFT BREAST LUMPECTOMY WITH RADIOACTIVE SEED AND SENTINEL LYMPH NODE BIOPSY;  Surgeon: Ashley Seltzer, MD;  Location: [facility];  Service: General;  Laterality: Left;    FAMILY HISTORY Family History  Problem Relation Age of Onset  . Prostate cancer Father 36     Gleason = 7  . Prostate cancer Maternal Uncle     dx in his 25s  . Breast cancer Paternal Aunt     dx in her 68s  . Prostate cancer Paternal Uncle   . Alzheimer's disease Maternal Grandmother   . Lung cancer Maternal Grandfather   . Congestive Heart Failure Paternal Grandmother   . Prostate cancer Maternal Uncle     dx in his 16s  . Breast cancer Paternal Aunt     dx in her 65s; maternal half paternal aunt  . Prostate cancer Paternal Uncle     dx in his 26s  . Breast cancer Cousin   . Breast cancer Cousin     father's maternal cousin's daughter; dx in her 60s-30s  The patient's parents are still living, in their early 18s as of April 2016. The patient is an only child. On her father's side there are 4 cases of breast cancer, 2 aunts diagnosed in their 26s and 21s, and 2 cousins diagnosed in her 70s and one at age 62. The patient's father had prostate cancer diagnosed age 29.  GYNECOLOGIC HISTORY:  No LMP recorded. Menarche age 23, first live birth age 75. The patient is GX P2, although both children were born early. She is status post right salpingo-oophorectomy due to endometriosis. 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 She is divorced. At home is just she and her daughter Ashley Tyler, 42. Her other daughter, Ashley Tyler, is studying criminal justice at [employer]. Her SO is this is with. Ashley Tyler    ADVANCED DIRECTIVES: Not in place   HEALTH MAINTENANCE: Social History  Substance Use Topics  . Smoking status: Never Smoker   . Smokeless tobacco: Not on file  . Alcohol Use: Yes     Comment: social     Colonoscopy:  PAP:  Bone density:  Lipid panel:  Allergies  Allergen Reactions  . Tetracyclines & Related Itching  . Other Rash    Powder in gloves causes rash    Current Outpatient Prescriptions  Medication Sig Dispense Refill  . dexamethasone  (DECADRON) 4 MG tablet Take 2 tablets by mouth once a day on the day after chemotherapy and then take 2 tablets two times a day for 2 days. Take with food. 30 tablet 1  . ibuprofen (ADVIL,MOTRIN) 200 MG tablet Take 400 mg by mouth daily as needed for headache. Reported on 10/08/2015    . lidocaine-prilocaine (EMLA) cream Apply 1 application topically as needed.    [name] LORazepam (ATIVAN) 0.5 MG tablet Take 0.5 mg by mouth at bedtime as needed (Nausea/Vomiting). Take 1 tablet (0.5 mg total) by mouth at bedtime as needed (Nausea or vomiting).    [name] omeprazole (PRILOSEC) 40 MG capsule Take 1 capsule (40 mg total) by mouth daily. 30 capsule 1  . ondansetron (ZOFRAN) 8 MG tablet Take 8 mg by mouth 2 (two) times daily as needed for nausea or vomiting. Take 1 tablet (8 mg total) by mouth 2 (two) times daily as needed for refractory nausea / vomiting. Start on day 3 after chemo.    . prochlorperazine (COMPAZINE) 10 MG tablet Take 10 mg by mouth every 6 (six) hours as needed for  nausea or vomiting.     No current facility-administered medications for this visit.    OBJECTIVE: Middle-aged African-American woman in no acute distress  Filed Vitals:   11/22/15 1447  BP: 117/73  Pulse: 84  Temp: 98.4 F (36.9 C)  Resp: 18     Body mass index is 29.57 kg/(m^2).    ECOG FS:1 - Symptomatic but completely ambulatory  Sclerae unicteric, pupils round and equal Oropharynx clear and moist-- no thrush or other lesions No cervical or supraclavicular adenopathy Lungs no rales or rhonchi Heart regular rate and rhythm Abd soft, nontender, positive bowel sounds MSK no focal spinal tenderness, no upper extremity lymphedema Neuro: nonfocal, well oriented, appropriate affect Breasts: Deferred   LAB RESULTS:  CMP     Component Value Date/Time   NA 137 11/22/2015 1417   NA 141 11/13/2013 2008   K 4.1 11/22/2015 1417   K 4.0 11/13/2013 2008   CL 103 11/13/2013 2008   CO2 29 11/22/2015 1417   CO2 25 11/13/2013  2008   GLUCOSE 123 11/22/2015 1417   GLUCOSE 91 11/13/2013 2008   BUN 18.0 11/22/2015 1417   BUN 13 11/13/2013 2008   CREATININE 0.9 11/22/2015 1417   CREATININE 1.05 11/13/2013 2008   CALCIUM 8.8 11/22/2015 1417   CALCIUM 9.0 11/13/2013 2008   PROT 6.6 11/22/2015 1417   ALBUMIN 3.3* 11/22/2015 1417   AST 25 11/22/2015 1417   ALT 79* 11/22/2015 1417   ALKPHOS 122 11/22/2015 1417   BILITOT <0.30 11/22/2015 1417   GFRNONAA 61* 11/13/2013 2008   GFRAA 71* 11/13/2013 2008    INo results found for: SPEP, UPEP  Lab Results  Component Value Date   WBC 2.6* 11/22/2015   NEUTROABS 1.1* 11/22/2015   HGB 12.3 11/22/2015   HCT 37.2 11/22/2015   MCV 84.5 11/22/2015   PLT 119* 11/22/2015      Chemistry      Component Value Date/Time   NA 137 11/22/2015 1417   NA 141 11/13/2013 2008   K 4.1 11/22/2015 1417   K 4.0 11/13/2013 2008   CL 103 11/13/2013 2008   CO2 29 11/22/2015 1417   CO2 25 11/13/2013 2008   BUN 18.0 11/22/2015 1417   BUN 13 11/13/2013 2008   CREATININE 0.9 11/22/2015 1417   CREATININE 1.05 11/13/2013 2008      Component Value Date/Time   CALCIUM 8.8 11/22/2015 1417   CALCIUM 9.0 11/13/2013 2008   ALKPHOS 122 11/22/2015 1417   AST 25 11/22/2015 1417   ALT 79* 11/22/2015 1417   BILITOT <0.30 11/22/2015 1417       No results found for: LABCA2  No components found for: [id number]  No results for input(s): INR in the last 168 hours.  Urinalysis    Component Value Date/Time   COLORURINE YELLOW 11/13/2013 2030   APPEARANCEUR CLOUDY* 11/13/2013 2030   LABSPEC 1.015 10/17/2015 1102   LABSPEC 1.013 11/13/2013 2030   PHURINE 6.5 10/17/2015 1102   PHURINE 5.5 11/13/2013 2030   GLUCOSEU Negative 10/17/2015 1102   GLUCOSEU NEGATIVE 11/13/2013 2030   HGBUR Trace 10/17/2015 Ashley Tyler NEGATIVE 11/13/2013 2030   BILIRUBINUR Negative 10/17/2015 [place] 11/13/2013 2030   KETONESUR Negative 10/17/2015 1102   KETONESUR 15* 11/13/2013 2030     PROTEINUR 30 10/17/2015 1102   PROTEINUR NEGATIVE 11/13/2013 2030   UROBILINOGEN 0.2 10/17/2015 1102   UROBILINOGEN 0.2 11/13/2013 2030   NITRITE Negative 10/17/2015 1102   NITRITE NEGATIVE 11/13/2013  2030   LEUKOCYTESUR Negative 10/17/2015 1102   LEUKOCYTESUR TRACE* 11/13/2013 2030      ELIGIBLE FOR AVAILABLE RESEARCH PROTOCOL: PALLAS  STUDIES: ------------------------------------------------------------------- Transthoracic Echocardiography  Patient: Ashley Tyler, Ashley Tyler MR #: [id number] Study Date: 11/14/2015 Gender: F Age: 10 Height: 166.4 cm Weight: 81.6 kg BSA: 1.97 m^2 Pt. Status: Room:  ATTENDING Athira Janowicz, Valli Glance Laressa Bolinger, [place], Outpatient SONOGRAPHER Mikki Santee  cc:  ------------------------------------------------------------------- LV EF: 60% - 65%  ASSESSMENT: 50 y.o. [place] woman status post left breast upper outer quadrant biopsy 08/03/2015 for a clinical T2 N0, stage IIa invasive ductal carcinoma, grade 3, estrogen and progesterone receptor positive, HER-2 not amplified, with an MIB-1 of 70%  (1) status post left lumpectomy and sentinel lymph node sampling 08/28/2015 for a pT2 pN0, stage IIA invasive ductal carcinoma, grade 3, with close but negative margins. Repeat HER-2 was again negative  (a) oncotype DX Score of 22 predicts a risk of recurrence outside the breast within 10 years of 14% if the patient's only systemic therapy is tamoxifen for 5 years. 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 It is encouraging that her bone pain resolved. The liver function tests also are normalizing.  The big problem is nausea. She was taking the nausea medicine fairly as prescribed but we reviewed that in detail today. Specifically she will take dexamethasone days to 3 and 4 after each treatment. She was reminded she does not need to take it on the day before treatment. She will take Compazine before meals and at bedtimebeginning on the second day from therapy. She may take half a tablet so she doesn't get side effects from the medication. Beginning on day 3, in the evening, she can start ondansetron and she can take that twice a day for the next 3 days as needed. Finally she will take omeprazole daily.  I think if we make these changes she will have less"queasiness". We discussed diet issues andI also encouraged her to start an exercise program if she does not want her gait is significant amount of weight before she finishes chemotherapy  Her counts are good. She is going to see me again in a week and she will have her third cycle of chemotherapy at that time.   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  Ashley Cruel, MD   11/22/2015 3:16 PM

## 2015-11-22 NOTE — Progress Notes (Signed)
REFERRING PROVIDER: Lurline Del, MD  PRIMARY PROVIDER:  No PCP Per Patient  PRIMARY REASON FOR VISIT:  1. Genetic testing   2. Breast cancer of upper-outer quadrant of left female breast ([place])   3. Family history of breast cancer   4. Family history of prostate cancer      HISTORY OF PRESENT ILLNESS:   Ashley Tyler, a 50 y.o. female, was seen for a [place] cancer genetics consultation at the request of Dr. 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  Ashley Tyler presents to clinic today to discuss the possibility of a hereditary predisposition to cancer, genetic testing, and to further clarify her future cancer risks, as well as potential cancer risks for family members. Ashley Tyler is seen with her father to undergo genetic testing on family members to help clarify the genetic change found in her.   CANCER HISTORY:   No history exists.     Past Medical History  Diagnosis Date  . Headache   . Cancer ([place])     left breast  . Endometriosis   . Migraines   . Chronic constipation   . Family history of breast cancer   . Family history of prostate cancer     Past Surgical History  Procedure Laterality Date  . Cesarean section    . Tonsillectomy    . Laparoscopic salpingo oopherectomy Right   . Breast lumpectomy with radioactive seed and sentinel lymph node biopsy Left 08/28/2015    Procedure: LEFT BREAST LUMPECTOMY WITH RADIOACTIVE SEED AND SENTINEL LYMPH NODE BIOPSY;  Surgeon: [name], MD;  Location: [facility];  Service: General;  Laterality: Left;    Social History   Social History  . Marital Status: Divorced    Spouse Name: N/A  . Number of Children: 2  . Years of Education: N/A   Social History Main Topics  . Smoking status: Never Smoker   . Smokeless tobacco: Not on file  . Alcohol Use: Yes     Comment: social  . Drug Use: No  . Sexual Activity: Yes    Birth Control/ Protection: Surgical   Other Topics Concern  . Not on file   Social History Narrative     FAMILY HISTORY:  We obtained a detailed, 4-generation family history.  Significant diagnoses are listed below: Family History  Problem Relation Age of Onset  . Prostate cancer Father 67    Gleason = 7  . Prostate cancer Maternal Uncle     dx in his 74s  . Breast cancer Paternal Aunt     dx in her 74s  . Prostate cancer Paternal Uncle   . Alzheimer's disease Maternal Grandmother   . Lung cancer Maternal Grandfather   . Congestive Heart Failure Paternal Grandmother   . Prostate cancer Maternal Uncle     dx in his 36s  . Breast cancer Paternal Aunt     dx in her 78s; maternal half paternal aunt  . Prostate cancer Paternal Uncle     dx in his 50s  . Breast cancer Cousin   . Breast cancer Cousin     father's maternal cousin's daughter; dx in her 71s-30s    The patient has two duaghters who are healthy and cancer free. She is an only child. Her parents are alive, her mother is 80 and her father is 24. He was diagnosed with prostate cancer at 62 with Gleason score of  7. Her mother had four brothers and one sister. Two brothers had prostate cancer. There is no other cancer history on this side of the family. Her father had nine siblings. One maternal half sister had breast cancer in her 51s and her daughter had breast cancer in her 21s. A full sister also had breast cancer in her 49s. Two brothers were diagnosed with prostate cancer. The patient's father had a maternal first cousin who's daughter had breast cancer in her 52s-30s. Patient's maternal ancestors are of [place] American and Native American descent, and paternal ancestors are of [place] American and Native American descent. There is no reported Ashkenazi Jewish ancestry. There is no known consanguinity.  GENETIC COUNSELING ASSESSMENT: Ashley Tyler is a 50 y.o. 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  Further testing is being performed to help determine if this PMS2 variant is pathogenic or benign. We, therefore, discussed and recommended the following at today's visit.   DISCUSSION:  We discussed that PMS2 is a gene associated with Lynch syndrome.  Lynch syndrome is typically a colorectal cancer syndrome, however, in some families we see a predominance of breast cancer.  Therefore, there is a chance that this variant could be related to the cancer in her family.  Ms. Detjen brought her father to undergo genetic testing to determine if he too has this variant and if so, can they determine the breakpoints more easily and learn if they are occurring in the pseudoregion of the PMS2 gene.  We discussed that if we determine that this genetic change is associated with Lynch syndrome, then we would need to follow her differently in regards to her colon cancer risk.    PLAN: Genetic testing will be performed on her father to see if we can better assess the breakpoints of her deletion. Results should be available within approximately 2-3 weeks' time, at which point they will be disclosed by telephone to Ashley Tyler, as will any additional recommendations warranted by these results. Ashley Tyler will receive a summary of her genetic counseling visit and a copy of her results once available. This information will also be available in Epic. We encouraged Ashley Tyler to remain in contact with cancer genetics annually so that we can continuously update the family history and inform her of any changes in cancer genetics and testing that may be of benefit for her family. Ashley Tyler questions were answered to her satisfaction today. Our contact information was provided should additional questions or concerns arise.  Lastly, we encouraged Ashley Tyler to remain in contact with cancer genetics annually so that we can  continuously update the family history and inform her of any changes in cancer genetics and testing that may be of benefit for this family.   Ms.  Tyler questions were answered to her satisfaction today. Our contact information was provided should additional questions or concerns arise. Thank you for the referral and allowing [place] to share in the care of your patient.   Navdeep Fessenden P. Florene Glen, [place], [facility] Certified Genetic Counselor [email or web address] phone: [phone]  The patient was seen for a total of 35 minutes in face-to-face genetic counseling.  This patient was discussed with Drs. Magrinat, Lindi Adie and/or Burr Medico who agrees with the above.    _______________________________________________________________________ For Office Staff:  Number of people involved in session: 3 Was an Intern/ student involved with case: no

## 2015-11-27 ENCOUNTER — Telehealth: Payer: Self-pay | Admitting: Genetic Counselor

## 2015-11-27 NOTE — Telephone Encounter (Signed)
Discussed with [name] that her father's OOP cost for genetic testing would be $0 following the lab's benefits investigation.  She okays proceeding with testing via the prostate cancer panel through [facility].

## 2015-11-29 ENCOUNTER — Ambulatory Visit (HOSPITAL_BASED_OUTPATIENT_CLINIC_OR_DEPARTMENT_OTHER): Payer: 59

## 2015-11-29 ENCOUNTER — Other Ambulatory Visit (HOSPITAL_BASED_OUTPATIENT_CLINIC_OR_DEPARTMENT_OTHER): Payer: 59

## 2015-11-29 ENCOUNTER — Ambulatory Visit (HOSPITAL_BASED_OUTPATIENT_CLINIC_OR_DEPARTMENT_OTHER): Payer: 59 | Admitting: Oncology

## 2015-11-29 ENCOUNTER — Telehealth: Payer: Self-pay | Admitting: Oncology

## 2015-11-29 VITALS — BP 112/76 | HR 77 | Temp 97.7°F | Resp 18 | Ht 65.5 in | Wt 179.2 lb

## 2015-11-29 DIAGNOSIS — C50412 Malignant neoplasm of upper-outer quadrant of left female breast: Secondary | ICD-10-CM

## 2015-11-29 DIAGNOSIS — Z5111 Encounter for antineoplastic chemotherapy: Secondary | ICD-10-CM

## 2015-11-29 DIAGNOSIS — R110 Nausea: Secondary | ICD-10-CM | POA: Diagnosis not present

## 2015-11-29 DIAGNOSIS — Z170 Estrogen receptor positive status [ER+]: Secondary | ICD-10-CM

## 2015-11-29 LAB — COMPREHENSIVE METABOLIC PANEL
ALK PHOS: 112 U/L (ref 40–150)
ALT: 77 U/L — AB (ref 0–55)
ANION GAP: 9 meq/L (ref 3–11)
AST: 24 U/L (ref 5–34)
Albumin: 3.7 g/dL (ref 3.5–5.0)
BUN: 15.1 mg/dL (ref 7.0–26.0)
CALCIUM: 9.6 mg/dL (ref 8.4–10.4)
CO2: 27 mEq/L (ref 22–29)
CREATININE: 1 mg/dL (ref 0.6–1.1)
Chloride: 105 mEq/L (ref 98–109)
EGFR: 74 mL/min/{1.73_m2} — ABNORMAL LOW (ref >90)
Glucose: 129 mg/dl (ref 70–140)
Potassium: 4 mEq/L (ref 3.5–5.1)
Sodium: 141 mEq/L (ref 136–145)
TOTAL PROTEIN: 7.3 g/dL (ref 6.4–8.3)

## 2015-11-29 LAB — CBC WITH DIFFERENTIAL/PLATELET
BASO%: 1 % (ref 0.0–2.0)
Basophils Absolute: 0 10*3/uL (ref 0.0–0.1)
EOS ABS: 0 10*3/uL (ref 0.0–0.5)
EOS%: 0.4 % (ref 0.0–7.0)
HEMATOCRIT: 40.5 % (ref 34.8–46.6)
HGB: 13.2 g/dL (ref 11.6–15.9)
LYMPH#: 1 10*3/uL (ref 0.9–3.3)
LYMPH%: 22.4 % (ref 14.0–49.7)
MCH: 27.7 pg (ref 25.1–34.0)
MCHC: 32.5 g/dL (ref 31.5–36.0)
MCV: 85.2 fL (ref 79.5–101.0)
MONO#: 0.3 10*3/uL (ref 0.1–0.9)
MONO%: 5.9 % (ref 0.0–14.0)
NEUT%: 70.3 % (ref 38.4–76.8)
NEUTROS ABS: 3 10*3/uL (ref 1.5–6.5)
PLATELETS: 259 10*3/uL (ref 145–400)
RBC: 4.75 10*6/uL (ref 3.70–5.45)
RDW: 15.9 % — ABNORMAL HIGH (ref 11.2–14.5)
WBC: 4.3 10*3/uL (ref 3.9–10.3)

## 2015-11-29 MED ORDER — SODIUM CHLORIDE 0.9% FLUSH
10.0000 mL | INTRAVENOUS | Status: DC | PRN
  Administered 2015-11-29: 10 mL
  Filled 2015-11-29: qty 10

## 2015-11-29 MED ORDER — PALONOSETRON HCL INJECTION 0.25 MG/5ML
0.2500 mg | Freq: Once | INTRAVENOUS | Status: AC
  Administered 2015-11-29: 0.25 mg via INTRAVENOUS

## 2015-11-29 MED ORDER — PALONOSETRON HCL INJECTION 0.25 MG/5ML
INTRAVENOUS | Status: AC
  Filled 2015-11-29: qty 5

## 2015-11-29 MED ORDER — DOXORUBICIN HCL CHEMO IV INJECTION 2 MG/ML
60.0000 mg/m2 | Freq: Once | INTRAVENOUS | Status: AC
  Administered 2015-11-29: 116 mg via INTRAVENOUS
  Filled 2015-11-29: qty 58

## 2015-11-29 MED ORDER — SODIUM CHLORIDE 0.9 % IV SOLN
Freq: Once | INTRAVENOUS | Status: AC
  Administered 2015-11-29: 12:00:00 via INTRAVENOUS
  Filled 2015-11-29: qty 5

## 2015-11-29 MED ORDER — SODIUM CHLORIDE 0.9 % IV SOLN
Freq: Once | INTRAVENOUS | Status: AC
  Administered 2015-11-29: 12:00:00 via INTRAVENOUS

## 2015-11-29 MED ORDER — SODIUM CHLORIDE 0.9 % IV SOLN
600.0000 mg/m2 | Freq: Once | INTRAVENOUS | Status: AC
  Administered 2015-11-29: 1160 mg via INTRAVENOUS
  Filled 2015-11-29: qty 58

## 2015-11-29 MED ORDER — PEGFILGRASTIM 6 MG/0.6ML ~~LOC~~ PSKT
6.0000 mg | PREFILLED_SYRINGE | Freq: Once | SUBCUTANEOUS | Status: AC
  Administered 2015-11-29: 6 mg via SUBCUTANEOUS
  Filled 2015-11-29: qty 0.6

## 2015-11-29 MED ORDER — HEPARIN SOD (PORK) LOCK FLUSH 100 UNIT/ML IV SOLN
500.0000 [IU] | Freq: Once | INTRAVENOUS | Status: AC | PRN
  Administered 2015-11-29: 500 [IU]
  Filled 2015-11-29: qty 5

## 2015-11-29 NOTE — Progress Notes (Signed)
[facility]  [phone] [phone]   ID: Ashley Tyler DOB: 1966/05/01  MR#: [id number]  WIO#:[id number]  Patient Care Team: No Pcp Per Patient as PCP - General (General Practice) Chauncey Cruel, MD as Consulting Physician (Oncology) [name], MD as Consulting Physician (General Surgery) [name], MD as Attending Physician (Radiation Oncology) PCP: No PCP Per Patient GYN: OTHER MD:  CHIEF COMPLAINT: Estrogen receptor positive breast cancer  CURRENT TREATMENT: Adjuvant chemotherapy  BREAST CANCER HISTORY: From the original intake note:  Ashley Tyler had screening mammography at Dr. Caralee Ates office 07/24/2015 suggesting a left breast mass and the [facility] on 07/30/2015 she underwent left diagnostic mammography with tomosynthesis and left breast ultrasonography. The breast density was category C. In the upper outer quadrant of the left breast there was a 2.3 cm spiculated mass. There were no associated calcifications. This mass was palpable at the 1:00 position 5 cm from the nipple. Ultrasound confirmed an irregular hypoechoic mass measuring 1.8 cm. Ultrasonography of the left axilla was negative.  On 08/03/2015 the patient underwent biopsy of the left breast mass in question. 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  The patient then met with surgery and after appropriate discussion she proceeded to left lumpectomy and sentinel lymph node sampling 08/28/2015. The final pathology ([id number]) showed invasive ductal carcinoma, grade 3, measuring 2.1 cm. Margins were close but negative. All 3 sentinel lymph nodes were clear. Repeat HER-2 was again negative, with a  signals ratio of 0.9, and number per cell 2.20.  INTERVAL HISTORY: Ashley Tyler returns today for follow up of her estrogen receptor positive breast cancer, accompanied by her daughter [name].  Today is day 1, cycle 2 of three planned cycles of cyclophosphamide and doxorubicin, given every 14 days with onpro support. This is her third cycle of chemotherapy, as she earlier received a dose of cyclophosphamide/docetaxel;we changed to cyclophosphamide/doxorubicin because of liver function test abnormalities.  REVIEW OF SYSTEMS: [name] is feeling a little bit nauseated this morning. It could be the ham biscuit she ate, which she ate pretty fast according to her daughter, or it could be that she is beginning to develop associated nausea. I think it may be the latter and that is a concern. She does complain of mild fatigue. She does have some nausea after each treatment but not nearly as bad as she did after the first cycle, with the Taxotere. Aside from these issues a detailed review of systems today was stable  PAST MEDICAL HISTORY: Past Medical History  Diagnosis Date  . Headache   . Cancer ([place])     left breast  . Endometriosis   . Migraines   . Chronic constipation   . Family history of breast cancer   . Family history of prostate cancer     PAST SURGICAL HISTORY: Past Surgical History  Procedure Laterality Date  . Cesarean section    . Tonsillectomy    . Laparoscopic salpingo oopherectomy Right   . Breast lumpectomy with radioactive seed and sentinel lymph node biopsy Left 08/28/2015    Procedure: LEFT BREAST LUMPECTOMY WITH RADIOACTIVE SEED AND SENTINEL LYMPH NODE BIOPSY;  Surgeon: [name], MD;  Location: [facility];  Service: General;  Laterality: Left;  FAMILY HISTORY Family History  Problem Relation Age of Onset  . Prostate cancer Father 34    Gleason = 7  . Prostate cancer Maternal Uncle     dx in his 81s  . Breast cancer Paternal Aunt     dx in her 42s   . Prostate cancer Paternal Uncle   . Alzheimer's disease Maternal Grandmother   . Lung cancer Maternal Grandfather   . Congestive Heart Failure Paternal Grandmother   . Prostate cancer Maternal Uncle     dx in his 31s  . Breast cancer Paternal Aunt     dx in her 12s; maternal half paternal aunt  . Prostate cancer Paternal Uncle     dx in his 44s  . Breast cancer Cousin   . Breast cancer Cousin     father's maternal cousin's daughter; dx in her 45s-30s  The patient's parents are still living, in their early 83s as of April 2016. The patient is an only child. On her father's side there are 4 cases of breast cancer, 2 aunts diagnosed in their 50s and 18s, and 2 cousins diagnosed in her 41s and one at age 24. The patient's father had prostate cancer diagnosed age 55.  GYNECOLOGIC HISTORY:  No LMP recorded. Menarche age 65, first live birth age 89. The patient is GX P2, although both children were born early. She is status post right salpingo-oophorectomy due to endometriosis. She is still having regular periods  SOCIAL HISTORY:  Alynah works as a Engineer, civil (consulting) in the prior Therapist, music for [employer]. She is divorced. At home is just she and her daughter [name], 3. Her other daughter, [name], is studying criminal justice at [employer]. Her SO is this is with. [name]    ADVANCED DIRECTIVES: Not in place   HEALTH MAINTENANCE: Social History  Substance Use Topics  . Smoking status: Never Smoker   . Smokeless tobacco: Not on file  . Alcohol Use: Yes     Comment: social     Colonoscopy:  PAP:  Bone density:  Lipid panel:  Allergies  Allergen Reactions  . Tetracyclines & Related Itching  . Other Rash    Powder in gloves causes rash    Current Outpatient Prescriptions  Medication Sig Dispense Refill  . dexamethasone (DECADRON) 4 MG tablet Take 2 tablets by mouth once a day on the day after chemotherapy and then take 2 tablets two times a  day for 2 days. Take with food. 30 tablet 1  . ibuprofen (ADVIL,MOTRIN) 200 MG tablet Take 400 mg by mouth daily as needed for headache. Reported on 10/08/2015    . lidocaine-prilocaine (EMLA) cream Apply 1 application topically as needed.    [name] LORazepam (ATIVAN) 0.5 MG tablet Take 0.5 mg by mouth at bedtime as needed (Nausea/Vomiting). Take 1 tablet (0.5 mg total) by mouth at bedtime as needed (Nausea or vomiting).    [name] omeprazole (PRILOSEC) 40 MG capsule Take 1 capsule (40 mg total) by mouth daily. 30 capsule 1  . ondansetron (ZOFRAN) 8 MG tablet Take 8 mg by mouth 2 (two) times daily as needed for nausea or vomiting. Take 1 tablet (8 mg total) by mouth 2 (two) times daily as needed for refractory nausea / vomiting. Start on day 3 after chemo.    . prochlorperazine (COMPAZINE) 10 MG tablet Take 10 mg by mouth every 6 (six) hours as needed for nausea or vomiting.     No current facility-administered medications for  this visit.    OBJECTIVE: Middle-aged African-American woman Who appears stated age 89 Vitals:   11/29/15 1050  BP: 112/76  Pulse: 77  Temp: 97.7 F (36.5 C)  Resp: 18     Body mass index is 29.36 kg/(m^2).    ECOG FS:1 - Symptomatic but completely ambulatory  Sclerae unicteric, EOMs intact Oropharynx clear and moist No cervical or supraclavicular adenopathy Lungs no rales or rhonchi Heart regular rate and rhythm Abd soft, nontender, positive bowel sounds MSK no focal spinal tenderness, no upper extremity lymphedema Neuro: nonfocal, well oriented, appropriate affect Breasts: Deferred    LAB RESULTS:  CMP     Component Value Date/Time   NA 137 11/22/2015 1417   NA 141 11/13/2013 2008   K 4.1 11/22/2015 1417   K 4.0 11/13/2013 2008   CL 103 11/13/2013 2008   CO2 29 11/22/2015 1417   CO2 25 11/13/2013 2008   GLUCOSE 123 11/22/2015 1417   GLUCOSE 91 11/13/2013 2008   BUN 18.0 11/22/2015 1417   BUN 13 11/13/2013 2008   CREATININE 0.9 11/22/2015 1417    CREATININE 1.05 11/13/2013 2008   CALCIUM 8.8 11/22/2015 1417   CALCIUM 9.0 11/13/2013 2008   PROT 6.6 11/22/2015 1417   ALBUMIN 3.3* 11/22/2015 1417   AST 25 11/22/2015 1417   ALT 79* 11/22/2015 1417   ALKPHOS 122 11/22/2015 1417   BILITOT <0.30 11/22/2015 1417   GFRNONAA 61* 11/13/2013 2008   GFRAA 71* 11/13/2013 2008    INo results found for: SPEP, UPEP  Lab Results  Component Value Date   WBC 4.3 11/29/2015   NEUTROABS 3.0 11/29/2015   HGB 13.2 11/29/2015   HCT 40.5 11/29/2015   MCV 85.2 11/29/2015   PLT 259 11/29/2015      Chemistry      Component Value Date/Time   NA 137 11/22/2015 1417   NA 141 11/13/2013 2008   K 4.1 11/22/2015 1417   K 4.0 11/13/2013 2008   CL 103 11/13/2013 2008   CO2 29 11/22/2015 1417   CO2 25 11/13/2013 2008   BUN 18.0 11/22/2015 1417   BUN 13 11/13/2013 2008   CREATININE 0.9 11/22/2015 1417   CREATININE 1.05 11/13/2013 2008      Component Value Date/Time   CALCIUM 8.8 11/22/2015 1417   CALCIUM 9.0 11/13/2013 2008   ALKPHOS 122 11/22/2015 1417   AST 25 11/22/2015 1417   ALT 79* 11/22/2015 1417   BILITOT <0.30 11/22/2015 1417       No results found for: LABCA2  No components found for: LABCA125  No results for input(s): INR in the last 168 hours.  Urinalysis    Component Value Date/Time   COLORURINE YELLOW 11/13/2013 2030   APPEARANCEUR CLOUDY* 11/13/2013 2030   LABSPEC 1.015 10/17/2015 1102   LABSPEC 1.013 11/13/2013 2030   PHURINE 6.5 10/17/2015 1102   PHURINE 5.5 11/13/2013 2030   GLUCOSEU Negative 10/17/2015 1102   GLUCOSEU NEGATIVE 11/13/2013 2030   HGBUR Trace 10/17/2015 [place] NEGATIVE 11/13/2013 2030   BILIRUBINUR Negative 10/17/2015 [place] NEGATIVE 11/13/2013 2030   KETONESUR Negative 10/17/2015 1102   KETONESUR 15* 11/13/2013 2030   PROTEINUR 30 10/17/2015 1102   PROTEINUR NEGATIVE 11/13/2013 2030   UROBILINOGEN 0.2 10/17/2015 1102   UROBILINOGEN 0.2 11/13/2013 2030   NITRITE  Negative 10/17/2015 1102   NITRITE NEGATIVE 11/13/2013 2030   LEUKOCYTESUR Negative 10/17/2015 1102   LEUKOCYTESUR TRACE* 11/13/2013 2030      ELIGIBLE FOR  AVAILABLE RESEARCH PROTOCOL: PALLAS  STUDIES:No results found.    ASSESSMENT: 50 y.o. [place] woman status post left breast upper outer quadrant biopsy 08/03/2015 for a clinical T2 N0, stage IIa invasive ductal carcinoma, grade 3, estrogen and progesterone receptor positive, HER-2 not amplified, with an MIB-1 of 70%  (1) status post left lumpectomy and sentinel lymph node sampling 08/28/2015 for a pT2 pN0, stage IIA invasive ductal carcinoma, grade 3, with close but negative margins. Repeat HER-2 was again negative  (a) oncotype DX Score of 22 predicts a risk of recurrence outside the breast within 10 years of 14% if the patient's only systemic therapy is tamoxifen for 5 years. 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  I am concerned she may be developing associated nausea. I have asked her to take lorazepam before coming here for any future visits. She will also add lorazepam to her anti-emetics for the next 2 days. I warned her that she should avoid driving if possible, while on this medication.  Otherwise she will return for her  final cycle in 2 weeks. I will schedule her to see Dr. Isidore Moos sometime late this month or early next month to start planning her radiation treatments.      Chauncey Cruel, MD   11/29/2015 10:56 AM

## 2015-11-29 NOTE — Telephone Encounter (Signed)
appt made and avs printed °

## 2015-11-29 NOTE — Patient Instructions (Signed)
[facility] Discharge Instructions for Patients Receiving Chemotherapy  Today you received the following chemotherapy agents: Adriamycin and Cytoxan.  To help prevent nausea and vomiting after your treatment, we encourage you to take your nausea medication: Compazine 10 mg every 6 hours as needed; Zofran 8 mg every 12 hours as needed.   If you develop nausea and vomiting that is not controlled by your nausea medication, call the clinic.   BELOW ARE SYMPTOMS THAT SHOULD BE REPORTED IMMEDIATELY:  *FEVER GREATER THAN 100.5 F  *CHILLS WITH OR WITHOUT FEVER  NAUSEA AND VOMITING THAT IS NOT CONTROLLED WITH YOUR NAUSEA MEDICATION  *UNUSUAL SHORTNESS OF BREATH  *UNUSUAL BRUISING OR BLEEDING  TENDERNESS IN MOUTH AND THROAT WITH OR WITHOUT PRESENCE OF ULCERS  *URINARY PROBLEMS  *BOWEL PROBLEMS  UNUSUAL RASH Items with * indicate a potential emergency and should be followed up as soon as possible.  Feel free to call the clinic you have any questions or concerns. The clinic phone number is [phone].  Please show the [facility] at check-in to the Emergency Department and triage nurse.

## 2015-12-06 ENCOUNTER — Ambulatory Visit: Payer: Self-pay | Admitting: Genetic Counselor

## 2015-12-06 NOTE — Progress Notes (Signed)
Opened in error

## 2015-12-13 ENCOUNTER — Telehealth: Payer: Self-pay | Admitting: Oncology

## 2015-12-13 ENCOUNTER — Ambulatory Visit (HOSPITAL_BASED_OUTPATIENT_CLINIC_OR_DEPARTMENT_OTHER): Payer: 59 | Admitting: Oncology

## 2015-12-13 ENCOUNTER — Ambulatory Visit (HOSPITAL_BASED_OUTPATIENT_CLINIC_OR_DEPARTMENT_OTHER): Payer: 59

## 2015-12-13 ENCOUNTER — Encounter: Payer: Self-pay | Admitting: *Deleted

## 2015-12-13 ENCOUNTER — Other Ambulatory Visit (HOSPITAL_BASED_OUTPATIENT_CLINIC_OR_DEPARTMENT_OTHER): Payer: 59

## 2015-12-13 ENCOUNTER — Ambulatory Visit: Payer: 59

## 2015-12-13 VITALS — BP 113/70 | HR 86 | Temp 98.4°F | Resp 18 | Ht 65.5 in | Wt 180.2 lb

## 2015-12-13 DIAGNOSIS — N951 Menopausal and female climacteric states: Secondary | ICD-10-CM | POA: Diagnosis not present

## 2015-12-13 DIAGNOSIS — Z170 Estrogen receptor positive status [ER+]: Secondary | ICD-10-CM | POA: Diagnosis not present

## 2015-12-13 DIAGNOSIS — C50412 Malignant neoplasm of upper-outer quadrant of left female breast: Secondary | ICD-10-CM

## 2015-12-13 DIAGNOSIS — Z5111 Encounter for antineoplastic chemotherapy: Secondary | ICD-10-CM

## 2015-12-13 LAB — CBC WITH DIFFERENTIAL/PLATELET
BASO%: 0.5 % (ref 0.0–2.0)
BASOS ABS: 0 10*3/uL (ref 0.0–0.1)
EOS ABS: 0 10*3/uL (ref 0.0–0.5)
EOS%: 0 % (ref 0.0–7.0)
HEMATOCRIT: 37.1 % (ref 34.8–46.6)
HGB: 12.3 g/dL (ref 11.6–15.9)
LYMPH#: 0.8 10*3/uL — AB (ref 0.9–3.3)
LYMPH%: 12.5 % — AB (ref 14.0–49.7)
MCH: 28 pg (ref 25.1–34.0)
MCHC: 33.2 g/dL (ref 31.5–36.0)
MCV: 84.3 fL (ref 79.5–101.0)
MONO#: 0.5 10*3/uL (ref 0.1–0.9)
MONO%: 7.8 % (ref 0.0–14.0)
NEUT#: 5 10*3/uL (ref 1.5–6.5)
NEUT%: 79.2 % — AB (ref 38.4–76.8)
PLATELETS: 197 10*3/uL (ref 145–400)
RBC: 4.4 10*6/uL (ref 3.70–5.45)
RDW: 16.5 % — ABNORMAL HIGH (ref 11.2–14.5)
WBC: 6.3 10*3/uL (ref 3.9–10.3)
nRBC: 1 % — ABNORMAL HIGH (ref 0–0)

## 2015-12-13 LAB — COMPREHENSIVE METABOLIC PANEL
ALK PHOS: 124 U/L (ref 40–150)
ALT: 85 U/L — ABNORMAL HIGH (ref 0–55)
ANION GAP: 9 meq/L (ref 3–11)
AST: 29 U/L (ref 5–34)
Albumin: 3.5 g/dL (ref 3.5–5.0)
BUN: 17.2 mg/dL (ref 7.0–26.0)
CALCIUM: 9.3 mg/dL (ref 8.4–10.4)
CO2: 27 mEq/L (ref 22–29)
Chloride: 107 mEq/L (ref 98–109)
Creatinine: 1 mg/dL (ref 0.6–1.1)
EGFR: 76 mL/min/{1.73_m2} — AB (ref >90)
Glucose: 101 mg/dl (ref 70–140)
POTASSIUM: 4.2 meq/L (ref 3.5–5.1)
Sodium: 143 mEq/L (ref 136–145)
Total Bilirubin: <0.3 mg/dL (ref 0.20–1.20)
Total Protein: 6.8 g/dL (ref 6.4–8.3)

## 2015-12-13 MED ORDER — DOXORUBICIN HCL CHEMO IV INJECTION 2 MG/ML
60.0000 mg/m2 | Freq: Once | INTRAVENOUS | Status: AC
  Administered 2015-12-13: 116 mg via INTRAVENOUS
  Filled 2015-12-13: qty 58

## 2015-12-13 MED ORDER — FOSAPREPITANT DIMEGLUMINE INJECTION 150 MG
Freq: Once | INTRAVENOUS | Status: AC
  Administered 2015-12-13: 13:00:00 via INTRAVENOUS
  Filled 2015-12-13: qty 5

## 2015-12-13 MED ORDER — PALONOSETRON HCL INJECTION 0.25 MG/5ML
INTRAVENOUS | Status: AC
  Filled 2015-12-13: qty 5

## 2015-12-13 MED ORDER — PALONOSETRON HCL INJECTION 0.25 MG/5ML
0.2500 mg | Freq: Once | INTRAVENOUS | Status: AC
  Administered 2015-12-13: 0.25 mg via INTRAVENOUS

## 2015-12-13 MED ORDER — HEPARIN SOD (PORK) LOCK FLUSH 100 UNIT/ML IV SOLN
500.0000 [IU] | Freq: Once | INTRAVENOUS | Status: AC | PRN
  Administered 2015-12-13: 500 [IU]
  Filled 2015-12-13: qty 5

## 2015-12-13 MED ORDER — SODIUM CHLORIDE 0.9 % IV SOLN
Freq: Once | INTRAVENOUS | Status: AC
  Administered 2015-12-13: 13:00:00 via INTRAVENOUS

## 2015-12-13 MED ORDER — SODIUM CHLORIDE 0.9 % IV SOLN
600.0000 mg/m2 | Freq: Once | INTRAVENOUS | Status: AC
  Administered 2015-12-13: 1160 mg via INTRAVENOUS
  Filled 2015-12-13: qty 58

## 2015-12-13 MED ORDER — PEGFILGRASTIM 6 MG/0.6ML ~~LOC~~ PSKT
6.0000 mg | PREFILLED_SYRINGE | Freq: Once | SUBCUTANEOUS | Status: AC
  Administered 2015-12-13: 6 mg via SUBCUTANEOUS
  Filled 2015-12-13: qty 0.6

## 2015-12-13 MED ORDER — SODIUM CHLORIDE 0.9% FLUSH
10.0000 mL | INTRAVENOUS | Status: DC | PRN
  Administered 2015-12-13: 10 mL
  Filled 2015-12-13: qty 10

## 2015-12-13 NOTE — Patient Instructions (Signed)
[facility] Discharge Instructions for Patients Receiving Chemotherapy  Today you received the following chemotherapy agents:Adriamycin and Cytoxan   To help prevent nausea and vomiting after your treatment, we encourage you to take your nausea medication as directed.    If you develop nausea and vomiting that is not controlled by your nausea medication, call the clinic.   BELOW ARE SYMPTOMS THAT SHOULD BE REPORTED IMMEDIATELY:  *FEVER GREATER THAN 100.5 F  *CHILLS WITH OR WITHOUT FEVER  NAUSEA AND VOMITING THAT IS NOT CONTROLLED WITH YOUR NAUSEA MEDICATION  *UNUSUAL SHORTNESS OF BREATH  *UNUSUAL BRUISING OR BLEEDING  TENDERNESS IN MOUTH AND THROAT WITH OR WITHOUT PRESENCE OF ULCERS  *URINARY PROBLEMS  *BOWEL PROBLEMS  UNUSUAL RASH Items with * indicate a potential emergency and should be followed up as soon as possible.  Feel free to call the clinic you have any questions or concerns. The clinic phone number is [phone].  Please show the CHEMO ALERT CARD at check-in to the Emergency Department and triage nurse.   

## 2015-12-13 NOTE — Telephone Encounter (Signed)
appt made and avs printed °

## 2015-12-13 NOTE — Progress Notes (Signed)
[place]  [phone] Fax:(336) [phone]   ID: Ashley Tyler DOB: January 24, 1966  MR#: [id number]  JYN#:[id number]  Patient Care Team: No Pcp Per Patient as PCP - General (General Practice) Chauncey Cruel, MD as Consulting Physician (Oncology) [name], MD as Consulting Physician (General Surgery) [name], MD as Attending Physician (Radiation Oncology) PCP: No PCP Per Patient GYN: OTHER MD:  CHIEF COMPLAINT: Estrogen receptor positive breast cancer  CURRENT TREATMENT: Adjuvant chemotherapy  BREAST CANCER HISTORY: From the original intake note:  Ashley Tyler had screening mammography at Dr. Caralee Ates office 07/24/2015 suggesting a left breast mass and the patient was referred to the [facility] where on 07/30/2015 she underwent left diagnostic mammography with tomosynthesis and left breast ultrasonography. The breast density was category C. In the upper outer quadrant of the left breast there was a 2.3 cm spiculated mass. There were no associated calcifications. This mass was palpable at the 1:00 position 5 cm from the nipple. Ultrasound confirmed an irregular hypoechoic mass measuring 1.8 cm. Ultrasonography of the left axilla was negative.  On 08/03/2015 the patient underwent biopsy of the left breast mass in question. 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  The patient then met with surgery and after appropriate discussion she proceeded to left lumpectomy and sentinel lymph node sampling 08/28/2015. The final pathology ([id number]) showed invasive ductal carcinoma, grade 3, measuring 2.1 cm. Margins were close but negative. All 3 sentinel lymph nodes were clear. Repeat HER-2 was again negative, with a  signals ratio of 0.9, and number per cell 2.20.  INTERVAL HISTORY: Ashley Tyler returns today for her third and final cycle of cyclophosphamide and doxorubicin, even in dose dense fashion 3, accompanied by her daughter. This is her fourth chemotherapy cycle pending her first cyclophosphamide and docetaxel treatment, which she tolerated poorly, leading to the change.   REVIEW OF SYSTEMS: Ashley Tyler tripped and fell injuring her "tailbone". Aside from little soreness however there are no other problems and in particular change in her leg strength or lytic feeling. Been no change in bowel or bladder habits. She has "terrible night sweats" which we'll let her sleep. She also has daytime hot flashes but these are much less of an issue. She does not have nausea or vomiting problems. A detailed review of systems today was otherwise stable.  PAST MEDICAL HISTORY: Past Medical History  Diagnosis Date  . Headache   . Cancer ([place])     left breast  . Endometriosis   . Migraines   . Chronic constipation   . Family history of breast cancer   . Family history of prostate cancer     PAST SURGICAL HISTORY: Past Surgical History  Procedure Laterality Date  . Cesarean section    . Tonsillectomy    . Laparoscopic salpingo oopherectomy Right   . Breast lumpectomy with radioactive seed and sentinel lymph node biopsy Left 08/28/2015    Procedure: LEFT BREAST LUMPECTOMY WITH RADIOACTIVE SEED AND SENTINEL LYMPH NODE BIOPSY;  Surgeon: [name], MD;  Location: [facility];  Service: General;  Laterality: Left;    FAMILY HISTORY Family History  Problem Relation Age of Onset  . Prostate cancer Father 21    Gleason = 7  . Prostate cancer Maternal Uncle     dx  in his 54s  . Breast cancer Paternal Aunt     dx in her 41s  . Prostate cancer Paternal Uncle   . Alzheimer's disease Maternal Grandmother   . Lung cancer Maternal Grandfather   . Congestive Heart Failure Paternal Grandmother   .  Prostate cancer Maternal Uncle     dx in his 29s  . Breast cancer Paternal Aunt     dx in her 32s; maternal half paternal aunt  . Prostate cancer Paternal Uncle     dx in his 48s  . Breast cancer Cousin   . Breast cancer Cousin     father's maternal cousin's daughter; dx in her 36s-30s  The patient's parents are still living, in their early 46s as of April 2016. The patient is an only child. On her father's side there are 4 cases of breast cancer, 2 aunts diagnosed in their 35s and 78s, and 2 cousins diagnosed in her 55s and one at age 53. The patient's father had prostate cancer diagnosed age 76.  GYNECOLOGIC HISTORY:  No LMP recorded. Menarche age 20, first live birth age 66. The patient is GX P2, although both children were born early. She is status post right salpingo-oophorectomy due to endometriosis. 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 At home is just she and her daughter Ashley Tyler, 31. Her other daughter, Ashley Tyler, is studying criminal justice at [employer]. Her SO is this is with. Ashley Tyler    ADVANCED DIRECTIVES: Not in place   HEALTH MAINTENANCE: Social History  Substance Use Topics  . Smoking status: Never Smoker   . Smokeless tobacco: Not on file  . Alcohol Use: Yes     Comment: social     Colonoscopy:  PAP:  Bone density:  Lipid panel:  Allergies  Allergen Reactions  . Tetracyclines & Related Itching  . Other Rash    Powder in gloves causes rash    Current Outpatient Prescriptions  Medication Sig Dispense Refill  . dexamethasone (DECADRON) 4 MG tablet Take 2 tablets by mouth once a day on the day after chemotherapy and then take 2 tablets two times a day for 2 days. Take with food. 30 tablet 1  . ibuprofen (ADVIL,MOTRIN) 200 MG tablet Take 400 mg by mouth daily as needed for headache. Reported on 10/08/2015    .  lidocaine-prilocaine (EMLA) cream Apply 1 application topically as needed.    [name] LORazepam (ATIVAN) 0.5 MG tablet Take 0.5 mg by mouth at bedtime as needed (Nausea/Vomiting). Take 1 tablet (0.5 mg total) by mouth at bedtime as needed (Nausea or vomiting).    [name] omeprazole (PRILOSEC) 40 MG capsule Take 1 capsule (40 mg total) by mouth daily. 30 capsule 1  . ondansetron (ZOFRAN) 8 MG tablet Take 8 mg by mouth 2 (two) times daily as needed for nausea or vomiting. Take 1 tablet (8 mg total) by mouth 2 (two) times daily as needed for refractory nausea / vomiting. Start on day 3 after chemo.    . prochlorperazine (COMPAZINE) 10 MG tablet Take 10 mg by mouth every 6 (six) hours as needed for nausea or vomiting.     No current facility-administered medications for this visit.    OBJECTIVE: Middle-aged African-American woman  In no acute distress  Filed Vitals:   12/13/15 1037  BP: 113/70  Pulse: 86  Temp: 98.4 F (36.9 C)  Resp: 18     Body mass index is 29.52 kg/(m^2).    ECOG FS:1 - Symptomatic but completely ambulatory  Sclerae unicteric, pupils round and equal Oropharynx clear and moist-- no thrush or other lesions No cervical or supraclavicular adenopathy Lungs no rales or rhonchi Heart regular rate and rhythm Abd soft, nontender, positive bowel sounds MSK no focal spinal tenderness including palpation of the lumbosacral area, no upper extremity lymphedema Neuro: nonfocal, well oriented, appropriate affect Breasts: Deferred      LAB RESULTS:  CMP     Component Value Date/Time   NA 141 11/29/2015 1035   NA 141 11/13/2013 2008   K 4.0 11/29/2015 1035   K 4.0 11/13/2013 2008   CL 103 11/13/2013 2008   CO2 27 11/29/2015 1035   CO2 25 11/13/2013 2008   GLUCOSE 129 11/29/2015 1035   GLUCOSE 91 11/13/2013 2008   BUN 15.1 11/29/2015 1035   BUN 13 11/13/2013 2008   CREATININE 1.0 11/29/2015 1035   CREATININE 1.05 11/13/2013 2008   CALCIUM 9.6 11/29/2015 1035   CALCIUM 9.0  11/13/2013 2008   PROT 7.3 11/29/2015 1035   ALBUMIN 3.7 11/29/2015 1035   AST 24 11/29/2015 1035   ALT 77* 11/29/2015 1035   ALKPHOS 112 11/29/2015 1035   BILITOT <0.30 11/29/2015 1035   GFRNONAA 61* 11/13/2013 2008   GFRAA 71* 11/13/2013 2008    INo results found for: SPEP, UPEP  Lab Results  Component Value Date   WBC 6.3 12/13/2015   NEUTROABS 5.0 12/13/2015   HGB 12.3 12/13/2015   HCT 37.1 12/13/2015   MCV 84.3 12/13/2015   PLT 197 12/13/2015      Chemistry      Component Value Date/Time   NA 141 11/29/2015 1035   NA 141 11/13/2013 2008   K 4.0 11/29/2015 1035   K 4.0 11/13/2013 2008   CL 103 11/13/2013 2008   CO2 27 11/29/2015 1035   CO2 25 11/13/2013 2008   BUN 15.1 11/29/2015 1035   BUN 13 11/13/2013 2008   CREATININE 1.0 11/29/2015 1035   CREATININE 1.05 11/13/2013 2008      Component Value Date/Time   CALCIUM 9.6 11/29/2015 1035   CALCIUM 9.0 11/13/2013 2008   ALKPHOS 112 11/29/2015 1035   AST 24 11/29/2015 1035   ALT 77* 11/29/2015 1035   BILITOT <0.30 11/29/2015 1035       No results found for: LABCA2  No components found for: LABCA125  No results for input(s): INR in the last 168 hours.  Urinalysis    Component Value Date/Time   COLORURINE YELLOW 11/13/2013 2030   APPEARANCEUR CLOUDY* 11/13/2013 2030   LABSPEC 1.015 10/17/2015 1102   LABSPEC 1.013 11/13/2013 2030   PHURINE 6.5 10/17/2015 1102   PHURINE 5.5 11/13/2013 2030   GLUCOSEU Negative 10/17/2015 1102   GLUCOSEU NEGATIVE 11/13/2013 2030   HGBUR Trace 10/17/2015 [place] NEGATIVE 11/13/2013 2030   BILIRUBINUR Negative 10/17/2015 [place] NEGATIVE 11/13/2013 2030   KETONESUR Negative 10/17/2015 1102   KETONESUR 15* 11/13/2013 2030   PROTEINUR 30 10/17/2015 1102   PROTEINUR NEGATIVE 11/13/2013 2030   UROBILINOGEN 0.2 10/17/2015 1102   UROBILINOGEN 0.2 11/13/2013 2030   NITRITE Negative 10/17/2015 1102   NITRITE NEGATIVE 11/13/2013 2030   LEUKOCYTESUR Negative  10/17/2015 1102   LEUKOCYTESUR TRACE* 11/13/2013 2030      ELIGIBLE FOR AVAILABLE RESEARCH PROTOCOL: PALLAS  STUDIES:No results found.    ASSESSMENT: 50 y.o. [place] woman status  post left breast upper outer quadrant biopsy 08/03/2015 for a clinical T2 N0, stage IIa invasive ductal carcinoma, grade 3, estrogen and progesterone receptor positive, HER-2 not amplified, with an MIB-1 of 70%  (1) status post left lumpectomy and sentinel lymph node sampling 08/28/2015 for a pT2 pN0, stage IIA invasive ductal carcinoma, grade 3, with close but negative margins. Repeat HER-2 was again negative  (a) oncotype DX Score of 22 predicts a risk of recurrence outside the breast within 10 years of 14% if the patient's only systemic therapy is tamoxifen for 5 years. 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 She will come next week just for lab work, and then she will meet with radiation oncology to start planning the completion of her local regional treatment.  She is going to see me in approximately 3 weeks at which time I will schedule her to see me at the completion of radiation, which is when we will be starting anti-estrogens.  I strongly suggested that she start gabapentin at bedtime which will make in an almost difference to her ability to sleep since she is having significant  night sweats.  She knows to call for any problems that may develop before her next visit here      Chauncey Cruel, MD   12/13/2015 10:55 AM

## 2015-12-14 ENCOUNTER — Other Ambulatory Visit: Payer: Self-pay

## 2015-12-14 DIAGNOSIS — C50412 Malignant neoplasm of upper-outer quadrant of left female breast: Secondary | ICD-10-CM

## 2015-12-14 DIAGNOSIS — R232 Flushing: Secondary | ICD-10-CM

## 2015-12-14 MED ORDER — GABAPENTIN 100 MG PO CAPS: 100.0000 mg | ORAL_CAPSULE | Freq: Every day | ORAL | Status: DC

## 2015-12-20 ENCOUNTER — Ambulatory Visit (HOSPITAL_BASED_OUTPATIENT_CLINIC_OR_DEPARTMENT_OTHER): Payer: 59

## 2015-12-20 ENCOUNTER — Telehealth: Payer: Self-pay | Admitting: Oncology

## 2015-12-20 DIAGNOSIS — C50412 Malignant neoplasm of upper-outer quadrant of left female breast: Secondary | ICD-10-CM | POA: Diagnosis not present

## 2015-12-20 LAB — COMPREHENSIVE METABOLIC PANEL
ALT: 36 U/L (ref 0–55)
ANION GAP: 7 meq/L (ref 3–11)
AST: 15 U/L (ref 5–34)
Albumin: 3.5 g/dL (ref 3.5–5.0)
Alkaline Phosphatase: 114 U/L (ref 40–150)
BUN: 19 mg/dL (ref 7.0–26.0)
CALCIUM: 9.1 mg/dL (ref 8.4–10.4)
CHLORIDE: 102 meq/L (ref 98–109)
CO2: 30 meq/L — AB (ref 22–29)
CREATININE: 0.8 mg/dL (ref 0.6–1.1)
Glucose: 97 mg/dl (ref 70–140)
Potassium: 4.5 mEq/L (ref 3.5–5.1)
Sodium: 139 mEq/L (ref 136–145)
Total Protein: 6.7 g/dL (ref 6.4–8.3)

## 2015-12-20 LAB — CBC WITH DIFFERENTIAL/PLATELET
BASO%: 1.3 % (ref 0.0–2.0)
BASOS ABS: 0 10*3/uL (ref 0.0–0.1)
EOS ABS: 0 10*3/uL (ref 0.0–0.5)
EOS%: 0 % (ref 0.0–7.0)
HEMATOCRIT: 33.8 % — AB (ref 34.8–46.6)
HGB: 11.2 g/dL — ABNORMAL LOW (ref 11.6–15.9)
LYMPH#: 0.6 10*3/uL — AB (ref 0.9–3.3)
LYMPH%: 39.2 % (ref 14.0–49.7)
MCH: 27.9 pg (ref 25.1–34.0)
MCHC: 33.1 g/dL (ref 31.5–36.0)
MCV: 84.3 fL (ref 79.5–101.0)
MONO#: 0.1 10*3/uL (ref 0.1–0.9)
MONO%: 6.3 % (ref 0.0–14.0)
NEUT#: 0.8 10*3/uL — ABNORMAL LOW (ref 1.5–6.5)
NEUT%: 53.2 % (ref 38.4–76.8)
PLATELETS: 95 10*3/uL — AB (ref 145–400)
RBC: 4.01 10*6/uL (ref 3.70–5.45)
RDW: 16.1 % — ABNORMAL HIGH (ref 11.2–14.5)
WBC: 1.6 10*3/uL — ABNORMAL LOW (ref 3.9–10.3)

## 2015-12-20 NOTE — Telephone Encounter (Signed)
Pt is here now and wants lab today instead tomorrrow , added lab on today per darlena

## 2015-12-21 ENCOUNTER — Other Ambulatory Visit: Payer: 59

## 2015-12-21 ENCOUNTER — Ambulatory Visit (HOSPITAL_BASED_OUTPATIENT_CLINIC_OR_DEPARTMENT_OTHER): Payer: 59 | Admitting: Oncology

## 2015-12-21 ENCOUNTER — Telehealth: Payer: Self-pay | Admitting: Oncology

## 2015-12-21 VITALS — BP 118/73 | HR 83 | Temp 98.6°F | Resp 18 | Ht 65.5 in | Wt 182.4 lb

## 2015-12-21 DIAGNOSIS — C50412 Malignant neoplasm of upper-outer quadrant of left female breast: Secondary | ICD-10-CM | POA: Diagnosis not present

## 2015-12-21 DIAGNOSIS — Z170 Estrogen receptor positive status [ER+]: Secondary | ICD-10-CM

## 2015-12-21 DIAGNOSIS — M8580 Other specified disorders of bone density and structure, unspecified site: Secondary | ICD-10-CM

## 2015-12-21 NOTE — Progress Notes (Signed)
[facility]  Telephone:(336) [phone] Fax:(336) [phone]   ID: Ashley Tyler DOB: 10-01-1965  MR#: [id number]  KGY#:[id number]  Patient Care Team: No Pcp Per Patient as PCP - General (General Practice) Chauncey Cruel, MD as Consulting Physician (Oncology) [name], MD as Consulting Physician (General Surgery) [name], MD as Attending Physician (Radiation Oncology) PCP: No PCP Per Patient GYN: OTHER MD:  CHIEF COMPLAINT: Estrogen receptor positive breast cancer  CURRENT TREATMENT: Adjuvant radiation pending  BREAST CANCER HISTORY: From the original intake note:  Ashley Tyler had screening mammography at Dr. Caralee Ates office 07/24/2015 suggesting a left breast mass and the patient was referred to the [facility] where on 07/30/2015 she underwent left diagnostic mammography with tomosynthesis and left breast ultrasonography. The breast density was category C. In the upper outer quadrant of the left breast there was a 2.3 cm spiculated mass. There were no associated calcifications. This mass was palpable at the 1:00 position 5 cm from the nipple. Ultrasound confirmed an irregular hypoechoic mass measuring 1.8 cm. Ultrasonography of the left axilla was negative.  On 08/03/2015 the patient underwent biopsy of the left breast mass in question. The pathology ([id number]) showed an invasive ductal carcinoma, grade 3, estrogen receptor 60% positive, progesterone receptor 20% positive, both with strong staining intensity, with an MIB-1 of 70%, and HER-2 nonamplified, with a signals ratio of 1.18 and the number Purcell 2.00.  The patient then met with surgery and after appropriate discussion she proceeded to left lumpectomy and sentinel lymph node sampling 08/28/2015. The final pathology ([id number]) showed invasive ductal carcinoma, grade 3, measuring 2.1 cm. Margins were close but negative. All 3 sentinel lymph nodes were clear. Repeat HER-2 was again negative,  with a signals ratio of 0.9, and number per cell 2.20.  INTERVAL HISTORY: Ashley Tyler returns today 8 days after receiving her third and final cycle of cyclophosphamide. She tolerated the treatment well. She does admit to some fatigue. She is planning to resume her exercise programs at the [employer]. She sometimes has pain in the surgical breast, which tends to be achy but sometimes shooting. She had no problems with nausea vomiting, mouth sores, or intercurrent fever rash or bleeding.  REVIEW OF SYSTEMS: Aside from the problems just mentioned, and a detailed review of systems today was stable.  PAST MEDICAL HISTORY: Past Medical History  Diagnosis Date  . Headache   . Cancer ([place])     left breast  . Endometriosis   . Migraines   . Chronic constipation   . Family history of breast cancer   . Family history of prostate cancer     PAST SURGICAL HISTORY: Past Surgical History  Procedure Laterality Date  . Cesarean section    . Tonsillectomy    . Laparoscopic salpingo oopherectomy Right   . Breast lumpectomy with radioactive seed and sentinel lymph node biopsy Left 08/28/2015    Procedure: LEFT BREAST LUMPECTOMY WITH RADIOACTIVE SEED AND SENTINEL LYMPH NODE BIOPSY;  Surgeon: [name], MD;  Location: [facility];  Service: General;  Laterality: Left;    FAMILY HISTORY Family History  Problem Relation Age of Onset  . Prostate cancer Father 58    Gleason = 7  . Prostate cancer Maternal Uncle     dx in his 37s  . Breast cancer Paternal Aunt     dx in her 58s  . Prostate cancer Paternal Uncle   . Alzheimer's disease Maternal Grandmother   . Lung cancer Maternal Grandfather   .  Congestive Heart Failure Paternal Grandmother   . Prostate cancer Maternal Uncle     dx in his 61s  . Breast cancer Paternal Aunt     dx in her 20s; maternal half paternal aunt  . Prostate cancer Paternal Uncle     dx in his 64s  . Breast cancer Cousin   . Breast cancer  Cousin     father's maternal cousin's daughter; dx in her 31s-30s  The patient's parents are still living, in their early 63s as of April 2016. The patient is an only child. On her father's side there are 4 cases of breast cancer, 2 aunts diagnosed in their 33s and 46s, and 2 cousins diagnosed in her 67s and one at age 22. The patient's father had prostate cancer diagnosed age 3.  GYNECOLOGIC HISTORY:  No LMP recorded. Menarche age 62, first live birth age 53. The patient is GX P2, although both children were born early. She is status post right salpingo-oophorectomy due to endometriosis. 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 At home is just she and her daughter Ashley Tyler, 14. Her other daughter, Ashley Tyler, is studying criminal justice at [employer]. Her SO is this is with. Ashley Tyler    ADVANCED DIRECTIVES: Not in place   HEALTH MAINTENANCE: Social History  Substance Use Topics  . Smoking status: Never Smoker   . Smokeless tobacco: Not on file  . Alcohol Use: Yes     Comment: social     Colonoscopy:  PAP:  Bone density:  Lipid panel:  Allergies  Allergen Reactions  . Tetracyclines & Related Itching  . Other Rash    Powder in gloves causes rash    Current Outpatient Prescriptions  Medication Sig Dispense Refill  . gabapentin (NEURONTIN) 100 MG capsule Take 1 capsule (100 mg total) by mouth at bedtime. 30 capsule 2  . ibuprofen (ADVIL,MOTRIN) 200 MG tablet Take 400 mg by mouth daily as needed for headache. Reported on 10/08/2015    . LORazepam (ATIVAN) 0.5 MG tablet Take 0.5 mg by mouth at bedtime as needed (Nausea/Vomiting). Take 1 tablet (0.5 mg total) by mouth at bedtime as needed (Nausea or vomiting).    [name] omeprazole (PRILOSEC) 40 MG capsule Take 1 capsule (40 mg total) by mouth daily. 30 capsule 1   No current facility-administered  medications for this visit.    OBJECTIVE: Middle-aged African-American woman Who appears stated age  50 Vitals:   12/21/15 1516  BP: 118/73  Pulse: 83  Temp: 98.6 F (37 C)  Resp: 18     Body mass index is 29.88 kg/(m^2).    ECOG FS:1 - Symptomatic but completely ambulatory  Sclerae unicteric, EOMs intact Oropharynx clear and moist No cervical or supraclavicular adenopathy Lungs no rales or rhonchi Heart regular rate and rhythm Abd soft, nontender, positive bowel sounds MSK no focal spinal tenderness, no upper extremity lymphedema Neuro: nonfocal, well oriented, appropriate affect Breasts: The right breast is unremarkable. The left breast is status post lumpectomy. There is no palpable mass, no erythema, no swelling, and no unusual finding. Left axilla is benign.  LAB RESULTS:  CMP     Component Value Date/Time   NA 139 12/20/2015 1503   NA 141 11/13/2013 2008   K 4.5 12/20/2015 1503   K 4.0 11/13/2013 2008   CL 103 11/13/2013 2008   CO2 30* 12/20/2015  1503   CO2 25 11/13/2013 2008   GLUCOSE 97 12/20/2015 1503   GLUCOSE 91 11/13/2013 2008   BUN 19.0 12/20/2015 1503   BUN 13 11/13/2013 2008   CREATININE 0.8 12/20/2015 1503   CREATININE 1.05 11/13/2013 2008   CALCIUM 9.1 12/20/2015 1503   CALCIUM 9.0 11/13/2013 2008   PROT 6.7 12/20/2015 1503   ALBUMIN 3.5 12/20/2015 1503   AST 15 12/20/2015 1503   ALT 36 12/20/2015 1503   ALKPHOS 114 12/20/2015 1503   BILITOT <0.30 12/20/2015 1503   GFRNONAA 61* 11/13/2013 2008   GFRAA 71* 11/13/2013 2008    INo results found for: SPEP, UPEP  Lab Results  Component Value Date   WBC 1.6* 12/20/2015   NEUTROABS 0.8* 12/20/2015   HGB 11.2* 12/20/2015   HCT 33.8* 12/20/2015   MCV 84.3 12/20/2015   PLT 95* 12/20/2015      Chemistry      Component Value Date/Time   NA 139 12/20/2015 1503   NA 141 11/13/2013 2008   K 4.5 12/20/2015 1503   K 4.0 11/13/2013 2008   CL 103 11/13/2013 2008   CO2 30* 12/20/2015 1503    CO2 25 11/13/2013 2008   BUN 19.0 12/20/2015 1503   BUN 13 11/13/2013 2008   CREATININE 0.8 12/20/2015 1503   CREATININE 1.05 11/13/2013 2008      Component Value Date/Time   CALCIUM 9.1 12/20/2015 1503   CALCIUM 9.0 11/13/2013 2008   ALKPHOS 114 12/20/2015 1503   AST 15 12/20/2015 1503   ALT 36 12/20/2015 1503   BILITOT <0.30 12/20/2015 1503       No results found for: LABCA2  No components found for: LABCA125  No results for input(s): INR in the last 168 hours.  Urinalysis    Component Value Date/Time   COLORURINE YELLOW 11/13/2013 2030   APPEARANCEUR CLOUDY* 11/13/2013 2030   LABSPEC 1.015 10/17/2015 1102   LABSPEC 1.013 11/13/2013 2030   PHURINE 6.5 10/17/2015 1102   PHURINE 5.5 11/13/2013 2030   GLUCOSEU Negative 10/17/2015 1102   GLUCOSEU NEGATIVE 11/13/2013 2030   HGBUR Trace 10/17/2015 [place] NEGATIVE 11/13/2013 2030   BILIRUBINUR Negative 10/17/2015 [place] NEGATIVE 11/13/2013 2030   KETONESUR Negative 10/17/2015 1102   KETONESUR 15* 11/13/2013 2030   PROTEINUR 30 10/17/2015 1102   PROTEINUR NEGATIVE 11/13/2013 2030   UROBILINOGEN 0.2 10/17/2015 1102   UROBILINOGEN 0.2 11/13/2013 2030   NITRITE Negative 10/17/2015 1102   NITRITE NEGATIVE 11/13/2013 2030   LEUKOCYTESUR Negative 10/17/2015 1102   LEUKOCYTESUR TRACE* 11/13/2013 2030      ELIGIBLE FOR AVAILABLE RESEARCH PROTOCOL: PALLAS  STUDIES:No results found.    ASSESSMENT: 50 y.o. Ashley Tyler woman status post left breast upper outer quadrant biopsy 08/03/2015 for a clinical T2 N0, stage IIa invasive ductal carcinoma, grade 3, estrogen and progesterone receptor positive, HER-2 not amplified, with an MIB-1 of 70%  (1) status post left lumpectomy and sentinel lymph node sampling 08/28/2015 for a pT2 pN0, stage IIA invasive ductal carcinoma, grade 3, with close but negative margins. Repeat HER-2 was again negative  (a) oncotype DX Score of 22 predicts a risk of recurrence outside the  breast within 10 years of 14% if the patient's only systemic therapy is tamoxifen for 5 years. 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 Her ANC is almost 1000. I have alerted her to call us urgently if she has a fever in the next week, but I do not expect that.  She is gaining some weight because she is having an upset stomach. She is on omeprazole. I suggested she try Tums in addition as needed. She needs to start a regular exercise program and ideally well before she starts her radiation treatments.  I reassured her that these shooting/stinging pain she sometimes experiences in the left breast are very common, and that they do not indicate that there is any residual breast cancer in the breast.  She will meet with Dr. Isidore Moos at the end of this month. She will probably start radiation shortly thereafter. She should be getting down around the middle of September.  She would like her port removed as soon as possible. I have let her surgeon know that from our point of view it is at his discretion.  I have set her up for a bone density the first week in September. She will see me shortly thereafter and at that point we will decide which antiestrogen to start with.  Ashley Tyler knows to call for any problems that may develop before her next visit here.     Chauncey Cruel, MD    12/21/2015 6:21 PM

## 2015-12-21 NOTE — Telephone Encounter (Signed)
per pof to sch pt appt-gave pt copy of avs °

## 2015-12-25 ENCOUNTER — Other Ambulatory Visit: Payer: 59

## 2015-12-26 NOTE — Progress Notes (Signed)
Location of Breast Cancer: Left Breast  Histology per Pathology Report:  08/03/15 Diagnosis Breast, left, needle core biopsy, 1:00 o'clock - INVASIVE DUCTAL CARCINOMA, GRADE 3.  08/28/15 Diagnosis 1. Breast, lumpectomy, Left - INVASIVE DUCTAL CARCINOMA, GRADE III/III, SPANNING 2.1 CM - DUCTAL CARCINOMA IN SITU, INTERMEDIATE GRADE. - INVASIVE DUCTAL CARCINOMA IS BROADLY LESS THAN 0.1 CM TO THE POSTERIOR MARGIN OF SPECIMEN #1. - INVASIVE DUCTAL CARCINOMA IS FOCALLY LESS THAN 0.1 CM TO THE MEDIAL MARGIN OF SPECIMEN #1. - SEE ONCOLOGY TABLE BELOW. 2. Breast, excision, Left, further posterior margin - BENIGN BREAST PARENCHYMA. - THERE IS NO EVIDENCE OF MALIGNANCY. - SEE COMMENT. 3. Breast, excision, Left, further superior margin 1 of 4 FINAL for [name], Ashley Tyler ([id number]) Diagnosis(continued) - BENIGN BREAST PARENCHYMA. - THERE IS NO EVIDENCE OF MALIGNANCY. - SEE COMMENT. 4. Lymph node, sentinel, biopsy, Left Axillary Sentinel #1 - THERE IS NO EVIDENCE OF CARCINOMA IN 1 OF 1 LYMPH NODE (0/1). 5. Lymph node, sentinel, biopsy, Left Axillary Sentinel #2 - THERE IS NO EVIDENCE OF CARCINOMA IN 1 OF 1 LYMPH NODE (0/1). 6. Lymph node, sentinel, biopsy, Left Axillary Sentinel #3 - THERE IS NO EVIDENCE OF CARCINOMA IN 1 OF 1 LYMPH NODE (0/1).   Receptor Status: ER(60%), PR (20%), Her2-neu (NEG), Ki-(70%)  Did patient present with symptoms or was this found on screening mammography?: It was found on a screening mammogram 07/24/15.  Past/Anticipated interventions by surgeon, if any: . Breast lumpectomy with radioactive seed and sentinel lymph node biopsy Left 08/28/2015    Procedure: LEFT BREAST LUMPECTOMY WITH RADIOACTIVE SEED AND SENTINEL LYMPH NODE BIOPSY;  Surgeon: [name], MD;  Location: [facility];  Service: General;  Laterality: Left;     Past/Anticipated interventions by medical oncology, if any:  Dr. [name]  Adjuvant chemotherapy consisting  of cyclophosphamide and  docetaxel started 10/11/2015                       (a) changed to cyclophosphamide and doxorubicin for 3 cycles, after first cycle of TC because of elevated LFTs, completed 12/13/2015  He plans to have adjuvant anti-estrogens to follow radiation, with consideration of the PALLAS trial.   Lymphedema issues, if any:  No  Pain issues, if any:  She fell and hurt her tailbone in June, and reports some pain when standing, especially from a low seating position.   SAFETY ISSUES:  Prior radiation? No  Pacemaker/ICD? No  Possible current pregnancy? No  Is the patient on methotrexate? No  Current Complaints / other details:   GYNECOLOGIC HISTORY:  No LMP recorded. Menarche age 67,  first live birth age 24.  The patient is GX P2, although both children were born early. She is status post right salpingo-oophorectomy due to endometriosis.  She is still having regular periods  BP 100/74   Pulse 86   Temp 98.1 F (36.7 C)   Ht _0  (1.651 m)   Wt 183 lb 6.4 oz (83.2 kg)   SpO2 100% Comment: room air  BMI 30.52 kg/m    Wt Readings from Last 3 Encounters:  12/31/15 183 lb 6.4 oz (83.2 kg)  12/21/15 182 lb 6.4 oz (82.7 kg)  12/13/15 180 lb 3.2 oz (81.7 kg)      Havard Radigan, Stephani Police, RN 12/26/2015,8:20 AM

## 2015-12-27 ENCOUNTER — Other Ambulatory Visit: Payer: 59

## 2015-12-27 ENCOUNTER — Ambulatory Visit: Payer: 59

## 2015-12-28 ENCOUNTER — Ambulatory Visit
Admission: RE | Admit: 2015-12-28 | Discharge: 2015-12-28 | Disposition: A | Discharge status: Home / Self Care | Payer: 59 | Source: Ambulatory Visit | Attending: Oncology | Admitting: Oncology

## 2015-12-28 DIAGNOSIS — M8580 Other specified disorders of bone density and structure, unspecified site: Secondary | ICD-10-CM

## 2015-12-28 DIAGNOSIS — C50412 Malignant neoplasm of upper-outer quadrant of left female breast: Secondary | ICD-10-CM

## 2015-12-31 ENCOUNTER — Ambulatory Visit
Admission: RE | Admit: 2015-12-31 | Discharge: 2015-12-31 | Disposition: A | Discharge status: Home / Self Care | Payer: 59 | Source: Ambulatory Visit | Attending: Radiation Oncology | Admitting: Radiation Oncology

## 2015-12-31 ENCOUNTER — Encounter: Payer: Self-pay | Admitting: Radiation Oncology

## 2015-12-31 VITALS — BP 100/74 | HR 86 | Temp 98.1°F | Ht 65.0 in | Wt 183.4 lb

## 2015-12-31 DIAGNOSIS — Z170 Estrogen receptor positive status [ER+]: Secondary | ICD-10-CM | POA: Diagnosis not present

## 2015-12-31 DIAGNOSIS — C50412 Malignant neoplasm of upper-outer quadrant of left female breast: Secondary | ICD-10-CM

## 2015-12-31 NOTE — Progress Notes (Signed)
Radiation Oncology         [phone] ________________________________  Name: Ashley Tyler MRN: [id number]  Date: 12/31/2015  DOB: 08-27-1965  Follow-Up Visit Note  Outpatient  CC: No PCP Per Patient  [name], MD  Diagnosis:      ICD-9-CM ICD-10-CM   1. 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     She underwent left lumpectomy and SLN biopsy on 08-28-15  by Dr. [name] revealing a 2.1cm tumor with characteristics as above in the diagnosis. Final margins are negative. The SLN were negative  This was followed by adjuvant systemic chemotherapy under the care of Dr. [name]. She completed this on July 13.  She has continued working as a Marine scientist, from home, for [employer].  She denies prior RT, chance of current pregnancy or lymphedema.           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  Further testing is being performed to help determine if this PMS2 variant is pathogenic or benign.    ALLERGIES:  is allergic to tetracyclines & related and other.  Meds: Current Outpatient Prescriptions  Medication Sig Dispense Refill  . gabapentin (NEURONTIN) 100 MG capsule Take 1 capsule (100 mg total) by mouth at bedtime. 30 capsule 2  . ibuprofen (ADVIL,MOTRIN) 200 MG tablet Take 400 mg by mouth daily as needed for headache. Reported on 10/08/2015    . LORazepam (ATIVAN) 0.5 MG tablet Take 0.5 mg by mouth at bedtime as needed (Nausea/Vomiting). Take 1 tablet (0.5 mg total) by mouth at bedtime as needed (Nausea or vomiting).     No current facility-administered medications for this encounter.     Physical Findings:  height is _0  (1.651 m) and weight is 183 lb 6.4 oz (83.2 kg). Her temperature is 98.1 F (36.7 C). Her blood  pressure is 100/74 and her pulse is 86. Her oxygen saturation is 100%. .     General: Alert and oriented, in no acute distress HEENT: Head is normocephalic. alopecia.   Oropharynx is clear. Neck: Neck is supple, no palpable cervical or supraclavicular lymphadenopathy. Heart: Regular in rate and rhythm with no murmurs, rubs, or gallops. Chest: Clear to auscultation bilaterally, with no rhonchi, wheezes, or rales. Abdomen: Soft, nontender, nondistended, with no rigidity or guarding. Extremities: No cyanosis or edema. Lymphatics: see Neck Exam Musculoskeletal: symmetric strength and muscle tone throughout. Neurologic: No obvious focalities. Speech is fluent.  Psychiatric: Judgment and insight are intact. Affect is appropriate. Breast exam reveals no palpable breast or axillary masses b/l. Scars have healed.  Lab Findings: Lab Results  Component Value Date   WBC 1.6 (L) 12/20/2015   HGB 11.2 (L) 12/20/2015   HCT 33.8 (L) 12/20/2015   MCV 84.3 12/20/2015   PLT 95 (L) 12/20/2015       Radiographic Findings: Dg Bone Density  Result Date: 12/28/2015 EXAM: DUAL X-RAY ABSORPTIOMETRY (DXA) FOR BONE MINERAL DENSITY IMPRESSION: Referring Physician:  Chauncey Cruel PATIENT: Name: Ashley Tyler, Ashley Tyler Patient ID: [id number] Birth Date: 06-09-1965 Height: 65.0 in. Sex: Female Measured: 12/28/2015 Weight: 177.0 lbs. Indications: Breast Cancer History, Estrogen Deficient, Omeprazole, Postmenopausal, Secondary Osteoporosis Fractures: None Treatments: Calcium (E943.0), Vitamin D (E933.5) ASSESSMENT: The BMD measured at Femur Neck Left is 0.832 g/cm2 with a  T-score of -1.5. This patient is considered osteopenic according to [facility]) criteria. Site Region Measured Date Measured Age YA BMD Significant CHANGE T-score DualFemur Neck Left 12/28/2015    51.4         -1.5    0.832 g/cm2 AP Spine  L1-L4     12/28/2015    51.4         0.2     1.220 g/cm2 World Health Organization [facility])  criteria for post-menopausal, Caucasian Women: Normal       T-score at or above -1 SD Osteopenia   T-score between -1 and -2.5 SD Osteoporosis T-score at or below -2.5 SD RECOMMENDATION: [facility] recommends that FDA-approved medical therapies be considered in postmenopausal women and men age 22 or older with a: 1. Hip or vertebral (clinical or morphometric) fracture. 2. T-score of <-2.5 at the spine or hip. 3. Ten-year fracture probability by FRAX of 3% or greater for hip fracture or 20% or greater for major osteoporotic fracture. All treatment decisions require clinical judgment and consideration of individual patient factors, including patient preferences, co-morbidities, previous drug use, risk factors not captured in the FRAX model (e.g. falls, vitamin D deficiency, increased bone turnover, interval significant decline in bone density) and possible under - or over-estimation of fracture risk by FRAX. All patients should ensure an adequate intake of dietary calcium (1200 mg/d) and vitamin D (800 IU daily) unless contraindicated. FOLLOW-UP: People with diagnosed cases of osteoporosis or at high risk for fracture should have regular bone mineral density tests. For patients eligible for Medicare, routine testing is allowed once every 2 years. The testing frequency can be increased to one year for patients who have rapidly progressing disease, those who are receiving or discontinuing medical therapy to restore bone mass, or have additional risk factors. FRAX* 10-year Probability of Fracture Based on femoral neck BMD: DualFemur (Left) Major Osteoporotic Fracture: 2.2% Hip Fracture:                0.2% Population:                  [place] (Black) Risk Factors:                Secondary Osteoporosis *FRAX is a [place] of [employer] for Metabolic Bone Disease, a [place] (WHO) [facility]. ASSESSMENT: The probability of a major  osteoporotic fracture is 2.2 % within the next ten years. The probability of a hip fracture is 0.2 % within the next ten tears. Electronically Signed   By: Earle Gell M.D.   On: 12/28/2015 12:38   Impression/Plan: Stage II left breast cancer We discussed adjuvant radiotherapy today.  I recommend adjuvant radiotherapy to the left breast over 6 weeks in order to reduce risk of locoregional recurrence by 2/3.  The risks, benefits and side effects of this treatment were discussed in detail.  She understands that radiotherapy is associated with skin irritation and fatigue in the acute setting. Late effects can include cosmetic changes and rare injury to internal organs.   She is enthusiastic about proceeding with treatment. A consent form has been signed and placed in her chart.  Anticipate simulation in about a week; start after her vacation, which ends Aug 16th.   _____________________________________   [name], MD

## 2016-01-07 ENCOUNTER — Ambulatory Visit
Admission: RE | Admit: 2016-01-07 | Discharge: 2016-01-07 | Disposition: A | Discharge status: Home / Self Care | Payer: 59 | Source: Ambulatory Visit | Attending: Radiation Oncology | Admitting: Radiation Oncology

## 2016-01-07 DIAGNOSIS — C50412 Malignant neoplasm of upper-outer quadrant of left female breast: Secondary | ICD-10-CM | POA: Diagnosis not present

## 2016-01-07 NOTE — Progress Notes (Signed)
Radiation Oncology         [phone] ________________________________  Name: Ashley Tyler MRN: [id number]  Date: 01/07/2016  DOB: 1965-07-19  SIMULATION AND TREATMENT PLANNING NOTE  / Special treatment procedure  Outpatient  DIAGNOSIS:     ICD-9-CM ICD-10-CM   1. Breast cancer of upper-outer quadrant of left female breast ([facility]) 174.4 C50.412     NARRATIVE:  The patient was brought to the [facility].  Identity was confirmed.  All relevant records and images related to the planned course of therapy were reviewed.  The patient freely provided informed written consent to proceed with treatment after reviewing the details related to the planned course of therapy. The consent form was witnessed and verified by the simulation staff.    Then, the patient was set-up in a stable reproducible supine position for radiation therapy with her ipsilateral arm over her head, and her upper body secured in a custom-made Vac-lok device.  CT images were obtained.  Surface markings were placed.  The CT images were loaded into the planning software.    Special treatment procedure:  Special treatment procedure was performed today due to the extra time and effort required by myself to plan and prepare this patient for deep inspiration breath hold technique.  I have determined cardiac sparing to be of benefit to this patient to prevent long term cardiac damage due to radiation of the heart.  Bellows were placed on the patient's abdomen. To facilitate cardiac sparing, the patient was coached by the radiation therapists on breath hold techniques and breathing practice was performed. Practice waveforms were obtained. The patient was then scanned while maintaining breath hold in the treatment position.  This image was then transferred over to the imaging specialist. The imaging specialist then created a fusion of the free breathing and breath hold scans using the chest wall as the stable  structure. I personally reviewed the fusion in axial, coronal and sagittal image planes.  Excellent cardiac sparing was obtained.  I felt the patient is an appropriate candidate for breath hold and the patient will be treated as such.  The image fusion was then reviewed with the patient to reinforce the necessity of reproducible breath hold.   TREATMENT PLANNING NOTE: Treatment planning then occurred.  The radiation prescription was entered and confirmed.     A total of 3 medically necessary complex treatment devices were fabricated and supervised by me: 2 fields with MLCs for custom blocks to protect heart, and lungs;  and, a Vac-lok. MORE COMPLEX DEVICES MAY BE MADE IN DOSIMETRY FOR FIELD IN FIELD BEAMS FOR DOSE HOMOGENEITY.  I have requested : 3D Simulation  I have requested a DVH of the following structures: lungs, heart, lumpectomy cavity.    The patient will receive 50 Gy in 25 fractions to the left breast with 2 tangential fields.  This will  be followed by a boost.  Optical Surface Tracking Plan:  Since intensity modulated radiotherapy (IMRT) and 3D conformal radiation treatment methods are predicated on accurate and precise positioning for treatment, intrafraction motion monitoring is medically necessary to ensure accurate and safe treatment delivery. The ability to quantify intrafraction motion without excessive ionizing radiation dose can only be performed with optical surface tracking. Accordingly, surface imaging offers the opportunity to obtain 3D measurements of patient position throughout IMRT and 3D treatments without excessive radiation exposure. I am ordering optical surface tracking for this patient's upcoming course of radiotherapy.  ________________________________   Reference:  Rolan Lipa,  Tye Savoy, J, et al. Surface imaging-based analysis of intrafraction motion for breast radiotherapy patients.Journal of [facility], n. 6, nov. 2014. ISSN  [id number].  Available at: <http://www.jacmp.org/index.php/jacmp/article/view/4957>.    -----------------------------------  [name], MD

## 2016-01-08 DIAGNOSIS — C50412 Malignant neoplasm of upper-outer quadrant of left female breast: Secondary | ICD-10-CM | POA: Diagnosis not present

## 2016-01-11 ENCOUNTER — Telehealth: Payer: Self-pay | Admitting: Genetic Counselor

## 2016-01-11 NOTE — Telephone Encounter (Signed)
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  Deletions in the pseudogene region are not clinically relevant.  Let patient know that her report has been updated to report a negative genetic test.

## 2016-01-14 ENCOUNTER — Ambulatory Visit: Payer: 59 | Admitting: Radiation Oncology

## 2016-01-15 ENCOUNTER — Ambulatory Visit: Payer: 59

## 2016-01-16 ENCOUNTER — Ambulatory Visit: Payer: 59

## 2016-01-17 ENCOUNTER — Encounter: Payer: Self-pay | Admitting: *Deleted

## 2016-01-17 ENCOUNTER — Ambulatory Visit
Admission: RE | Admit: 2016-01-17 | Discharge: 2016-01-17 | Disposition: A | Discharge status: Home / Self Care | Payer: 59 | Source: Ambulatory Visit | Attending: Radiation Oncology | Admitting: Radiation Oncology

## 2016-01-17 ENCOUNTER — Ambulatory Visit: Payer: 59

## 2016-01-17 DIAGNOSIS — C50412 Malignant neoplasm of upper-outer quadrant of left female breast: Secondary | ICD-10-CM | POA: Insufficient documentation

## 2016-01-17 MED ORDER — ALRA NON-METALLIC DEODORANT (RAD-ONC)
1.0000 "application " | Freq: Once | TOPICAL | Status: AC
  Administered 2016-01-17: 1 via TOPICAL

## 2016-01-17 MED ORDER — RADIAPLEXRX EX GEL
Freq: Once | CUTANEOUS | Status: AC
  Administered 2016-01-17: 14:00:00 via TOPICAL

## 2016-01-17 NOTE — Progress Notes (Signed)

## 2016-01-17 NOTE — Progress Notes (Signed)
[facility] Psychosocial Distress Screening Clinical Social Work  Clinical Social Work was referred by distress screening protocol.  The patient scored a 8 on the Psychosocial Distress Thermometer which indicates severe distress. Clinical Social Worker phoned pt to assess for distress and other psychosocial needs. CSW left vm for pt to return CSW and also completed referral to financial counselor as pt's concern was financial in nature in the past. CSW awaits return call.   ONCBCN DISTRESS SCREENING 12/31/2015  Screening Type Initial Screening  Distress experienced in past week (1-10) 8  Emotional problem type   Information Concerns Type (No Data)    Clinical Social Worker follow up needed: Yes.    If yes, follow up plan: See above Loren Racer, [place] Worker [place]  [facility] Phone: [phone] Fax: [phone]

## 2016-01-18 ENCOUNTER — Ambulatory Visit: Payer: 59

## 2016-01-21 ENCOUNTER — Ambulatory Visit
Admission: RE | Admit: 2016-01-21 | Discharge: 2016-01-21 | Disposition: A | Discharge status: Home / Self Care | Payer: 59 | Source: Ambulatory Visit | Attending: Radiation Oncology | Admitting: Radiation Oncology

## 2016-01-21 ENCOUNTER — Encounter: Payer: Self-pay | Admitting: Radiation Oncology

## 2016-01-21 VITALS — BP 113/83 | HR 75 | Temp 98.4°F | Ht 65.0 in | Wt 181.0 lb

## 2016-01-21 DIAGNOSIS — C50412 Malignant neoplasm of upper-outer quadrant of left female breast: Secondary | ICD-10-CM | POA: Diagnosis not present

## 2016-01-21 NOTE — Progress Notes (Signed)
   Weekly Management Note:  outpatient    ICD-9-CM ICD-10-CM   1. Breast cancer of upper-outer quadrant of left female breast ([facility]) 174.4 C50.412     Current Dose:  2 Gy  Projected Dose: 60 Gy   Narrative:  The patient presents for routine under treatment assessment.  CBCT/MVCT images/Port film x-rays were reviewed.  The chart was checked. Doing well.  Physical Findings:  height is 5\' 5"  (1.651 m) and weight is 181 lb (82.1 kg). Her temperature is 98.4 F (36.9 C). Her blood pressure is 113/83 and her pulse is 75.   Wt Readings from Last 3 Encounters:  01/21/16 181 lb (82.1 kg)  12/31/15 183 lb 6.4 oz (83.2 kg)  12/21/15 182 lb 6.4 oz (82.7 kg)   No skin irritation  Impression:  The patient is tolerating radiotherapy.  Plan:  Continue radiotherapy as planned. Patient instructed to apply Radiplex to intact skin in treatment fields.     ________________________________   [name], M.D.

## 2016-01-21 NOTE — Progress Notes (Signed)
Ashley Tyler is here for her 1st fraction of radiation to her Left Breast. She denies pain or fatigue. The skin to her radiation site is normal appearing, and she is using the radiaplex as directed.  BP 113/83   Pulse 75   Temp 98.4 F (36.9 C)   Ht 5\' 5"  (1.651 m)   Wt 181 lb (82.1 kg)   BMI 30.12 kg/m

## 2016-01-22 ENCOUNTER — Ambulatory Visit
Admission: RE | Admit: 2016-01-22 | Discharge: 2016-01-22 | Disposition: A | Discharge status: Home / Self Care | Payer: 59 | Source: Ambulatory Visit | Attending: Radiation Oncology | Admitting: Radiation Oncology

## 2016-01-22 DIAGNOSIS — C50412 Malignant neoplasm of upper-outer quadrant of left female breast: Secondary | ICD-10-CM | POA: Diagnosis not present

## 2016-01-23 ENCOUNTER — Ambulatory Visit
Admission: RE | Admit: 2016-01-23 | Discharge: 2016-01-23 | Disposition: A | Discharge status: Home / Self Care | Payer: 59 | Source: Ambulatory Visit | Attending: Radiation Oncology | Admitting: Radiation Oncology

## 2016-01-23 DIAGNOSIS — C50412 Malignant neoplasm of upper-outer quadrant of left female breast: Secondary | ICD-10-CM | POA: Diagnosis not present

## 2016-01-24 ENCOUNTER — Ambulatory Visit
Admission: RE | Admit: 2016-01-24 | Discharge: 2016-01-24 | Disposition: A | Discharge status: Home / Self Care | Payer: 59 | Source: Ambulatory Visit | Attending: Radiation Oncology | Admitting: Radiation Oncology

## 2016-01-24 DIAGNOSIS — C50412 Malignant neoplasm of upper-outer quadrant of left female breast: Secondary | ICD-10-CM | POA: Diagnosis not present

## 2016-01-25 ENCOUNTER — Ambulatory Visit
Admission: RE | Admit: 2016-01-25 | Discharge: 2016-01-25 | Disposition: A | Discharge status: Home / Self Care | Payer: 59 | Source: Ambulatory Visit | Attending: Radiation Oncology | Admitting: Radiation Oncology

## 2016-01-25 DIAGNOSIS — C50412 Malignant neoplasm of upper-outer quadrant of left female breast: Secondary | ICD-10-CM | POA: Diagnosis not present

## 2016-01-28 ENCOUNTER — Encounter: Payer: Self-pay | Admitting: Radiation Oncology

## 2016-01-28 ENCOUNTER — Ambulatory Visit
Admission: RE | Admit: 2016-01-28 | Discharge: 2016-01-28 | Disposition: A | Discharge status: Home / Self Care | Payer: 59 | Source: Ambulatory Visit | Attending: Radiation Oncology | Admitting: Radiation Oncology

## 2016-01-28 VITALS — BP 106/72 | HR 76 | Temp 98.1°F | Ht 65.0 in | Wt 180.1 lb

## 2016-01-28 DIAGNOSIS — C50412 Malignant neoplasm of upper-outer quadrant of left female breast: Secondary | ICD-10-CM | POA: Diagnosis not present

## 2016-01-28 NOTE — Progress Notes (Signed)
Ashley Tyler has received 6 fractions to her left breast.  Note mild hyperpigmentation with intact skin.  She reports that she has intermittent shooting pains.  Denies fatigue.

## 2016-01-28 NOTE — Progress Notes (Signed)
   Weekly Management Note:  outpatient    ICD-9-CM ICD-10-CM   1. Breast cancer of upper-outer quadrant of left female breast ([facility]) 174.4 C50.412     Current Dose:  12 Gy  Projected Dose: 60 Gy   Narrative:  The patient presents for routine under treatment assessment.  CBCT/MVCT images/Port film x-rays were reviewed.  The chart was checked. Doing well.  Physical Findings:  height is 5\' 5"  (1.651 m) and weight is 180 lb 1.6 oz (81.7 kg). Her temperature is 98.1 F (36.7 C). Her blood pressure is 106/72 and her pulse is 76.   Wt Readings from Last 3 Encounters:  01/28/16 180 lb 1.6 oz (81.7 kg)  01/21/16 181 lb (82.1 kg)  12/31/15 183 lb 6.4 oz (83.2 kg)  slight left breast hyperpigmentation  Impression:  The patient is tolerating radiotherapy.  Plan:  Continue radiotherapy as planned. Patient instructed to apply Radiplex to intact skin in treatment fields.     ________________________________   [name], M.D.

## 2016-01-29 ENCOUNTER — Ambulatory Visit
Admission: RE | Admit: 2016-01-29 | Discharge: 2016-01-29 | Disposition: A | Discharge status: Home / Self Care | Payer: 59 | Source: Ambulatory Visit | Attending: Radiation Oncology | Admitting: Radiation Oncology

## 2016-01-29 ENCOUNTER — Telehealth: Payer: Self-pay | Admitting: *Deleted

## 2016-01-29 DIAGNOSIS — C50412 Malignant neoplasm of upper-outer quadrant of left female breast: Secondary | ICD-10-CM | POA: Diagnosis not present

## 2016-01-29 NOTE — Telephone Encounter (Signed)
  Oncology Nurse Navigator Documentation    Navigator Encounter Type: Telephone (Relate doing well and without complaints) (01/29/16 1000) Telephone: Lahoma Crocker Call (Encourage pt to call with needs) (01/29/16 1000)         Patient Visit Type: [id number] (01/29/16 1000) Treatment Phase: First Radiation Tx (01/29/16 1000)                            Time Spent with Patient: 15 (01/29/16 1000)

## 2016-01-30 ENCOUNTER — Ambulatory Visit
Admission: RE | Admit: 2016-01-30 | Discharge: 2016-01-30 | Disposition: A | Discharge status: Home / Self Care | Payer: 59 | Source: Ambulatory Visit | Attending: Radiation Oncology | Admitting: Radiation Oncology

## 2016-01-30 ENCOUNTER — Telehealth: Payer: Self-pay

## 2016-01-30 DIAGNOSIS — C50412 Malignant neoplasm of upper-outer quadrant of left female breast: Secondary | ICD-10-CM | POA: Diagnosis not present

## 2016-01-30 NOTE — Telephone Encounter (Signed)
I called to inform Ashley Tyler of her exposure to the Flu during her radiation treatment. I offered to call in a prescription for Tamiflu to her pharmacy and she agreed. A prescription for Tamiflu was called into the [facility] on [address].

## 2016-01-31 ENCOUNTER — Ambulatory Visit
Admission: RE | Admit: 2016-01-31 | Discharge: 2016-01-31 | Disposition: A | Discharge status: Home / Self Care | Payer: 59 | Source: Ambulatory Visit | Attending: Radiation Oncology | Admitting: Radiation Oncology

## 2016-01-31 DIAGNOSIS — C50412 Malignant neoplasm of upper-outer quadrant of left female breast: Secondary | ICD-10-CM | POA: Diagnosis not present

## 2016-02-01 ENCOUNTER — Ambulatory Visit
Admission: RE | Admit: 2016-02-01 | Discharge: 2016-02-01 | Disposition: A | Discharge status: Home / Self Care | Payer: 59 | Source: Ambulatory Visit | Attending: Radiation Oncology | Admitting: Radiation Oncology

## 2016-02-01 DIAGNOSIS — C50412 Malignant neoplasm of upper-outer quadrant of left female breast: Secondary | ICD-10-CM | POA: Diagnosis not present

## 2016-02-05 ENCOUNTER — Ambulatory Visit
Admission: RE | Admit: 2016-02-05 | Discharge: 2016-02-05 | Disposition: A | Discharge status: Home / Self Care | Payer: 59 | Source: Ambulatory Visit | Attending: Radiation Oncology | Admitting: Radiation Oncology

## 2016-02-05 DIAGNOSIS — C50412 Malignant neoplasm of upper-outer quadrant of left female breast: Secondary | ICD-10-CM | POA: Diagnosis not present

## 2016-02-06 ENCOUNTER — Ambulatory Visit
Admission: RE | Admit: 2016-02-06 | Discharge: 2016-02-06 | Disposition: A | Discharge status: Home / Self Care | Payer: 59 | Source: Ambulatory Visit | Attending: Radiation Oncology | Admitting: Radiation Oncology

## 2016-02-06 ENCOUNTER — Encounter: Payer: Self-pay | Admitting: Radiation Oncology

## 2016-02-06 VITALS — BP 131/73 | HR 76 | Temp 98.4°F | Ht 65.0 in | Wt 180.1 lb

## 2016-02-06 DIAGNOSIS — C50412 Malignant neoplasm of upper-outer quadrant of left female breast: Secondary | ICD-10-CM

## 2016-02-06 MED ORDER — RADIAPLEXRX EX GEL
Freq: Once | CUTANEOUS | Status: AC
  Administered 2016-02-06: 10:00:00 via TOPICAL

## 2016-02-06 MED ORDER — RADIAPLEXRX EX GEL: Freq: Once | CUTANEOUS | Status: DC

## 2016-02-06 NOTE — Progress Notes (Signed)
  Radiation Oncology         [phone] ________________________________  Name: Ashley Tyler MRN: [id number]  Date: 02/06/2016  DOB: 07-09-1965  Weekly Radiation Therapy Management    ICD-9-CM ICD-10-CM   1. Breast cancer of upper-outer quadrant of left female breast ([facility]) 174.4 C50.412      Current Dose: 26 Gy     Planned Dose:  60 Gy  Narrative . . . . . . . . The patient presents for routine under treatment assessment.                               She reports occassional shooting pain, but no overt pain.  Mild fatigue. Completed course of Tamiflu yesterday, but reports that she is coughing up green sputum. No reports of chills or fever at home                                 Set-up films were reviewed.                                 The chart was checked. Physical Findings. . .  Vitals with BMI 02/06/2016  Height 5\' 5"   Weight 180 lbs 2 oz  BMI 30  Systolic [id number]  Diastolic 73  Pulse 76  Respirations   Temp 98.4. Weight essentially stable. Lungs are clear to auscultation bilaterally. Heart has regular rate and rhythm. Left breast shows some mild hyperpigmentation changes. No skin breakdown. Impression . . . . . . . The patient is tolerating radiation. Plan . . . . . . . . . . . . Continue treatment as planned. The patient will monitor her cough and check her temperature  and let us know if this becomes a continued issue. ________________________________   Blair Promise, PhD, MD  This document serves as a record of services personally performed by Gery Pray, MD. It was created on his behalf by Darcus Austin, a trained medical scribe. The creation of this record is based on the scribe's personal observations and the provider's statements to them. This document has been checked and approved by the attending provider.

## 2016-02-06 NOTE — Progress Notes (Signed)
Ashley Tyler has received 12 fractions to her left breast.  She reports occassional shooting pain, but no overt pain.  Mild fatigue. Skin on breast with hyperpigmentation,skin soft and intact without dryness. Completed course of Tamiflu yesterday, but reports that she is "coughing up green sputum.  Temp 98.4 today.    BP 131/73 (BP Location: Right Arm, Patient Position: Sitting, Cuff Size: Normal)   Pulse 76   Temp 98.4 F (36.9 C) (Oral)   Ht 5\' 5"  (1.651 m)   Wt 180 lb 1.6 oz (81.7 kg)   BMI 29.97 kg/m     Wt Readings from Last 3 Encounters:  02/06/16 180 lb 1.6 oz (81.7 kg)  01/28/16 180 lb 1.6 oz (81.7 kg)  01/21/16 181 lb (82.1 kg)

## 2016-02-06 NOTE — Addendum Note (Signed)
Encounter addended by: Benn Moulder, RN on: 02/06/2016 10:02 AM<BR>    Actions taken: Order Entry activity accessed, Diagnosis association updated

## 2016-02-06 NOTE — Addendum Note (Signed)
Encounter addended by: Benn Moulder, RN on: 02/06/2016 10:12 AM<BR>    Actions taken: [facility] administration accepted

## 2016-02-07 ENCOUNTER — Ambulatory Visit
Admission: RE | Admit: 2016-02-07 | Discharge: 2016-02-07 | Disposition: A | Discharge status: Home / Self Care | Payer: 59 | Source: Ambulatory Visit | Attending: Radiation Oncology | Admitting: Radiation Oncology

## 2016-02-07 DIAGNOSIS — C50412 Malignant neoplasm of upper-outer quadrant of left female breast: Secondary | ICD-10-CM | POA: Diagnosis not present

## 2016-02-08 ENCOUNTER — Ambulatory Visit
Admission: RE | Admit: 2016-02-08 | Discharge: 2016-02-08 | Disposition: A | Discharge status: Home / Self Care | Payer: 59 | Source: Ambulatory Visit | Attending: Radiation Oncology | Admitting: Radiation Oncology

## 2016-02-08 ENCOUNTER — Encounter: Payer: Self-pay | Admitting: Radiation Oncology

## 2016-02-08 DIAGNOSIS — C50412 Malignant neoplasm of upper-outer quadrant of left female breast: Secondary | ICD-10-CM | POA: Diagnosis not present

## 2016-02-09 ENCOUNTER — Ambulatory Visit: Payer: 59

## 2016-02-11 ENCOUNTER — Ambulatory Visit
Admission: RE | Admit: 2016-02-11 | Discharge: 2016-02-11 | Disposition: A | Discharge status: Home / Self Care | Payer: 59 | Source: Ambulatory Visit | Attending: Radiation Oncology | Admitting: Radiation Oncology

## 2016-02-11 DIAGNOSIS — C50412 Malignant neoplasm of upper-outer quadrant of left female breast: Secondary | ICD-10-CM | POA: Diagnosis not present

## 2016-02-12 ENCOUNTER — Ambulatory Visit
Admission: RE | Admit: 2016-02-12 | Discharge: 2016-02-12 | Disposition: A | Discharge status: Home / Self Care | Payer: 59 | Source: Ambulatory Visit | Attending: Radiation Oncology | Admitting: Radiation Oncology

## 2016-02-12 DIAGNOSIS — C50412 Malignant neoplasm of upper-outer quadrant of left female breast: Secondary | ICD-10-CM | POA: Diagnosis not present

## 2016-02-13 ENCOUNTER — Ambulatory Visit
Admission: RE | Admit: 2016-02-13 | Discharge: 2016-02-13 | Disposition: A | Discharge status: Home / Self Care | Payer: 59 | Source: Ambulatory Visit | Attending: Radiation Oncology | Admitting: Radiation Oncology

## 2016-02-13 ENCOUNTER — Encounter: Payer: Self-pay | Admitting: Radiation Oncology

## 2016-02-13 VITALS — BP 123/74 | HR 68 | Temp 98.0°F | Ht 65.0 in | Wt 181.9 lb

## 2016-02-13 DIAGNOSIS — C50412 Malignant neoplasm of upper-outer quadrant of left female breast: Secondary | ICD-10-CM

## 2016-02-13 NOTE — Progress Notes (Signed)
Ashley Tyler is here for her 17th fraction of radiation to her left Breast. She denies pain at this time, but does report occasional sharp pain in her Left Breast. Her Left Breast is hyperpigmented, and she is using the Radiaplex twice daily as directed.   BP 123/74   Pulse 68   Temp 98 F (36.7 C)   Ht 5\' 5"  (1.651 m)   Wt 181 lb 14.4 oz (82.5 kg)   BMI 30.27 kg/m

## 2016-02-13 NOTE — Progress Notes (Signed)
   Weekly Management Note:  outpatient    ICD-9-CM ICD-10-CM   1. Breast cancer of upper-outer quadrant of left female breast ([facility]) 174.4 C50.412     Current Dose:  34 Gy  Projected Dose: 60 Gy   Narrative:  The patient presents for routine under treatment assessment.  CBCT/MVCT images/Port film x-rays were reviewed.  The chart was checked. Doing well.  Ms. Ashley Tyler is here for her 17th fraction of radiation to her left breast. She denies pain at this time, but does report occasional sharp pain in her left breast. Her left breast is hyperpigmented, and she is using Radiaplex twice daily as directed.  Physical Findings:  height is 5\' 5"  (1.651 m) and weight is 181 lb 14.4 oz (82.5 kg). Her temperature is 98 F (36.7 C). Her blood pressure is 123/74 and her pulse is 68.   Wt Readings from Last 3 Encounters:  02/13/16 181 lb 14.4 oz (82.5 kg)  02/06/16 180 lb 1.6 oz (81.7 kg)  01/28/16 180 lb 1.6 oz (81.7 kg)  Patient has hyperpigmentation over the left breast. Skin is intact.  Impression:  The patient is tolerating radiotherapy.  Plan:  Continue radiotherapy as planned.     ________________________________   [name], M.D.    This document serves as a record of services personally performed by [name], MD. It was created on her behalf by Bethann Humble, a trained medical scribe. The creation of this record is based on the scribe's personal observations and the provider's statements to them. This document has been checked and approved by the attending provider.

## 2016-02-14 ENCOUNTER — Ambulatory Visit
Admission: RE | Admit: 2016-02-14 | Discharge: 2016-02-14 | Disposition: A | Discharge status: Home / Self Care | Payer: 59 | Source: Ambulatory Visit | Attending: Radiation Oncology | Admitting: Radiation Oncology

## 2016-02-14 DIAGNOSIS — C50412 Malignant neoplasm of upper-outer quadrant of left female breast: Secondary | ICD-10-CM | POA: Diagnosis not present

## 2016-02-15 ENCOUNTER — Encounter: Payer: Self-pay | Admitting: Radiation Oncology

## 2016-02-15 ENCOUNTER — Ambulatory Visit
Admission: RE | Admit: 2016-02-15 | Discharge: 2016-02-15 | Disposition: A | Discharge status: Home / Self Care | Payer: 59 | Source: Ambulatory Visit | Attending: Radiation Oncology | Admitting: Radiation Oncology

## 2016-02-15 DIAGNOSIS — C50412 Malignant neoplasm of upper-outer quadrant of left female breast: Secondary | ICD-10-CM | POA: Diagnosis not present

## 2016-02-18 ENCOUNTER — Encounter: Payer: Self-pay | Admitting: Radiation Oncology

## 2016-02-18 ENCOUNTER — Ambulatory Visit
Admission: RE | Admit: 2016-02-18 | Discharge: 2016-02-18 | Disposition: A | Discharge status: Home / Self Care | Payer: 59 | Source: Ambulatory Visit | Attending: Radiation Oncology | Admitting: Radiation Oncology

## 2016-02-18 VITALS — BP 117/82 | HR 71 | Temp 98.1°F | Resp 20 | Wt 179.5 lb

## 2016-02-18 DIAGNOSIS — C50412 Malignant neoplasm of upper-outer quadrant of left female breast: Secondary | ICD-10-CM | POA: Diagnosis not present

## 2016-02-18 NOTE — Progress Notes (Signed)
   Weekly Management Note:  outpatient    ICD-9-CM ICD-10-CM   1. Breast cancer of upper-outer quadrant of left female breast (HCC) 174.4 C50.412     Current Dose:  40  Gy  Projected Dose: 60 Gy   Narrative:  The patient presents for routine under treatment assessment.  CBCT/MVCT images/Port film x-rays were reviewed.  The chart was checked. Doing well.  No new issues  Physical Findings:  weight is 179 lb 8 oz (81.4 kg). Her oral temperature is 98.1 F (36.7 C). Her blood pressure is 117/82 and her pulse is 71. Her respiration is 20.   Wt Readings from Last 3 Encounters:  02/18/16 179 lb 8 oz (81.4 kg)  02/13/16 181 lb 14.4 oz (82.5 kg)  02/06/16 180 lb 1.6 oz (81.7 kg)  Patient has hyperpigmentation over the left breast. Skin is intact.  Impression:  The patient is tolerating radiotherapy.  Plan:  Continue radiotherapy as planned.     ________________________________   [name], M.D.    This document serves as a record of services personally performed by [name], MD. It was created on her behalf by Bethann Humble, a trained medical scribe. The creation of this record is based on the scribe's personal observations and the provider's statements to them. This document has been checked and approved by the attending provider.

## 2016-02-19 ENCOUNTER — Ambulatory Visit
Admission: RE | Admit: 2016-02-19 | Discharge: 2016-02-19 | Disposition: A | Discharge status: Home / Self Care | Payer: 59 | Source: Ambulatory Visit | Attending: Radiation Oncology | Admitting: Radiation Oncology

## 2016-02-19 DIAGNOSIS — C50412 Malignant neoplasm of upper-outer quadrant of left female breast: Secondary | ICD-10-CM | POA: Diagnosis not present

## 2016-02-20 ENCOUNTER — Ambulatory Visit
Admission: RE | Admit: 2016-02-20 | Discharge: 2016-02-20 | Disposition: A | Discharge status: Home / Self Care | Payer: 59 | Source: Ambulatory Visit | Attending: Radiation Oncology | Admitting: Radiation Oncology

## 2016-02-20 DIAGNOSIS — C50412 Malignant neoplasm of upper-outer quadrant of left female breast: Secondary | ICD-10-CM | POA: Diagnosis not present

## 2016-02-21 ENCOUNTER — Ambulatory Visit (HOSPITAL_BASED_OUTPATIENT_CLINIC_OR_DEPARTMENT_OTHER): Payer: 59 | Admitting: Oncology

## 2016-02-21 ENCOUNTER — Ambulatory Visit
Admission: RE | Admit: 2016-02-21 | Discharge: 2016-02-21 | Disposition: A | Discharge status: Home / Self Care | Payer: 59 | Source: Ambulatory Visit | Attending: Radiation Oncology | Admitting: Radiation Oncology

## 2016-02-21 ENCOUNTER — Other Ambulatory Visit (HOSPITAL_BASED_OUTPATIENT_CLINIC_OR_DEPARTMENT_OTHER): Payer: 59

## 2016-02-21 VITALS — BP 118/79 | HR 72 | Temp 98.8°F | Resp 18 | Ht 65.0 in | Wt 180.1 lb

## 2016-02-21 DIAGNOSIS — N951 Menopausal and female climacteric states: Secondary | ICD-10-CM | POA: Diagnosis not present

## 2016-02-21 DIAGNOSIS — R5383 Other fatigue: Secondary | ICD-10-CM | POA: Diagnosis not present

## 2016-02-21 DIAGNOSIS — Z170 Estrogen receptor positive status [ER+]: Secondary | ICD-10-CM

## 2016-02-21 DIAGNOSIS — C50412 Malignant neoplasm of upper-outer quadrant of left female breast: Secondary | ICD-10-CM | POA: Diagnosis not present

## 2016-02-21 LAB — CBC WITH DIFFERENTIAL/PLATELET
BASO%: 0.5 % (ref 0.0–2.0)
BASOS ABS: 0 10*3/uL (ref 0.0–0.1)
EOS ABS: 0.1 10*3/uL (ref 0.0–0.5)
EOS%: 1.6 % (ref 0.0–7.0)
HCT: 40.5 % (ref 34.8–46.6)
HEMOGLOBIN: 13 g/dL (ref 11.6–15.9)
LYMPH%: 22.7 % (ref 14.0–49.7)
MCH: 28.2 pg (ref 25.1–34.0)
MCHC: 32.2 g/dL (ref 31.5–36.0)
MCV: 87.7 fL (ref 79.5–101.0)
MONO#: 0.2 10*3/uL (ref 0.1–0.9)
MONO%: 5.6 % (ref 0.0–14.0)
NEUT#: 2.9 10*3/uL (ref 1.5–6.5)
NEUT%: 69.6 % (ref 38.4–76.8)
PLATELETS: 233 10*3/uL (ref 145–400)
RBC: 4.62 10*6/uL (ref 3.70–5.45)
RDW: 13.6 % (ref 11.2–14.5)
WBC: 4.1 10*3/uL (ref 3.9–10.3)
lymph#: 0.9 10*3/uL (ref 0.9–3.3)

## 2016-02-21 LAB — COMPREHENSIVE METABOLIC PANEL
ALBUMIN: 3.8 g/dL (ref 3.5–5.0)
ALT: 29 U/L (ref 0–55)
ANION GAP: 9 meq/L (ref 3–11)
AST: 25 U/L (ref 5–34)
Alkaline Phosphatase: 68 U/L (ref 40–150)
BUN: 11.2 mg/dL (ref 7.0–26.0)
CO2: 25 mEq/L (ref 22–29)
Calcium: 9.4 mg/dL (ref 8.4–10.4)
Chloride: 108 mEq/L (ref 98–109)
Creatinine: 0.9 mg/dL (ref 0.6–1.1)
EGFR: 88 mL/min/{1.73_m2} — AB (ref >90)
GLUCOSE: 87 mg/dL (ref 70–140)
POTASSIUM: 3.9 meq/L (ref 3.5–5.1)
SODIUM: 142 meq/L (ref 136–145)
Total Bilirubin: 0.37 mg/dL (ref 0.20–1.20)
Total Protein: 7.2 g/dL (ref 6.4–8.3)

## 2016-02-21 MED ORDER — GABAPENTIN 300 MG PO CAPS: 300.0000 mg | ORAL_CAPSULE | Freq: Every day | ORAL | 4 refills | Status: DC

## 2016-02-21 MED ORDER — LORAZEPAM 0.5 MG PO TABS: 0.5000 mg | ORAL_TABLET | Freq: Every evening | ORAL | 0 refills | Status: DC | PRN

## 2016-02-21 MED ORDER — VENLAFAXINE HCL ER 75 MG PO CP24: 75.0000 mg | ORAL_CAPSULE | Freq: Every day | ORAL | 4 refills | Status: DC

## 2016-02-21 NOTE — Progress Notes (Signed)
Ashley Tyler  [phone] [phone]   ID: Ashley Tyler DOB: 10/27/1965  MR#: [id number]  BTD#:[id number]  Patient Care Team: No Pcp Per Patient as PCP - General (General Practice) Ashley Cruel, MD as Consulting Physician (Oncology) Ashley Seltzer, MD as Consulting Physician (General Surgery) Ashley Gibson, MD as Attending Physician (Radiation Oncology) PCP: No PCP Per Patient GYN: OTHER MD:  CHIEF COMPLAINT: Estrogen receptor positive breast cancer  CURRENT TREATMENT: Adjuvant radiation pending  BREAST CANCER HISTORY: From the original intake note:  Ashley Tyler had screening mammography at Dr. Caralee Tyler office 07/24/2015 suggesting a left breast mass and the patient was referred to the [facility] where on 07/30/2015 she underwent left diagnostic mammography with tomosynthesis and left breast ultrasonography. The breast density was category C. In the upper outer quadrant of the left breast there was a 2.3 cm spiculated mass. There were no associated calcifications. This mass was palpable at the 1:00 position 5 cm from the nipple. Ultrasound confirmed an irregular hypoechoic mass measuring 1.8 cm. Ultrasonography of the left axilla was negative.  On 08/03/2015 the patient underwent biopsy of the left breast mass in question. 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  The patient then met with surgery and after appropriate discussion she proceeded to left lumpectomy and sentinel lymph node sampling 08/28/2015. The final pathology ([id number]) showed invasive ductal carcinoma, grade 3, measuring 2.1 cm. Margins were close but negative. All 3 sentinel lymph nodes were clear. Repeat HER-2 was again negative,  with a signals ratio of 0.9, and number per cell 2.20.  INTERVAL HISTORY: Ashley Tyler returns today for follow-up of her estrogen receptor positive breast cancer. She is currently receiving adjuvant radiation. She is generally tolerating that well, with fatigue as her major side effect. Despite that she is working full time: "I'm a single mother and I have to do it". She is having some redness in the breast but no pain and no peeling so far.  REVIEW OF SYSTEMS: Ashley Tyler has a redness hot flashes. He keep her up at night and distract her during the day. She is also very Warehouse manager. She cries easily. She is sleeping poorly. She has some ear irritation. Her hair is very slow to come in and this is irritating. Aside from these issues a detailed review of systems today was stable  PAST MEDICAL HISTORY: Past Medical History:  Diagnosis Date  . Cancer ([place])    left breast  . Chronic constipation   . Endometriosis   . Family history of breast cancer   . Family history of prostate cancer   . Headache   . Migraines     PAST SURGICAL HISTORY: Past Surgical History:  Procedure Laterality Date  . BREAST LUMPECTOMY WITH RADIOACTIVE SEED AND SENTINEL LYMPH NODE BIOPSY Left 08/28/2015   Procedure: LEFT BREAST LUMPECTOMY WITH RADIOACTIVE SEED AND SENTINEL LYMPH NODE BIOPSY;  Surgeon: Ashley Seltzer, MD;  Location: [facility];  Service: General;  Laterality: Left;  . CESAREAN SECTION    . LAPAROSCOPIC SALPINGO OOPHERECTOMY Right   . TONSILLECTOMY      FAMILY HISTORY Family History  Problem Relation Age of Onset  . Prostate cancer Father 27    Gleason = 7  . Prostate cancer Maternal Uncle     dx  in his 24s  . Breast cancer Paternal Aunt     dx in her 6s  . Prostate cancer Paternal Uncle   . Alzheimer's disease Maternal Grandmother   . Lung cancer Maternal Grandfather   . Congestive Heart Failure Paternal Grandmother   . Prostate cancer Maternal Uncle     dx in his 36s  . Breast  cancer Paternal Aunt     dx in her 63s; maternal half paternal aunt  . Prostate cancer Paternal Uncle     dx in his 57s  . Breast cancer Cousin   . Breast cancer Cousin     father's maternal cousin's daughter; dx in her 31s-30s  The patient's parents are still living, in their early 56s as of April 2016. The patient is an only child. On her father's side there are 4 cases of breast cancer, 2 aunts diagnosed in their 75s and 41s, and 2 cousins diagnosed in her 57s and one at age 56. The patient's father had prostate cancer diagnosed age 4.  GYNECOLOGIC HISTORY:  No LMP recorded. Menarche age 32, first live birth age 1. The patient is GX P2, although both children were born early. She is status post right salpingo-oophorectomy due to endometriosis. 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 At home is just she and her daughter Ashley Tyler, 56. Her other daughter, Ashley Tyler, is studying criminal justice at [employer]. Her SO is this is with. Ashley Tyler    ADVANCED DIRECTIVES: Not in place   HEALTH MAINTENANCE: Social History  Substance Use Topics  . Smoking status: Never Smoker  . Smokeless tobacco: Not on file  . Alcohol use Yes     Comment: social     Colonoscopy:  PAP:  Bone density:  Lipid panel:  Allergies  Allergen Reactions  . Tetracyclines & Related Itching  . Other Rash    Powder in gloves causes rash    Current Outpatient Prescriptions  Medication Sig Dispense Refill  . gabapentin (NEURONTIN) 300 MG capsule Take 1 capsule (300 mg total) by mouth at bedtime. 90 capsule 4  . hyaluronate sodium (RADIAPLEXRX) GEL Apply 1 application topically once.    [name] ibuprofen (ADVIL,MOTRIN) 200 MG tablet Take 400 mg by mouth daily as needed for headache. Reported on 10/08/2015    . LORazepam (ATIVAN) 0.5 MG tablet Take 1 tablet (0.5 mg total) by mouth at  bedtime as needed for anxiety. 30 tablet 0  . non-metallic deodorant (ALRA) MISC Apply 1 application topically daily as needed.    . venlafaxine XR (EFFEXOR-XR) 75 MG 24 hr capsule Take 1 capsule (75 mg total) by mouth daily with breakfast. 90 capsule 4   No current facility-administered medications for this visit.     OBJECTIVE: Middle-aged African-American woman  Vitals:   02/21/16 1501  BP: 118/79  Pulse: 72  Resp: 18  Temp: 98.8 F (37.1 C)     Body mass index is 29.97 kg/m.    ECOG FS:1 - Symptomatic but completely ambulatory  Sclerae unicteric, pupils round and equal Oropharynx clear and moist-- no thrush or other lesions No cervical or supraclavicular adenopathy Lungs no rales or rhonchi Heart regular rate and rhythm Abd soft, nontender, positive bowel sounds MSK no focal spinal tenderness, no upper extremity lymphedema Neuro: nonfocal, well oriented, Tearful affect Breasts: The right breast is benign. The left breast is status post  lumpectomy and is currently receiving radiation. There is erythema but no desquamation. The left axilla is benign.  LAB RESULTS:  CMP     Component Value Date/Time   NA 142 02/21/2016 1439   K 3.9 02/21/2016 1439   CL 103 11/13/2013 2008   CO2 25 02/21/2016 1439   GLUCOSE 87 02/21/2016 1439   BUN 11.2 02/21/2016 1439   CREATININE 0.9 02/21/2016 1439   CALCIUM 9.4 02/21/2016 1439   PROT 7.2 02/21/2016 1439   ALBUMIN 3.8 02/21/2016 1439   AST 25 02/21/2016 1439   ALT 29 02/21/2016 1439   ALKPHOS 68 02/21/2016 1439   BILITOT 0.37 02/21/2016 1439   GFRNONAA 61 (L) 11/13/2013 2008   GFRAA 71 (L) 11/13/2013 2008    INo results found for: SPEP, UPEP  Lab Results  Component Value Date   WBC 4.1 02/21/2016   NEUTROABS 2.9 02/21/2016   HGB 13.0 02/21/2016   HCT 40.5 02/21/2016   MCV 87.7 02/21/2016   PLT 233 02/21/2016      Chemistry      Component Value Date/Time   NA 142 02/21/2016 1439   K 3.9 02/21/2016 1439   CL 103  11/13/2013 2008   CO2 25 02/21/2016 1439   BUN 11.2 02/21/2016 1439   CREATININE 0.9 02/21/2016 1439      Component Value Date/Time   CALCIUM 9.4 02/21/2016 1439   ALKPHOS 68 02/21/2016 1439   AST 25 02/21/2016 1439   ALT 29 02/21/2016 1439   BILITOT 0.37 02/21/2016 1439       No results found for: LABCA2  No components found for: LABCA125  No results for input(s): INR in the last 168 hours.  Urinalysis    Component Value Date/Time   COLORURINE YELLOW 11/13/2013 2030   APPEARANCEUR CLOUDY (A) 11/13/2013 2030   LABSPEC 1.015 10/17/2015 1102   PHURINE 6.5 10/17/2015 1102   PHURINE 5.5 11/13/2013 2030   GLUCOSEU Negative 10/17/2015 1102   HGBUR Trace 10/17/2015 1102   HGBUR NEGATIVE 11/13/2013 2030   BILIRUBINUR Negative 10/17/2015 1102   KETONESUR Negative 10/17/2015 1102   KETONESUR 15 (A) 11/13/2013 2030   PROTEINUR 30 10/17/2015 1102   PROTEINUR NEGATIVE 11/13/2013 2030   UROBILINOGEN 0.2 10/17/2015 1102   NITRITE Negative 10/17/2015 1102   NITRITE NEGATIVE 11/13/2013 2030   LEUKOCYTESUR Negative 10/17/2015 1102      ELIGIBLE FOR AVAILABLE RESEARCH PROTOCOL: PALLAS  STUDIES:No results found.    ASSESSMENT: 50 y.o. [place] woman status post left breast upper outer quadrant biopsy 08/03/2015 for a clinical T2 N0, stage IIa invasive ductal carcinoma, grade 3, estrogen and progesterone receptor positive, HER-2 not amplified, with an MIB-1 of 70%  (1) status post left lumpectomy and sentinel lymph node sampling 08/28/2015 for a pT2 pN0, stage IIA invasive ductal carcinoma, grade 3, with close but negative margins. Repeat HER-2 was again negative  (a) oncotype DX Score of 22 predicts a risk of recurrence outside the breast within 10 years of 14% if the patient's only systemic therapy is tamoxifen for 5 years. 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 The biggest thing of Tyler is that she is going through a very abrupt transition into menopause. She is having horrible hot flashes, or mood goes up and down, and on top of that of Tyler she is receiving radiation and is severely fatigued. Her hair is taking its time coming back Ashley Tyler although it looks like it will come back fully. This of Tyler is a challenge to her feminine 68. All this is making her life difficult at this point but I reassured her is transitional and she will feel considerably better in just a few months.  She did not get any benefit from gabapentin at 100 mg daily but she also had no side effects. Going to up the dose to 300 mg at bedtime. I also am starting her on venlafaxine 75 mg daily. We discussed the possible toxicities, side effects and complications of these agents, which I think will make a significant difference to her over the next several months.  I also refilled her lorazepam. She understands this drug is benign at does induce tolerance and the more she uses it less well going to work for her.  I am going to see her again in the second half of October. At that time if she is more stable we will consider starting tamoxifen. I will then refer her to the [facility] has a good understanding of this plan. She agrees with it. She knows a goal of treatment in her case is cure. She will call with any problems that may develop before her next  visit.    Ashley Cruel, MD   02/21/2016 6:51 PM

## 2016-02-22 ENCOUNTER — Ambulatory Visit
Admission: RE | Admit: 2016-02-22 | Discharge: 2016-02-22 | Disposition: A | Discharge status: Home / Self Care | Payer: 59 | Source: Ambulatory Visit | Attending: Radiation Oncology | Admitting: Radiation Oncology

## 2016-02-22 DIAGNOSIS — C50412 Malignant neoplasm of upper-outer quadrant of left female breast: Secondary | ICD-10-CM | POA: Diagnosis not present

## 2016-02-25 ENCOUNTER — Encounter: Payer: Self-pay | Admitting: Radiation Oncology

## 2016-02-25 ENCOUNTER — Ambulatory Visit
Admission: RE | Admit: 2016-02-25 | Discharge: 2016-02-25 | Disposition: A | Discharge status: Home / Self Care | Payer: 59 | Source: Ambulatory Visit | Attending: Radiation Oncology | Admitting: Radiation Oncology

## 2016-02-25 VITALS — BP 106/74 | HR 70 | Temp 98.2°F | Resp 18 | Ht 65.0 in | Wt 177.6 lb

## 2016-02-25 DIAGNOSIS — C50412 Malignant neoplasm of upper-outer quadrant of left female breast: Secondary | ICD-10-CM

## 2016-02-25 NOTE — Progress Notes (Signed)
   Weekly Management Note:  outpatient    ICD-9-CM ICD-10-CM   1. Breast cancer of upper-outer quadrant of left female breast (HCC) 174.4 C50.412     Current Dose:  50 Gy  Projected Dose: 60 Gy   Narrative:  The patient presents for routine under treatment assessment.  CBCT/MVCT images/Port film x-rays were reviewed.  The chart was checked. Tired.  Physical Findings:  height is 5\' 5"  (1.651 m) and weight is 177 lb 9.6 oz (80.6 kg). Her oral temperature is 98.2 F (36.8 C). Her blood pressure is 106/74 and her pulse is 70. Her respiration is 18 and oxygen saturation is 100%.   Wt Readings from Last 3 Encounters:  02/25/16 177 lb 9.6 oz (80.6 kg)  02/21/16 180 lb 1.6 oz (81.7 kg)  02/18/16 179 lb 8 oz (81.4 kg)  Patient has hyperpigmentation over the left breast. Skin is intact.  Impression:  The patient is tolerating radiotherapy.  Plan:  Continue radiotherapy as planned. Encouragement given.    ________________________________   [name], M.D.    This document serves as a record of services personally performed by [name], MD. It was created on her behalf by Bethann Humble, a trained medical scribe. The creation of this record is based on the scribe's personal observations and the provider's statements to them. This document has been checked and approved by the attending provider.

## 2016-02-25 NOTE — Progress Notes (Signed)
Ashley Tyler is here for her 125th fraction of radiation to her left Breast. She denies pain at this time, but does report occasional sharp pain in her Left Breast. Her Left Breast is hyperpigmented, and she is using the Radiaplex twice daily as directed. Having fatigue all during the day while at work. Wt Readings from Last 3 Encounters:  02/25/16 177 lb 9.6 oz (80.6 kg)  02/21/16 180 lb 1.6 oz (81.7 kg)  02/18/16 179 lb 8 oz (81.4 kg)  BP 106/74 (BP Location: Right Leg, Patient Position: Sitting, Cuff Size: Normal)   Pulse 70   Temp 98.2 F (36.8 C) (Oral)   Resp 18   Ht 5\' 5"  (1.651 m)   Wt 177 lb 9.6 oz (80.6 kg)   SpO2 100%   BMI 29.55 kg/m

## 2016-02-26 ENCOUNTER — Ambulatory Visit
Admission: RE | Admit: 2016-02-26 | Discharge: 2016-02-26 | Disposition: A | Discharge status: Home / Self Care | Payer: 59 | Source: Ambulatory Visit | Attending: Radiation Oncology | Admitting: Radiation Oncology

## 2016-02-26 DIAGNOSIS — C50412 Malignant neoplasm of upper-outer quadrant of left female breast: Secondary | ICD-10-CM | POA: Diagnosis not present

## 2016-02-27 ENCOUNTER — Ambulatory Visit
Admission: RE | Admit: 2016-02-27 | Discharge: 2016-02-27 | Disposition: A | Discharge status: Home / Self Care | Payer: 59 | Source: Ambulatory Visit | Attending: Radiation Oncology | Admitting: Radiation Oncology

## 2016-02-27 DIAGNOSIS — C50412 Malignant neoplasm of upper-outer quadrant of left female breast: Secondary | ICD-10-CM | POA: Diagnosis not present

## 2016-02-28 ENCOUNTER — Ambulatory Visit
Admission: RE | Admit: 2016-02-28 | Discharge: 2016-02-28 | Disposition: A | Discharge status: Home / Self Care | Payer: 59 | Source: Ambulatory Visit | Attending: Radiation Oncology | Admitting: Radiation Oncology

## 2016-02-28 DIAGNOSIS — C50412 Malignant neoplasm of upper-outer quadrant of left female breast: Secondary | ICD-10-CM | POA: Diagnosis not present

## 2016-02-29 ENCOUNTER — Ambulatory Visit
Admission: RE | Admit: 2016-02-29 | Discharge: 2016-02-29 | Disposition: A | Discharge status: Home / Self Care | Payer: 59 | Source: Ambulatory Visit | Attending: Radiation Oncology | Admitting: Radiation Oncology

## 2016-02-29 DIAGNOSIS — C50412 Malignant neoplasm of upper-outer quadrant of left female breast: Secondary | ICD-10-CM | POA: Diagnosis not present

## 2016-03-03 ENCOUNTER — Encounter: Payer: Self-pay | Admitting: Radiation Oncology

## 2016-03-03 ENCOUNTER — Ambulatory Visit
Admission: RE | Admit: 2016-03-03 | Discharge: 2016-03-03 | Disposition: A | Discharge status: Home / Self Care | Payer: 59 | Source: Ambulatory Visit | Attending: Radiation Oncology | Admitting: Radiation Oncology

## 2016-03-03 ENCOUNTER — Encounter: Payer: Self-pay | Admitting: *Deleted

## 2016-03-03 VITALS — BP 115/80 | HR 74 | Temp 97.9°F | Ht 65.0 in | Wt 176.5 lb

## 2016-03-03 DIAGNOSIS — C50412 Malignant neoplasm of upper-outer quadrant of left female breast: Secondary | ICD-10-CM | POA: Diagnosis not present

## 2016-03-03 DIAGNOSIS — Z170 Estrogen receptor positive status [ER+]: Principal | ICD-10-CM

## 2016-03-03 NOTE — Progress Notes (Addendum)
Ashley Tyler has finished treatment with 30 fractions to her left breast.  She reports having a migraine today and is feeling nauseated.  She also reports having soreness under her left arm as well as fatigue.  She is using radiaplex gel.  She has been given a one month follow up appointment.  The skin on her left breast is red with hyperpigmentation.  She has a small area of peeling under her left breast.  BP 115/80 (BP Location: Right Arm, Patient Position: Sitting)   Pulse 74   Temp 97.9 F (36.6 C) (Oral)   Ht 5\' 5"  (1.651 m)   Wt 176 lb 8 oz (80.1 kg)   SpO2 100%   BMI 29.37 kg/m    Wt Readings from Last 3 Encounters:  03/03/16 176 lb 8 oz (80.1 kg)  02/25/16 177 lb 9.6 oz (80.6 kg)  02/21/16 180 lb 1.6 oz (81.7 kg)

## 2016-03-03 NOTE — Progress Notes (Signed)
   Weekly Management Note  Outpatient    ICD-9-CM ICD-10-CM   1. Malignant neoplasm of upper-outer quadrant of left breast in female, estrogen receptor positive ([place]) 174.4 C50.412    V86.0 Z17.0     Completed Radiotherapy. Total Dose:  60Gy   Narrative:  The patient presents for routine under treatment assessment on last day of radiotherapy.  CBCT/MVCT images/Port film x-rays were reviewed.  The chart was checked. Ashley Tyler has finished treatment with 30 fractions to her left breast.  She reports having a migraine today and is feeling nauseated.  She has had migraines before. She also reports having soreness under her left arm as well as fatigue.  She is using radiaplex gel.  She has been given a one month follow up appointment.  The skin on her left breast is red with hyperpigmentation.  She has a small area of peeling under her left breast.  Physical Findings:  height is 5\' 5"  (1.651 m) and weight is 176 lb 8 oz (80.1 kg). Her oral temperature is 97.9 F (36.6 C). Her blood pressure is 115/80 and her pulse is 74. Her oxygen saturation is 100%.   Wearing sunglasses.  Left breast is hyperpigmented, as is axilla.  Small area of superficial peeling at IM fold of left.  Impression:  The patient has tolerated radiotherapy.  Plan:  Routine follow-up in one month.  Neosporin over peeling in IM fold ________________________________   [name], M.D.

## 2016-03-13 NOTE — Progress Notes (Signed)
  Radiation Oncology         [phone] ________________________________  Name: Ashley Tyler MRN: [id number]  Date: 03/03/2016  DOB: [birth year]  End of Treatment Note  DIAGNOSIS:     ICD-9-CM ICD-10-CM   1. Breast cancer of upper-outer quadrant of left female breast ([place]) 174.4 C50.412    Indication for treatment: curative       Radiation treatment dates:   01/21/2016-03/03/2016  Site/dose:   Site/dose:   1) Left Breast / 50 Gy in 25 fractions 2) Left Breast Boost / 10 Gy in 5 fractions  Beams/energy:  1) tangents, 3D conformal  / 10 and 6MV 2) 2 field photons /  6MV  Narrative: The patient tolerated radiation treatment relatively well.      Plan: The patient has completed radiation treatment. The patient will return to radiation oncology clinic for routine followup in one month. I advised them to call or return sooner if they have any questions or concerns related to their recovery or treatment.  -----------------------------------  [name], MD

## 2016-03-18 ENCOUNTER — Other Ambulatory Visit (HOSPITAL_BASED_OUTPATIENT_CLINIC_OR_DEPARTMENT_OTHER): Payer: 59

## 2016-03-18 DIAGNOSIS — C50412 Malignant neoplasm of upper-outer quadrant of left female breast: Secondary | ICD-10-CM

## 2016-03-18 LAB — COMPREHENSIVE METABOLIC PANEL
ALT: 18 U/L (ref 0–55)
ANION GAP: 9 meq/L (ref 3–11)
AST: 23 U/L (ref 5–34)
Albumin: 4 g/dL (ref 3.5–5.0)
Alkaline Phosphatase: 78 U/L (ref 40–150)
BUN: 13.1 mg/dL (ref 7.0–26.0)
CHLORIDE: 106 meq/L (ref 98–109)
CO2: 24 meq/L (ref 22–29)
CREATININE: 1 mg/dL (ref 0.6–1.1)
Calcium: 9.5 mg/dL (ref 8.4–10.4)
EGFR: 77 mL/min/{1.73_m2} — ABNORMAL LOW (ref >90)
Glucose: 86 mg/dl (ref 70–140)
POTASSIUM: 4.3 meq/L (ref 3.5–5.1)
Sodium: 140 mEq/L (ref 136–145)
Total Bilirubin: <0.22 mg/dL (ref 0.20–1.20)
Total Protein: 7.6 g/dL (ref 6.4–8.3)

## 2016-03-18 LAB — CBC WITH DIFFERENTIAL/PLATELET
BASO%: 0.2 % (ref 0.0–2.0)
Basophils Absolute: 0 10*3/uL (ref 0.0–0.1)
EOS%: 1.3 % (ref 0.0–7.0)
Eosinophils Absolute: 0.1 10*3/uL (ref 0.0–0.5)
HCT: 41.1 % (ref 34.8–46.6)
HGB: 13.4 g/dL (ref 11.6–15.9)
LYMPH%: 30.8 % (ref 14.0–49.7)
MCH: 28.2 pg (ref 25.1–34.0)
MCHC: 32.6 g/dL (ref 31.5–36.0)
MCV: 86.3 fL (ref 79.5–101.0)
MONO#: 0.3 10*3/uL (ref 0.1–0.9)
MONO%: 5.8 % (ref 0.0–14.0)
NEUT#: 2.8 10*3/uL (ref 1.5–6.5)
NEUT%: 61.9 % (ref 38.4–76.8)
PLATELETS: 233 10*3/uL (ref 145–400)
RBC: 4.76 10*6/uL (ref 3.70–5.45)
RDW: 13 % (ref 11.2–14.5)
WBC: 4.5 10*3/uL (ref 3.9–10.3)
lymph#: 1.4 10*3/uL (ref 0.9–3.3)

## 2016-03-19 LAB — FOLLICLE STIMULATING HORMONE: FSH: 80.9 m[IU]/mL

## 2016-03-21 LAB — ESTRADIOL, ULTRA SENS: Estradiol, Sensitive: 17.1 pg/mL

## 2016-03-24 NOTE — Progress Notes (Signed)
Preparing myself am sorry to her more sore and your  [place]  [phone] [phone]   ID: [name] DOB: Nov 17, 1965  MR#: [id number]  GYB#:[id number]  Patient Care Team: No Pcp Per Patient as PCP - General (General Practice) Chauncey Cruel, MD as Consulting Physician (Oncology) [name], MD as Consulting Physician (General Surgery) [name], MD as Attending Physician (Radiation Oncology) PCP: No PCP Per Patient GYN: OTHER MD:  CHIEF COMPLAINT: Estrogen receptor positive breast cancer  CURRENT TREATMENT: To start tamoxifen in early December 2017  BREAST CANCER HISTORY: From the original intake note:  [name] had screening mammography at Dr. Caralee Ates office 07/24/2015 suggesting a left breast mass and the patient was referred to the [facility] where on 07/30/2015 she underwent left diagnostic mammography with tomosynthesis and left breast ultrasonography. The breast density was category C. In the upper outer quadrant of the left breast there was a 2.3 cm spiculated mass. There were no associated calcifications. This mass was palpable at the 1:00 position 5 cm from the nipple. Ultrasound confirmed an irregular hypoechoic mass measuring 1.8 cm. Ultrasonography of the left axilla was negative.  On 08/03/2015 the patient underwent biopsy of the left breast mass in question. 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  The patient then met with surgery and after appropriate discussion she proceeded to left lumpectomy and sentinel lymph node sampling 08/28/2015. The final pathology ([id number]) showed invasive ductal carcinoma, grade 3, measuring 2.1 cm. Margins were close but negative. All 3  sentinel lymph nodes were clear. Repeat HER-2 was again negative, with a signals ratio of 0.9, and number per cell 2.20.  INTERVAL HISTORY: Ilyse returns today for follow-up of her left-sided breast cancer. She completed her radiation treatments the first week in October. She feels her breast is "still a mass". She had significant desquamation but no pain. She is still using the ER and endocrine provided by radiation and feels this is helping her skin to heal  REVIEW OF SYSTEMS: Aubrea went back to work full-time this week. She finds it a bit stressful. She had a little headache today. This is uncommon for her and she thinks it may be due to eyestrain from work. As far as her menopausal symptoms is concerned she is getting great benefit from venlafaxine. She has stopped the Neurontin which she does not feel was helping very much. She has managed to lose a little weight. She sleeps "okay". Hot flashes are now very manageable. She remains fatigued. A detailed review of systems today was otherwise stable  PAST MEDICAL HISTORY: Past Medical History:  Diagnosis Date  . Cancer ([place])    left breast  . Chronic constipation   . Endometriosis   . Family history of breast cancer   . Family history of prostate cancer   . Headache   . Migraines     PAST SURGICAL HISTORY: Past Surgical History:  Procedure Laterality Date  . BREAST LUMPECTOMY WITH RADIOACTIVE SEED AND SENTINEL LYMPH NODE BIOPSY Left 08/28/2015   Procedure: LEFT BREAST LUMPECTOMY WITH RADIOACTIVE SEED AND SENTINEL LYMPH NODE BIOPSY;  Surgeon: [name], MD;  Location: [facility];  Service: General;  Laterality: Left;  . CESAREAN SECTION    . LAPAROSCOPIC SALPINGO OOPHERECTOMY Right   .  TONSILLECTOMY      FAMILY HISTORY Family History  Problem Relation Age of Onset  . Prostate cancer Father 27    Gleason = 7  . Prostate cancer Maternal Uncle     dx in his 50s  . Breast cancer Paternal Aunt     dx in her  63s  . Prostate cancer Paternal Uncle   . Alzheimer's disease Maternal Grandmother   . Lung cancer Maternal Grandfather   . Congestive Heart Failure Paternal Grandmother   . Prostate cancer Maternal Uncle     dx in his 82s  . Breast cancer Paternal Aunt     dx in her 58s; maternal half paternal aunt  . Prostate cancer Paternal Uncle     dx in his 81s  . Breast cancer Cousin   . Breast cancer Cousin     father's maternal cousin's daughter; dx in her 23s-30s  The patient's parents are still living, in their early 35s as of April 2016. The patient is an only child. On her father's side there are 4 cases of breast cancer, 2 aunts diagnosed in their 60s and 44s, and 2 cousins diagnosed in her 44s and one at age 34. The patient's father had prostate cancer diagnosed age 11.  GYNECOLOGIC HISTORY:  No LMP recorded. Menarche age 50, first live birth age 50. The patient is GX P2, although both children were born early. She is status post right salpingo-oophorectomy due to endometriosis. 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 At home is just she and her daughter Ashley Tyler, 36. Her other daughter, Ashley Tyler, is studying criminal justice at [employer]. Her SO is this is with. [name]    ADVANCED DIRECTIVES: Not in place   HEALTH MAINTENANCE: Social History  Substance Use Topics  . Smoking status: Never Smoker  . Smokeless tobacco: Not on file  . Alcohol use Yes     Comment: social     Colonoscopy:  PAP:  Bone density:  Lipid panel:  Allergies  Allergen Reactions  . Tetracyclines & Related Itching  . Other Rash    Powder in gloves causes rash    Current Outpatient Prescriptions  Medication Sig Dispense Refill  . hyaluronate sodium (RADIAPLEXRX) GEL Apply 1 application topically once.    [name] ibuprofen (ADVIL,MOTRIN) 200 MG tablet Take 400 mg by mouth  daily as needed for headache. Reported on 08/06/1060    . non-metallic deodorant Jethro Poling) MISC Apply 1 application topically daily as needed.    . tamoxifen (NOLVADEX) 20 MG tablet Take 1 tablet (20 mg total) by mouth daily. 90 tablet 12  . venlafaxine XR (EFFEXOR-XR) 75 MG 24 hr capsule Take 1 capsule (75 mg total) by mouth daily with breakfast. 90 capsule 4   No current facility-administered medications for this visit.     OBJECTIVE: Middle-aged African-American woman Who appears younger than stated age 31:   03/25/16 1625  BP: 123/75  Pulse: 72  Resp: 18  Temp: 98.6 F (37 C)     Body mass index is 29.22 kg/m.    ECOG FS:1 - Symptomatic but completely ambulatory  Sclerae unicteric, EOMs intact Oropharynx clear and moist No cervical or supraclavicular adenopathy Lungs no rales or rhonchi Heart regular rate and rhythm Abd soft, nontender, positive bowel sounds MSK no focal spinal tenderness, no upper extremity lymphedema Neuro: nonfocal, well oriented, appropriate affect  Breasts: The right breast is unremarkable. The left breast is status post lumpectomy and radiation. There is some residual desquamation, but the hyperpigmentation appears to be rapidly fading. The breast cosmetic result is excellent. There is no evidence of disease recurrence. The left axilla is benign.   LAB RESULTS:  CMP     Component Value Date/Time   NA 140 03/18/2016 1541   K 4.3 03/18/2016 1541   CL 103 11/13/2013 2008   CO2 24 03/18/2016 1541   GLUCOSE 86 03/18/2016 1541   BUN 13.1 03/18/2016 1541   CREATININE 1.0 03/18/2016 1541   CALCIUM 9.5 03/18/2016 1541   PROT 7.6 03/18/2016 1541   ALBUMIN 4.0 03/18/2016 1541   AST 23 03/18/2016 1541   ALT 18 03/18/2016 1541   ALKPHOS 78 03/18/2016 1541   BILITOT <0.22 03/18/2016 1541   GFRNONAA 61 (L) 11/13/2013 2008   GFRAA 71 (L) 11/13/2013 2008    INo results found for: SPEP, UPEP  Lab Results  Component Value Date   WBC 4.5 03/18/2016    NEUTROABS 2.8 03/18/2016   HGB 13.4 03/18/2016   HCT 41.1 03/18/2016   MCV 86.3 03/18/2016   PLT 233 03/18/2016      Chemistry      Component Value Date/Time   NA 140 03/18/2016 1541   K 4.3 03/18/2016 1541   CL 103 11/13/2013 2008   CO2 24 03/18/2016 1541   BUN 13.1 03/18/2016 1541   CREATININE 1.0 03/18/2016 1541      Component Value Date/Time   CALCIUM 9.5 03/18/2016 1541   ALKPHOS 78 03/18/2016 1541   AST 23 03/18/2016 1541   ALT 18 03/18/2016 1541   BILITOT <0.22 03/18/2016 1541       No results found for: LABCA2  No components found for: LABCA125  No results for input(s): INR in the last 168 hours.  Urinalysis    Component Value Date/Time   COLORURINE YELLOW 11/13/2013 2030   APPEARANCEUR CLOUDY (A) 11/13/2013 2030   LABSPEC 1.015 10/17/2015 1102   PHURINE 6.5 10/17/2015 1102   PHURINE 5.5 11/13/2013 2030   GLUCOSEU Negative 10/17/2015 1102   HGBUR Trace 10/17/2015 1102   HGBUR NEGATIVE 11/13/2013 2030   BILIRUBINUR Negative 10/17/2015 1102   KETONESUR Negative 10/17/2015 1102   KETONESUR 15 (A) 11/13/2013 2030   PROTEINUR 30 10/17/2015 1102   PROTEINUR NEGATIVE 11/13/2013 2030   UROBILINOGEN 0.2 10/17/2015 1102   NITRITE Negative 10/17/2015 1102   NITRITE NEGATIVE 11/13/2013 2030   LEUKOCYTESUR Negative 10/17/2015 1102      ELIGIBLE FOR AVAILABLE RESEARCH PROTOCOL: PALLAS  STUDIES:No results found.  DUAL X-RAY ABSORPTIOMETRY (DXA) FOR BONE MINERAL DENSITY IMPRESSION: Referring Physician:  Chauncey Cruel PATIENT: Name: Rowene, Suto Patient ID: [id number] Birth Date: 15-Jan-1966 Height: 65.0 in. Sex: Female Measured: 12/28/2015 Weight: 177.0 lbs. Indications: Breast Cancer History, Estrogen Deficient, Omeprazole, Postmenopausal, Secondary Osteoporosis Fractures: None Treatments: Calcium (E943.0), Vitamin D (E933.5) ASSESSMENT: The BMD measured at Femur Neck Left is 0.832 g/cm2 with a T-score of -1.5. This patient is considered  osteopenic according to [facility]) criteria. Site Region Measured Date Measured Age YA BMD Significant CHANGE T-score DualFemur Neck Left [age]/2015    50.4         -1.5    0.832 g/cm2 AP Spine  L1-L4     11/28/[age]    [age]         0.2     1.220 g/cm2 World Health Organization [facility]) criteria for post-menopausal, Caucasian Women:  Normal       T-score at or above -1 SD Osteopenia   T-score between -1 and -2.5 SD Osteoporosis T-score at or below -2.5 SD  ASSESSMENT: 50 y.o. [place] woman status post left breast upper outer quadrant biopsy 08/03/2015 for a clinical T2 N0, stage IIa invasive ductal carcinoma, grade 3, estrogen and progesterone receptor positive, HER-2 not amplified, with an MIB-1 of 70%  (1) status post left lumpectomy and sentinel lymph node sampling 08/28/2015 for a pT2 pN0, stage IIA invasive ductal carcinoma, grade 3, with close but negative margins. Repeat HER-2 was again negative  (a) oncotype DX Score of 22 predicts a risk of recurrence outside the breast within 10 years of 14% if the patient's only systemic therapy is tamoxifen for 5 years. The addition of CAF reduced the risk an additional 4 %  (2) adjuvant chemotherapy consisting of cyclophosphamide and  docetaxel started 10/11/2015  (a) changed to cyclophosphamide and doxorubicin for 3 cycles, after first cycle of TC because of elevated LFTs, completed 12/13/2015  (3) adjuvant radiation 01/21/2016-03/03/2016   (4) To start tamoxifen 05/02/2016, with consideration of the PALLAS trial to follow  (a) bone density 12/20/2015 shows mild osteopenia, with a T score of -1.5.  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 The venlafaxine is also helping control some of the menopausal symptoms. It is remarkable that she has managed to lose some weight since most patients in her situation and obtaining 2015 and 20 pounds.  She is still moderately fatigued and only just went back to work full time. Nevertheless I encouraged her to start some sort of exercise program.  I don't think it would be wise to start tamoxifen until December 1. By then she should be .Getting over some of the fatigue from radiation and be more used to the menopausal symptoms. We did discuss the fact that her Esparto is elevated and her estradiol low, consistent with her being currently in a menopausal state, but that these things can change and unless these labs remained essentially the same for 1 year we will not feel sure that she is definitively menopausal.  She is going to see me again late January, by which time she will have been on tamoxifen 2 months and we should know if she will be able to tolerated well or not. She knows to call for any problems that may develop before that visit    Chauncey Cruel, MD   03/25/2016 6:32 PM

## 2016-03-25 ENCOUNTER — Ambulatory Visit (HOSPITAL_BASED_OUTPATIENT_CLINIC_OR_DEPARTMENT_OTHER): Payer: 59 | Admitting: Oncology

## 2016-03-25 VITALS — BP 123/75 | HR 72 | Temp 98.6°F | Resp 18 | Ht 65.0 in | Wt 175.6 lb

## 2016-03-25 DIAGNOSIS — C50412 Malignant neoplasm of upper-outer quadrant of left female breast: Secondary | ICD-10-CM | POA: Diagnosis not present

## 2016-03-25 DIAGNOSIS — E349 Endocrine disorder, unspecified: Secondary | ICD-10-CM | POA: Diagnosis not present

## 2016-03-25 DIAGNOSIS — Z170 Estrogen receptor positive status [ER+]: Secondary | ICD-10-CM

## 2016-03-25 MED ORDER — TAMOXIFEN CITRATE 20 MG PO TABS: 20.0000 mg | ORAL_TABLET | Freq: Every day | ORAL | 12 refills | Status: AC

## 2016-04-07 ENCOUNTER — Encounter (HOSPITAL_COMMUNITY): Payer: Self-pay | Admitting: Emergency Medicine

## 2016-04-07 ENCOUNTER — Emergency Department (HOSPITAL_COMMUNITY)
Admission: EM | Admit: 2016-04-07 | Discharge: 2016-04-07 | Disposition: A | Discharge status: Home / Self Care | Payer: 59 | Attending: Emergency Medicine | Admitting: Emergency Medicine

## 2016-04-07 ENCOUNTER — Emergency Department (HOSPITAL_COMMUNITY): Payer: 59

## 2016-04-07 ENCOUNTER — Telehealth: Payer: Self-pay | Admitting: *Deleted

## 2016-04-07 DIAGNOSIS — R112 Nausea with vomiting, unspecified: Secondary | ICD-10-CM | POA: Diagnosis not present

## 2016-04-07 DIAGNOSIS — Z853 Personal history of malignant neoplasm of breast: Secondary | ICD-10-CM | POA: Insufficient documentation

## 2016-04-07 LAB — URINE MICROSCOPIC-ADD ON
BACTERIA UA: NONE SEEN
RBC / HPF: NONE SEEN RBC/hpf (ref 0–5)
SQUAMOUS EPITHELIAL / LPF: NONE SEEN

## 2016-04-07 LAB — URINALYSIS, ROUTINE W REFLEX MICROSCOPIC
BILIRUBIN URINE: NEGATIVE
Glucose, UA: NEGATIVE mg/dL
Hgb urine dipstick: NEGATIVE
Ketones, ur: 40 mg/dL — AB
NITRITE: NEGATIVE
PH: 8.5 — AB (ref 5.0–8.0)
Protein, ur: 30 mg/dL — AB
SPECIFIC GRAVITY, URINE: 1.028 (ref 1.005–1.030)

## 2016-04-07 LAB — COMPREHENSIVE METABOLIC PANEL
ALBUMIN: 4.8 g/dL (ref 3.5–5.0)
ALT: 27 U/L (ref 14–54)
ANION GAP: 7 (ref 5–15)
AST: 29 U/L (ref 15–41)
Alkaline Phosphatase: 62 U/L (ref 38–126)
BILIRUBIN TOTAL: 0.9 mg/dL (ref 0.3–1.2)
BUN: 15 mg/dL (ref 6–20)
CO2: 30 mmol/L (ref 22–32)
Calcium: 9.8 mg/dL (ref 8.9–10.3)
Chloride: 106 mmol/L (ref 101–111)
Creatinine, Ser: 0.97 mg/dL (ref 0.44–1.00)
GFR calc non Af Amer: >60 mL/min (ref >60)
GLUCOSE: 104 mg/dL — AB (ref 65–99)
POTASSIUM: 3.7 mmol/L (ref 3.5–5.1)
SODIUM: 143 mmol/L (ref 135–145)
TOTAL PROTEIN: 8.3 g/dL — AB (ref 6.5–8.1)

## 2016-04-07 LAB — CBC
HEMATOCRIT: 43.8 % (ref 36.0–46.0)
HEMOGLOBIN: 14.5 g/dL (ref 12.0–15.0)
MCH: 27.9 pg (ref 26.0–34.0)
MCHC: 33.1 g/dL (ref 30.0–36.0)
MCV: 84.2 fL (ref 78.0–100.0)
Platelets: 263 10*3/uL (ref 150–400)
RBC: 5.2 MIL/uL — AB (ref 3.87–5.11)
RDW: 13.2 % (ref 11.5–15.5)
WBC: 5.2 10*3/uL (ref 4.0–10.5)

## 2016-04-07 LAB — LIPASE, BLOOD: Lipase: 14 U/L (ref 11–51)

## 2016-04-07 MED ORDER — SODIUM CHLORIDE 0.9 % IV BOLUS (SEPSIS)
1000.0000 mL | Freq: Once | INTRAVENOUS | Status: AC
  Administered 2016-04-07: 1000 mL via INTRAVENOUS

## 2016-04-07 MED ORDER — ONDANSETRON HCL 4 MG/2ML IJ SOLN
4.0000 mg | Freq: Once | INTRAMUSCULAR | Status: AC
  Administered 2016-04-07: 4 mg via INTRAVENOUS
  Filled 2016-04-07: qty 2

## 2016-04-07 MED ORDER — ONDANSETRON 4 MG PO TBDP: 4.0000 mg | ORAL_TABLET | Freq: Three times a day (TID) | ORAL | 0 refills | Status: DC | PRN

## 2016-04-07 MED ORDER — PROMETHAZINE HCL 25 MG RE SUPP: 25.0000 mg | Freq: Four times a day (QID) | RECTAL | 0 refills | Status: DC | PRN

## 2016-04-07 NOTE — ED Notes (Signed)
Pt able to tolerate PO ginger ale and applesauce

## 2016-04-07 NOTE — ED Triage Notes (Signed)
Pt c/o abdominal cramping and pain with N/V x 2 days. Pt has breast cancer. FInished treatments in October, is set to start a new treatment in December. Pt called cancer doctor and was told to come in and be seen. Pt denies diarrhea, fever. Pt sts she is constantly nauseous and emesis and pain comes mainly after she eats or drinks. Pt A&Ox4. Pt diaphoretic in triage.

## 2016-04-07 NOTE — ED Provider Notes (Signed)
[facility] DEPT Provider Note   CSN: [id number] Arrival date & time: 04/07/16  [id number]     History   Chief Complaint Chief Complaint  Patient presents with  . Cancer  . Emesis  . Abdominal Pain    HPI Ashley Tyler is a 50 y.o. female.  HPI  50 year old female presents with a chief complaint of nausea and vomiting. She has a history of left breast cancer that has had a lumpectomy, chemotherapy, and radiation. Last chemotherapy was about 4 months ago and her radiation was last month. Yesterday just prior to church she started having nausea which lasted about 12 hours. Since then she has been having vomiting. Vomiting is only whenever she tries to eat or drink. Cannot keep down water, ginger ale, or other fluids. The night before she had salmon but states multiple other family members had this as well and no one else is sick. There is no diarrhea. Had a normal bowel movement this morning. There is some associated lower abdominal pain only when vomiting. She thinks is because she is sore. No current abdominal pain. Has tried Compazine and one other nausea medicine she can't remember but both of these she threw up. She has had some intermittent hot flashes and sweating, not different than before.  Past Medical History:  Diagnosis Date  . Cancer ([place])    left breast  . Chronic constipation   . Endometriosis   . Family history of breast cancer   . Family history of prostate cancer   . Headache   . Migraines     Patient Active Problem List   Diagnosis Date Noted  . Genetic testing 10/30/2015  . Dehydration 10/19/2015  . Neoplasm related pain 10/19/2015  . Family history of breast cancer   . Family history of prostate cancer   . Breast cancer of upper-outer quadrant of left female breast ([place]) 09/13/2015    Past Surgical History:  Procedure Laterality Date  . BREAST LUMPECTOMY WITH RADIOACTIVE SEED AND SENTINEL LYMPH NODE BIOPSY Left 08/28/2015   Procedure: LEFT  BREAST LUMPECTOMY WITH RADIOACTIVE SEED AND SENTINEL LYMPH NODE BIOPSY;  Surgeon: [name], MD;  Location: [facility];  Service: General;  Laterality: Left;  . CESAREAN SECTION    . LAPAROSCOPIC SALPINGO OOPHERECTOMY Right   . TONSILLECTOMY      OB History    No data available       Home Medications    Prior to Admission medications   Medication Sig Start Date End Date Taking? Authorizing Provider  prochlorperazine (COMPAZINE) 5 MG tablet Take 5 mg by mouth every 6 (six) hours as needed for nausea or vomiting.   Yes Historical Provider, MD  venlafaxine XR (EFFEXOR-XR) 75 MG 24 hr capsule Take 1 capsule (75 mg total) by mouth daily with breakfast. 02/21/16  Yes Chauncey Cruel, MD  hyaluronate sodium (RADIAPLEXRX) GEL Apply 1 application topically once.    Historical Provider, MD  non-metallic deodorant Jethro Poling) MISC Apply 1 application topically daily as needed.    Historical Provider, MD  ondansetron (ZOFRAN ODT) 4 MG disintegrating tablet Take 1 tablet (4 mg total) by mouth every 8 (eight) hours as needed for nausea or vomiting. 04/07/16   Sherwood Gambler, MD  promethazine (PHENERGAN) 25 MG suppository Place 1 suppository (25 mg total) rectally every 6 (six) hours as needed for nausea or vomiting. 04/07/16   Sherwood Gambler, MD  tamoxifen (NOLVADEX) 20 MG tablet Take 1 tablet (20 mg total) by mouth daily. 03/25/16  04/24/16  Chauncey Cruel, MD    Family History Family History  Problem Relation Age of Onset  . Prostate cancer Father 50    Gleason = 7  . Prostate cancer Maternal Uncle     dx in his 53s  . Breast cancer Paternal Aunt     dx in her 43s  . Prostate cancer Paternal Uncle   . Alzheimer's disease Maternal Grandmother   . Lung cancer Maternal Grandfather   . Congestive Heart Failure Paternal Grandmother   . Prostate cancer Maternal Uncle     dx in his 74s  . Breast cancer Paternal Aunt     dx in her 28s; maternal half paternal aunt  . Prostate  cancer Paternal Uncle     dx in his 78s  . Breast cancer Cousin   . Breast cancer Cousin     father's maternal cousin's daughter; dx in her 32s-30s    Social History Social History  Substance Use Topics  . Smoking status: Never Smoker  . Smokeless tobacco: Not on file  . Alcohol use Yes     Comment: social     Allergies   Tetracyclines & related and Other   Review of Systems Review of Systems  Constitutional: Negative for fever.  Respiratory: Negative for shortness of breath.   Cardiovascular: Negative for chest pain.  Gastrointestinal: Positive for abdominal pain (only with vomiting), nausea and vomiting. Negative for blood in stool, constipation and diarrhea.  Genitourinary: Negative for dysuria.  Musculoskeletal: Negative for back pain.  All other systems reviewed and are negative.    Physical Exam Updated Vital Signs BP 110/69 (BP Location: Right Arm)   Pulse 67   Temp 98.1 F (36.7 C) (Oral)   Resp 18   LMP 10/17/2015 (Within Days) Comment: pt signed preg test waiver 04/07/16  SpO2 99%   Physical Exam  Constitutional: She is oriented to person, place, and time. She appears well-developed and well-nourished. No distress.  HENT:  Head: Normocephalic and atraumatic.  Right Ear: External ear normal.  Left Ear: External ear normal.  Nose: Nose normal.  Eyes: Right eye exhibits no discharge. Left eye exhibits no discharge.  Cardiovascular: Normal rate, regular rhythm and normal heart sounds.   Pulmonary/Chest: Effort normal and breath sounds normal.  Abdominal: Soft. She exhibits no distension. There is no tenderness.  Mild nonfocal tenderness periumbilically. When re-examined a few minutes later it is hard to reproduce  Neurological: She is alert and oriented to person, place, and time.  Skin: Skin is warm and dry. She is not diaphoretic.  Nursing note and vitals reviewed.    ED Treatments / Results  Labs (all labs ordered are listed, but only abnormal  results are displayed) Labs Reviewed  COMPREHENSIVE METABOLIC PANEL - Abnormal; Notable for the following:       Result Value   Glucose, Bld 104 (*)    Total Protein 8.3 (*)    All other components within normal limits  CBC - Abnormal; Notable for the following:    RBC 5.20 (*)    All other components within normal limits  URINALYSIS, ROUTINE W REFLEX MICROSCOPIC (NOT AT [facility]) - Abnormal; Notable for the following:    APPearance CLOUDY (*)    pH 8.5 (*)    Ketones, ur 40 (*)    Protein, ur 30 (*)    Leukocytes, UA TRACE (*)    All other components within normal limits  LIPASE, BLOOD  URINE MICROSCOPIC-ADD ON  EKG  EKG Interpretation  Date/Time:  Monday April 07 2016 19:06:20 EST Ventricular Rate:  72 PR Interval:    QRS Duration: 84 QT Interval:  399 QTC Calculation: 437 R Axis:   57 Text Interpretation:  Normal sinus rhythm no acute ST/T changes Confirmed by Jomo Forand MD, Britain Saber [phone]) on 04/07/2016 7:47:12 PM       Radiology Dg Abd Acute W/chest  Result Date: 04/07/2016 CLINICAL DATA:  Abdominal pain and cramping with nausea and vomiting for the past 2 days. Undergoing chemotherapy for breast cancer. EXAM: DG ABDOMEN ACUTE W/ 1V CHEST COMPARISON:  10/18/2010. FINDINGS: Normal sized heart. Clear lungs. Left breast surgical clips. Normal bowel gas pattern without free peritoneal air. Bilateral pelvic phleboliths. Unremarkable bones. IMPRESSION: No acute abnormality. Electronically Signed   By: Claudie Revering M.D.   On: 04/07/2016 18:54    Procedures Procedures (including critical care time)  Medications Ordered in ED Medications  ondansetron (ZOFRAN) injection 4 mg (4 mg Intravenous Given 04/07/16 1849)  sodium chloride 0.9 % bolus 1,000 mL (0 mLs Intravenous Stopped 04/07/16 2108)     Initial Impression / Assessment and Plan / ED Course  I have reviewed the triage vital signs and the nursing notes.  Pertinent labs & imaging results that were available during my  care of the patient were reviewed by me and considered in my medical decision making (see chart for details).  Clinical Course as of Apr 09 139  Mon Apr 07, 2016  1825 AAS, labs, fluids, zofran. Benign abd exam.  [SG]    Clinical Course User Index [SG] Sherwood Gambler, MD    Nausea has completely resolved. Tolerating PO. Possibly a viral GI illness, though no diarrhea. Doubt ACS. No abd pain, repeat abd exam without tenderness at all. Zofran, phenergan, oncology f/u. Discussed return precautions.   Final Clinical Impressions(s) / ED Diagnoses   Final diagnoses:  Nausea and vomiting in adult    New Prescriptions Discharge Medication List as of 04/07/2016  8:51 PM    START taking these medications   Details  ondansetron (ZOFRAN ODT) 4 MG disintegrating tablet Take 1 tablet (4 mg total) by mouth every 8 (eight) hours as needed for nausea or vomiting., Starting Mon 04/07/2016, Print    promethazine (PHENERGAN) 25 MG suppository Place 1 suppository (25 mg total) rectally every 6 (six) hours as needed for nausea or vomiting., Starting Mon 04/07/2016, Print         Sherwood Gambler, MD 04/08/16 0140

## 2016-04-07 NOTE — ED Notes (Signed)
Pt requesting to wait to talk to the EDP before drawing blood

## 2016-04-07 NOTE — ED Notes (Signed)
Pt states that her nausea has improved.

## 2016-04-07 NOTE — ED Notes (Signed)
Pt ambulated to restroom with a steady gait

## 2016-04-07 NOTE — ED Notes (Signed)
Pt states that she called her doctor to get a suppository for her nausea, but he stated that she needed to come get checked out.

## 2016-04-07 NOTE — ED Notes (Signed)
Pt states that her nausea remains despite the Zofran.

## 2016-04-07 NOTE — Telephone Encounter (Signed)
Call from pt reporting vomiting x2 days. Unable to keep fluids down. Denies constipation. Last BM today, formed stool. Pt tried taking antiemetic she had in the home, it was ineffective. She denies fever. Pt completed radiation 03/03/16. Last chemo in July. Instructed her to go to ED if she is unable to tolerate POs. She voiced understanding. Will make provider aware.

## 2016-04-07 NOTE — ED Notes (Addendum)
Pt aware of need for urine. She states that she does not need to go right now, but will try in a few minutes. Given cup.

## 2016-04-11 ENCOUNTER — Encounter (HOSPITAL_COMMUNITY): Payer: Self-pay

## 2016-04-14 ENCOUNTER — Other Ambulatory Visit: Payer: Self-pay | Admitting: Oncology

## 2016-04-14 ENCOUNTER — Ambulatory Visit (HOSPITAL_COMMUNITY)
Admission: RE | Admit: 2016-04-14 | Discharge: 2016-04-14 | Disposition: A | Discharge status: Home / Self Care | Payer: 59 | Source: Ambulatory Visit | Attending: Oncology | Admitting: Oncology

## 2016-04-14 ENCOUNTER — Telehealth: Payer: Self-pay | Admitting: *Deleted

## 2016-04-14 DIAGNOSIS — C50412 Malignant neoplasm of upper-outer quadrant of left female breast: Secondary | ICD-10-CM

## 2016-04-14 DIAGNOSIS — R1111 Vomiting without nausea: Secondary | ICD-10-CM | POA: Insufficient documentation

## 2016-04-14 DIAGNOSIS — E860 Dehydration: Secondary | ICD-10-CM

## 2016-04-14 DIAGNOSIS — R197 Diarrhea, unspecified: Secondary | ICD-10-CM | POA: Insufficient documentation

## 2016-04-14 NOTE — Telephone Encounter (Signed)
This RN spoke with pt - Ilyanna states she has had bouts of nausea, headaches and then recurrence of vomiting starting on Sunday- carrying through with last episode at 1230 pm today.  She has taken zofran SL at 8am and then a phenergan suppository at 1pm post vomiting again at 1230 pm.  She states she had a solid BM this am with continued BM's that were watery.  Bertha states concerns because she is due to start training for a new job on the 11/16.  She has not seen Dr Verdia Kuba since having a colonscopy over a year ago  Per above discussion- recommendations per MD review is for pt to maintain a lite " BRAT " diet,and good hydration.  She should repeat the zofran this pm ( 12 hours post prior dose ).  She needs to use Claritin and 12 sudafed for headache vs use of advil.  Order for KUB entered and pt will obtain either this pm or in am and contact this RN.

## 2016-04-14 NOTE — Telephone Encounter (Signed)
"  I was told to go to the ED for N/V.  I did last Monday was given phenergan and a sublingual pill,  Yesterday, I started vomiting again and I am taking these medicines.  Do I have an Ulcer.  I do not think it's food poisoning because my kids and family ate the same foods and I am the only one N/V.  Had a tubal ligation and am not pregnant.  Test show menopause for me."  Return number [phone]. " Advised she contact Dr. Collene Mares about this as well.

## 2016-04-15 ENCOUNTER — Telehealth: Payer: Self-pay | Admitting: *Deleted

## 2016-04-15 ENCOUNTER — Other Ambulatory Visit: Payer: Self-pay | Admitting: *Deleted

## 2016-04-15 MED ORDER — FAMOTIDINE 20 MG PO TABS: 20.0000 mg | ORAL_TABLET | Freq: Two times a day (BID) | ORAL | 1 refills | Status: DC

## 2016-04-15 NOTE — Telephone Encounter (Signed)
This RN spoke with pt per KUB with normal results as well as update post diet recommendations yesterday.  Ashley Tyler states she is feeling a lot better at present.  Discussed need to continue BRAT type diet - and will initiate pepcid bid for stomach discomfort as well. Discussed possible gut changes post completing chemo and recommendations regarding symptom management and allowing body to regulate itself.  No other needs at this time.   Chelsa knows to call if needed.

## 2016-04-18 ENCOUNTER — Ambulatory Visit: Payer: 59 | Admitting: Radiation Oncology

## 2016-04-23 NOTE — Progress Notes (Signed)
Photon Boost Complex Emergency planning/management officer Note  Diagnosis: Breast Cancer  Breast cancer of upper-outer quadrant of left female breast (Ashley Tyler) 174.4 C50.412    Outpatient  The patient's CT images from her simulation were reviewed to plan her boost treatment to her left breast  lumpectomy cavity.  The boost to the lumpectomy cavity will be delivered with 2 photon fields using MLCs for custom blocks again heart and lungs.  This constitutes 2 complex treatment devices. Isodose plan was reviewed and approved. 10 Gy in 5 fractions prescribed.  -----------------------------------  [name], MD

## 2016-05-07 ENCOUNTER — Telehealth: Payer: Self-pay | Admitting: *Deleted

## 2016-05-07 MED ORDER — METOCLOPRAMIDE HCL 10 MG PO TABS: 10.0000 mg | ORAL_TABLET | Freq: Three times a day (TID) | ORAL | 1 refills | Status: DC | PRN

## 2016-05-07 MED ORDER — VENLAFAXINE HCL ER 37.5 MG PO CP24: 37.5000 mg | ORAL_CAPSULE | ORAL | 1 refills | Status: DC

## 2016-05-07 NOTE — Telephone Encounter (Signed)
This RN spoke with pt per her call stating she was stopping effexor due to concern it was causing her intermittant nausea and vomiting.  Per call [name] stopped the effexor on Sunday.  She states she has nausea and headache this am - " so now I do not know what is causing this "  Above discussed with recommendation for pt to taper off effexor for least side effects - tapering instructions given :  Decrease dose to 37.5 mg daily x 1 week, Then 37.5 mg every other day x 1 week -  37.5 mg every 3rd day x 1 week and then may stop.  Per ongoing gastric issues - with pt currently in training for a new job position and unable to take time off - reglan will be called in .  Ashley Tyler will call per above therapy with update.

## 2016-05-12 ENCOUNTER — Ambulatory Visit (HOSPITAL_BASED_OUTPATIENT_CLINIC_OR_DEPARTMENT_OTHER): Payer: 59 | Admitting: Adult Health

## 2016-05-12 ENCOUNTER — Encounter: Payer: Self-pay | Admitting: Adult Health

## 2016-05-12 VITALS — BP 119/80 | HR 80 | Temp 97.8°F | Resp 18 | Ht 65.0 in | Wt 172.6 lb

## 2016-05-12 DIAGNOSIS — L658 Other specified nonscarring hair loss: Secondary | ICD-10-CM | POA: Diagnosis not present

## 2016-05-12 DIAGNOSIS — C50412 Malignant neoplasm of upper-outer quadrant of left female breast: Secondary | ICD-10-CM

## 2016-05-12 DIAGNOSIS — Z170 Estrogen receptor positive status [ER+]: Secondary | ICD-10-CM | POA: Diagnosis not present

## 2016-05-12 DIAGNOSIS — Z79810 Long term (current) use of selective estrogen receptor modulators (SERMs): Secondary | ICD-10-CM

## 2016-05-12 DIAGNOSIS — R232 Flushing: Secondary | ICD-10-CM

## 2016-05-12 DIAGNOSIS — T451X5A Adverse effect of antineoplastic and immunosuppressive drugs, initial encounter: Secondary | ICD-10-CM

## 2016-05-12 MED ORDER — GABAPENTIN 300 MG PO CAPS: 300.0000 mg | ORAL_CAPSULE | Freq: Every day | ORAL | 11 refills | Status: DC

## 2016-05-12 NOTE — Progress Notes (Signed)
CLINIC:  Survivorship   REASON FOR VISIT:  Routine follow-up post-treatment for a recent history of breast cancer.  BRIEF ONCOLOGIC HISTORY:    Breast cancer of upper-outer quadrant of left female breast ([name])   08/03/2015 Initial Biopsy    (L) breast biopsy (1:00): IDC, grade 3.  ER+ (60%), PR+ (20%), Ki67 70%, HER2 neg (ratio 1.18).       08/28/2015 Surgery    (L) lumpectomy with SLNB ([facility]): IDC, grade 3, spanning 2.1 cm, intermediate grade DCIS. 0/3 SLN.    pT2, pN0: Stage IIA       08/28/2015 Oncotype testing    Recurrence score: 22; ROR 15% (intermediate risk)       10/11/2015 - 12/13/2015 Adjuvant Chemotherapy    Taxotere/Cytoxan x 1 cycle (Taxotere discontinued d/t elevated LFTs). Continued with Adriamycin/Cytoxan x 3 cycles.       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 Genes analyzed: ATM, BARD1, BRCA1, BRCA2, BRIP1, CDH1, CHEK2, EPCAM, FANCC, MLH1, MSH2, MSH6, NBN, PALB2, PMS2, PTEN, RAD51C, RAD51D, TP53, XRCC2.       01/21/2016 - 03/03/2016 Radiation Therapy    Adjuvant breast radiation Isidore Moos). Left Breast / 50 Gy in 25 fractions.  Left Breast Boost / 10 Gy in 5 fractions      05/2016 -  Anti-estrogen oral therapy    Tamoxifen 20 mg daily. Consideration for PALLAS trial        INTERVAL HISTORY:  Ashley Tyler presents to the [facility] today for our initial meeting to review her survivorship care plan detailing her treatment course for breast cancer, as well as monitoring long-term side effects of that treatment, education regarding health maintenance, screening, and overall wellness and health promotion.     Overall, Ashley Tyler reports feeling pretty well since completing her radiation therapy approximately 2 months ago.  She started on the Tamoxifen nearly 2 weeks ago. Thus far, she is having severe hot flashes, particularly at bedtime.  She is  waking up in drenching sweats several times per week.  She isn't sleeping well as a result.  She was having hot flashes prior to starting the Tamoxifen,   She is continuing to work on weaning down and off of her Effexor XR, which was previously prescribed for her by Dr. [name], but she thought this medication was giving her significant nausea & vomiting. The N&V has resolved with the Reglan she is taking around-the-clock.    She is concerned about how slow her hair is growing back after chemo.  It is growing back very thin and this is distressing for her.   She is training for a new job right now; she will continue to work for [employer] and with her new position she will still be able to work quite a bit from home.  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   Otherwise, she is doing well and is largely without other complaints.     REVIEW OF SYSTEMS:  Review of Systems  Constitutional: Negative.   HENT:  Negative.   Eyes: Negative.   Respiratory: Negative.   Cardiovascular: Negative.   Endocrine: Positive for hot flashes (mostly at night ).  Genitourinary: Negative.    Musculoskeletal: Negative.   Skin: Negative.   Neurological: Negative.   Hematological: Negative.   Psychiatric/Behavioral: Positive for sleep disturbance.  Breast: Denies any new nodularity,  masses, nipple changes, or nipple discharge.    A 14-point review of systems was completed and was negative, except as noted above.   ONCOLOGY TREATMENT TEAM:  1. Surgeon:  Dr. Saddie Benders at [facility] 2. Medical Oncologist: Dr. [name] 3. Radiation Oncologist: Dr. Isidore Moos    PAST MEDICAL/SURGICAL HISTORY:  Past Medical History:  Diagnosis Date  . Cancer ([place])    left breast  . Chronic constipation   . Endometriosis   . Family history of breast cancer   . Family history of prostate cancer   . Headache   . Migraines    Past Surgical History:  Procedure Laterality  Date  . BREAST LUMPECTOMY WITH RADIOACTIVE SEED AND SENTINEL LYMPH NODE BIOPSY Left 08/28/2015   Procedure: LEFT BREAST LUMPECTOMY WITH RADIOACTIVE SEED AND SENTINEL LYMPH NODE BIOPSY;  Surgeon: [name], MD;  Location: [facility];  Service: General;  Laterality: Left;  . CESAREAN SECTION    . LAPAROSCOPIC SALPINGO OOPHERECTOMY Right   . TONSILLECTOMY       ALLERGIES:  Allergies  Allergen Reactions  . Tetracyclines & Related Itching  . Other Rash    Powder in gloves causes rash     CURRENT MEDICATIONS:  Outpatient Encounter Prescriptions as of 05/12/2016  Medication Sig  . metoCLOPramide (REGLAN) 10 MG tablet Take 1 tablet (10 mg total) by mouth every 8 (eight) hours as needed for nausea.  . non-metallic deodorant Jethro Poling) MISC Apply 1 application topically daily as needed.  . ondansetron (ZOFRAN ODT) 4 MG disintegrating tablet Take 1 tablet (4 mg total) by mouth every 8 (eight) hours as needed for nausea or vomiting.  . prochlorperazine (COMPAZINE) 5 MG tablet Take 5 mg by mouth every 6 (six) hours as needed for nausea or vomiting.  . promethazine (PHENERGAN) 25 MG suppository Place 1 suppository (25 mg total) rectally every 6 (six) hours as needed for nausea or vomiting.  . venlafaxine XR (EFFEXOR-XR) 37.5 MG 24 hr capsule Take 1 capsule (37.5 mg total) by mouth as directed.  . gabapentin (NEURONTIN) 300 MG capsule Take 1 capsule (300 mg total) by mouth at bedtime.  . [DISCONTINUED] famotidine (PEPCID) 20 MG tablet Take 1 tablet (20 mg total) by mouth 2 (two) times daily. (Patient not taking: Reported on 05/12/2016)  . [DISCONTINUED] hyaluronate sodium (RADIAPLEXRX) GEL Apply 1 application topically once.   No facility-administered encounter medications on file as of 05/12/2016.      ONCOLOGIC FAMILY HISTORY:  Family History  Problem Relation Age of Onset  . Prostate cancer Father 107    Gleason = 7  . Prostate cancer Maternal Uncle     dx in his 4s  .  Breast cancer Paternal Aunt     dx in her 34s  . Prostate cancer Paternal Uncle   . Alzheimer's disease Maternal Grandmother   . Lung cancer Maternal Grandfather   . Congestive Heart Failure Paternal Grandmother   . Prostate cancer Maternal Uncle     dx in his 26s  . Breast cancer Paternal Aunt     dx in her 60s; maternal half paternal aunt  . Prostate cancer Paternal Uncle     dx in his 9s  . Breast cancer Cousin   . 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 [name], thought to be a benign finding after testing patient's mother]. Genes analyzed: ATM, BARD1,  BRCA1, BRCA2, BRIP1, CDH1, CHEK2, EPCAM, FANCC, MLH1, MSH2, MSH6, NBN, PALB2, PMS2, PTEN, RAD51C, RAD51D, TP53, XRCC2.    SOCIAL HISTORY:  Ashley Tyler is divorced and lives in [place], [place].  She has 2 daughters.  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  She denies any current tobacco or illicit drug use. She drinks alcohol occasionally.    PHYSICAL EXAMINATION:  Vital Signs:   Vitals:   05/12/16 0832  BP: 119/80  Pulse: 80  Resp: 18  Temp: 97.8 F (36.6 C)   Filed Weights   05/12/16 0832  Weight: 172 lb 9.6 oz (78.3 kg)   General: Well-nourished, well-appearing female in no acute distress.  She is unaccompanied today.   HEENT: Head is normocephalic.  Pupils equal and reactive to light. Conjunctivae clear without exudate.  Sclerae anicteric. Oral mucosa is pink, moist.  Oropharynx is pink without lesions or erythema.  Lymph: No cervical, supraclavicular, or infraclavicular lymphadenopathy noted on palpation.  Cardiovascular: Regular rate and rhythm.[name] Respiratory: Clear to auscultation bilaterally. Chest expansion symmetric; breathing non-labored.  GI:  Abdomen soft and round; non-tender, non-distended. Bowel sounds normoactive.  GU: Deferred.  Neuro: No focal deficits. Steady gait.  Psych: Mood and affect normal and appropriate for situation.  Extremities: No edema. Skin: Warm and dry.  LABORATORY DATA:  None for this visit.  DIAGNOSTIC IMAGING:  None for this visit.      ASSESSMENT AND PLAN:  Ashley Tyler is a pleasant 50 y.o. female with Stage IIA left breast invasive ductal carcinoma, ER+/PR+/HER2-, diagnosed in 08/2015; treated with lumpectomy, adjuvant chemotherapy with Taxotere/Cytoxan x 1 cycle (Taxotere discontinued after cycle #1 d/t increased liver enzymes); she went on to complete 3 cycles of Adriamycin/Cytoxan; completed chemotherapy on 12/13/15.  She completed adjuvant breast radiation therapy on 03/03/16 and started anti-estrogen therapy with Tamoxifen in 05/2016.  She presents to the Survivorship Clinic for our initial meeting and routine follow-up post-completion of treatment for breast cancer.    1. Stage IIA left breast cancer:  Ashley Tyler is continuing to recover from definitive treatment for breast cancer. She will follow-up with her medical oncologist, Dr. [name], in 06/2016 with history and physical exam per surveillance protocol.  She will continue her anti-estrogen therapy with Tamoxifen. Thus far, she is tolerating the medication relatively well, with hot flashes/night sweats being her biggest complaint (see #2 below).  Today, a comprehensive survivorship care plan and treatment summary was reviewed with the patient today detailing her breast cancer diagnosis, treatment course, potential late/long-term effects of treatment, appropriate follow-up care with recommendations for the future, and patient education resources.  A copy of this summary, along with a letter will be sent to the patient's primary care provider via mail/fax/In Basket message after today's visit.    2. Hot flashes secondary to Tamoxifen:  Ashley Tyler was previously taking Effexor XR for hot flashes, which was providing her great benefit but may have also been contributing to nausea & vomiting.  Therefore, the patient prefers to wean off of the Effexor.  We discussed restarting the gabapentin with once daily dosing at bedtime to help with the hot flashes/night sweats.  The gabapentin would also make her sleepy and may help with her associated sleep disturbance as well.  She tells me that she previously tolerated the gabapentin well and she isn't sure if it helped her hot flashes or not because she thinks she was  started on the Effexor around the same time.  She felt like the 300 mg dose taken at bedtime left her feeling a little groggy in the morning.  This was when she had a different work schedule and was having to wake up much earlier.  Her new position allows her to sleep in a little more in the mornings and she would like to retry the gabapentin.  Therefore, I have e-prescribed Gabapentin 300 mg po QHS today.  Encouraged her to let us know if this medication was ineffective, as there are possibly other options that may help with her hot flashes/night sweats.  She will complete her wean off of the Effexor in about 2 weeks.    3. Hair loss secondary to chemotherapy: Her hair has started coming back after chemotherapy, but she is distressed by how slow the growth rate has been.  We discussed that her hair will continue to grow, but it does take several months.  She could certainly try taking Biotin supplements over-the-counter, which have shown mixed reviews in helping with hair & nail growth in patients after chemotherapy.  Biotin is generally tolerated well with largely no side effects and is safe.  Encouraged her to take the standard dose of 1000 mcg daily, if she chooses to give the supplement a try.    4. At risk for lymphedema: Ashley Tyler is at risk for lymphedema s/p sentinel lymph node biopsy. We discussed that certainly  her risk is much lower than women who have had axillary lymph node dissection, but prevention of lymphedema is best practice.  Therefore, I provided her with a paper prescription for a left arm lymphedema compression sleeve that she should wear when flying and with vigorous activities.    5. Healthy eating after cancer: She is very interested in learning more about healthy eating tips after cancer treatments.  I provided a flyer for our healthy eating class, which is a 6-week course facilitated by a Registered Dietitian. Gave her instructions on who to contact to get registered if she chooses to participate.   6. Bone health:  Given Ashley Tyler's age & history of breast cancer, she is at risk for bone demineralization.  She understands that Tamoxifen often has the positive side effect of increasing bone density, when compared to aromatase inhibitors. Her last DEXA scan was 12/28/15, which showed osteopenia.  She was encouraged to increase her consumption of foods rich in calcium, as well as increase her weight-bearing activities.  She was given education on specific activities to promote bone health.  7. Cancer screening:  Due to Ashley Tyler's history and her age, she should receive screening for skin cancers, colon cancer, and gynecologic cancers.  The information and recommendations are listed on the patient's comprehensive care plan/treatment summary and were reviewed in detail with the patient.    8. Health maintenance and wellness promotion: Ashley Tyler was encouraged to consume 5-7 servings of fruits and vegetables per day. We reviewed the "Nutrition Rainbow" handout, as well as the handout "Take Control of Your Health and Reduce Your Cancer Risk" from the [facility].  She was also encouraged to engage in moderate to vigorous exercise for 30 minutes per day most days of the week. We discussed the [employer] fitness program, which is designed for cancer survivors to  help them become more physically fit after cancer treatments.  She was instructed to limit her alcohol consumption and continue to abstain from tobacco use.   9. Support services/counseling:  It is not uncommon for this period of the patient's cancer care trajectory to be one of many emotions and stressors.  We discussed an opportunity for her to participate in the next session of [facility] ("Finding Your New Normal") support group series designed for patients after they have completed treatment.   Ashley Tyler was encouraged to take advantage of our many other support services programs, support groups, and/or counseling in coping with her new life as a cancer survivor after completing anti-cancer treatment.  She was offered support today through active listening and expressive supportive counseling.  She was given information regarding our available services and encouraged to contact me with any questions or for help enrolling in any of our support group/programs.    Dispo:   -Return to cancer center to see Dr. [name] in 06/2016 -She is welcome to return back to the Survivorship Clinic at any time; no additional follow-up needed at this time.  -Consider referral back to survivorship as a long-term survivor for continued surveillance   A total of 60 minutes of face-to-face time was spent with this patient with greater than 50% of that time in counseling and care-coordination.   Mike Craze, NP Survivorship Program [facility] [phone]   Note: PRIMARY CARE PROVIDER [name], [place] [phone]

## 2016-05-13 ENCOUNTER — Encounter: Payer: Self-pay | Admitting: Radiation Oncology

## 2016-05-16 ENCOUNTER — Encounter: Payer: Self-pay | Admitting: Radiation Oncology

## 2016-05-16 ENCOUNTER — Ambulatory Visit
Admission: RE | Admit: 2016-05-16 | Discharge: 2016-05-16 | Disposition: A | Discharge status: Home / Self Care | Payer: 59 | Source: Ambulatory Visit | Attending: Radiation Oncology | Admitting: Radiation Oncology

## 2016-05-16 DIAGNOSIS — C50412 Malignant neoplasm of upper-outer quadrant of left female breast: Secondary | ICD-10-CM | POA: Diagnosis not present

## 2016-05-16 DIAGNOSIS — Z923 Personal history of irradiation: Secondary | ICD-10-CM | POA: Insufficient documentation

## 2016-05-16 HISTORY — DX: Personal history of irradiation: Z92.3

## 2016-05-16 NOTE — Progress Notes (Signed)
Radiation Oncology         [phone] ________________________________  Name: Ashley Tyler MRN: [id number]  Date: 05/16/2016  DOB: [birth year]  Follow-Up Visit Note  Outpatient  CC: Ashley Shelling, MD  Ashley Seltzer, MD  Diagnosis and Prior Radiotherapy:    ICD-9-CM ICD-10-CM   1. Malignant neoplasm of upper-outer quadrant of left female breast, unspecified estrogen receptor status ([place]) 174.4 C50.412    She received 50 Gy to the Left breast and a Left breast boost of 10 Gy. Completed on 03/03/2016.  CHIEF COMPLAINT: Here for follow-up and surveillance of left breast cancer  Narrative:  The patient returns today for routine follow-up of radiation completed 03/03/2016 to her left breast. She is feeling well overall. She denies pain or fatigue. She started Tamoxifen on 05/02/2016. She does have questions about whether she should take a baby aspirin because of the risk of blood clots. She reports the skin to her left breast has healed. She is using Vitamin E oil to her left breast. She does have hyperpigmentation, but it has decreased since completing treatment. She is scheduled to see Dr. Jana Tyler again on 06/25/2016.                               ALLERGIES:  is allergic to tetracyclines & related and other.  Meds: Current Outpatient Prescriptions  Medication Sig Dispense Refill  . gabapentin (NEURONTIN) 300 MG capsule Take 1 capsule (300 mg total) by mouth at bedtime. 30 capsule 11  . metoCLOPramide (REGLAN) 10 MG tablet Take 1 tablet (10 mg total) by mouth every 8 (eight) hours as needed for nausea. 90 tablet 1  . non-metallic deodorant (ALRA) MISC Apply 1 application topically daily as needed.    . tamoxifen (NOLVADEX) 20 MG tablet Take 20 mg by mouth daily.    [name] venlafaxine XR (EFFEXOR-XR) 37.5 MG 24 hr capsule Take 1 capsule (37.5 mg total) by mouth as directed. 30 capsule 1  . ondansetron (ZOFRAN ODT) 4 MG disintegrating tablet Take 1 tablet (4 mg total) by  mouth every 8 (eight) hours as needed for nausea or vomiting. (Patient not taking: Reported on 05/16/2016) 10 tablet 0  . prochlorperazine (COMPAZINE) 5 MG tablet Take 5 mg by mouth every 6 (six) hours as needed for nausea or vomiting.    . promethazine (PHENERGAN) 25 MG suppository Place 1 suppository (25 mg total) rectally every 6 (six) hours as needed for nausea or vomiting. (Patient not taking: Reported on 05/16/2016) 12 each 0   No current facility-administered medications for this encounter.     Physical Findings:  height is 5\' 5"  (1.651 m) and weight is 171 lb 3.2 oz (77.7 kg). Her temperature is 97.7 F (36.5 C). Her blood pressure is 130/85 and her pulse is 68. Her oxygen saturation is 100%. .     General: Alert and oriented, in no acute distress HEENT: Head is normocephalic.  resolved alopecia Neurologic: Speech is fluent. Coordination is intact. Psychiatric: Judgment and insight are intact. Affect is appropriate. Breast: Very minimal hyperpigmentation remaining over Left breast and axilla. Has healed very well.  Lab Findings: Lab Results  Component Value Date   WBC 5.2 04/07/2016   HGB 14.5 04/07/2016   HCT 43.8 04/07/2016   MCV 84.2 04/07/2016   PLT 263 04/07/2016    Radiographic Findings: No results found.  Impression/Plan:  Healed well from RT. I encouraged her to continue with yearly mammography and  followup with medical oncology. I will see her back on an as-needed basis. I have encouraged her to call if she has any issues or concerns in the future. I wished her the very best.  She is scheduled to follow up in medical oncology with Dr. Jana Tyler on 06/25/2016.  She will ask him about ASA use.  I do not recommended it at this time, defer to him.      Ashley Gibson, MD   This document serves as a record of services personally performed by Ashley Gibson, MD. It was created on her behalf by Ashley Tyler, a trained medical scribe. The creation of this record is based  on the scribe's personal observations and the provider's statements to them. This document has been checked and approved by the attending provider.

## 2016-05-16 NOTE — Telephone Encounter (Signed)
NO ENTRY 

## 2016-05-16 NOTE — Progress Notes (Signed)
Ashley Tyler presents for follow up of radiation completed 03/03/16 to her Left Breast. She denies pain or fatigue. She started Tamoxifen on 05/02/16. She does have questions about whether she should take a baby aspirin because of the risk of blood clots. She reports the skin to her Left Breast has healed. She is using Vitamin E cream to her Left Breast. She does have hyperpigmentation present, but it has decreased since completing treatment.   BP 130/85   Pulse 68   Temp 97.7 F (36.5 C)   Ht 5\' 5"  (1.651 m)   Wt 171 lb 3.2 oz (77.7 kg)   SpO2 100% Comment: room air  BMI 28.49 kg/m    Wt Readings from Last 3 Encounters:  05/16/16 171 lb 3.2 oz (77.7 kg)  05/12/16 172 lb 9.6 oz (78.3 kg)  03/25/16 175 lb 9.6 oz (79.7 kg)

## 2016-06-06 ENCOUNTER — Other Ambulatory Visit: Payer: Self-pay | Admitting: *Deleted

## 2016-06-06 MED ORDER — TAMOXIFEN CITRATE 20 MG PO TABS: 20.0000 mg | ORAL_TABLET | Freq: Every day | ORAL | 11 refills | Status: DC

## 2016-06-10 ENCOUNTER — Other Ambulatory Visit: Payer: Self-pay | Admitting: *Deleted

## 2016-06-10 MED ORDER — TAMOXIFEN CITRATE 20 MG PO TABS: 20.0000 mg | ORAL_TABLET | Freq: Every day | ORAL | 3 refills | Status: DC

## 2016-06-10 NOTE — Telephone Encounter (Signed)
Message from pt stating her mail order pharmacy has been trying to contact this office for Tamoxifen script. Noted it was recently refilled to [facility]. Left message informing pt that script was forwarded to [facility] today per her request.

## 2016-06-16 ENCOUNTER — Other Ambulatory Visit (HOSPITAL_BASED_OUTPATIENT_CLINIC_OR_DEPARTMENT_OTHER): Payer: 59

## 2016-06-16 DIAGNOSIS — C50412 Malignant neoplasm of upper-outer quadrant of left female breast: Secondary | ICD-10-CM

## 2016-06-16 LAB — COMPREHENSIVE METABOLIC PANEL
ALT: 26 U/L (ref 0–55)
AST: 27 U/L (ref 5–34)
Albumin: 3.6 g/dL (ref 3.5–5.0)
Alkaline Phosphatase: 74 U/L (ref 40–150)
Anion Gap: 8 mEq/L (ref 3–11)
BUN: 17.3 mg/dL (ref 7.0–26.0)
CHLORIDE: 109 meq/L (ref 98–109)
CO2: 25 meq/L (ref 22–29)
CREATININE: 0.9 mg/dL (ref 0.6–1.1)
Calcium: 9.2 mg/dL (ref 8.4–10.4)
EGFR: 83 mL/min/{1.73_m2} — ABNORMAL LOW (ref >90)
GLUCOSE: 108 mg/dL (ref 70–140)
Potassium: 4.4 mEq/L (ref 3.5–5.1)
Sodium: 142 mEq/L (ref 136–145)
Total Bilirubin: 0.52 mg/dL (ref 0.20–1.20)
Total Protein: 6.6 g/dL (ref 6.4–8.3)

## 2016-06-16 LAB — CBC WITH DIFFERENTIAL/PLATELET
BASO%: 0.2 % (ref 0.0–2.0)
Basophils Absolute: 0 10*3/uL (ref 0.0–0.1)
EOS%: 1.1 % (ref 0.0–7.0)
Eosinophils Absolute: 0.1 10*3/uL (ref 0.0–0.5)
HEMATOCRIT: 36.4 % (ref 34.8–46.6)
HGB: 11.9 g/dL (ref 11.6–15.9)
LYMPH#: 1.7 10*3/uL (ref 0.9–3.3)
LYMPH%: 37.3 % (ref 14.0–49.7)
MCH: 27.5 pg (ref 25.1–34.0)
MCHC: 32.7 g/dL (ref 31.5–36.0)
MCV: 84.1 fL (ref 79.5–101.0)
MONO#: 0.3 10*3/uL (ref 0.1–0.9)
MONO%: 7 % (ref 0.0–14.0)
NEUT#: 2.5 10*3/uL (ref 1.5–6.5)
NEUT%: 54.4 % (ref 38.4–76.8)
Platelets: 224 10*3/uL (ref 145–400)
RBC: 4.33 10*6/uL (ref 3.70–5.45)
RDW: 14.7 % — ABNORMAL HIGH (ref 11.2–14.5)
WBC: 4.6 10*3/uL (ref 3.9–10.3)

## 2016-06-18 ENCOUNTER — Other Ambulatory Visit: Payer: 59

## 2016-06-25 ENCOUNTER — Ambulatory Visit (HOSPITAL_BASED_OUTPATIENT_CLINIC_OR_DEPARTMENT_OTHER): Payer: 59 | Admitting: Oncology

## 2016-06-25 VITALS — BP 126/76 | HR 113 | Temp 97.4°F | Resp 18 | Ht 65.0 in | Wt 167.1 lb

## 2016-06-25 DIAGNOSIS — Z170 Estrogen receptor positive status [ER+]: Secondary | ICD-10-CM

## 2016-06-25 DIAGNOSIS — R232 Flushing: Secondary | ICD-10-CM

## 2016-06-25 DIAGNOSIS — G4700 Insomnia, unspecified: Secondary | ICD-10-CM | POA: Diagnosis not present

## 2016-06-25 DIAGNOSIS — N951 Menopausal and female climacteric states: Secondary | ICD-10-CM

## 2016-06-25 DIAGNOSIS — F39 Unspecified mood [affective] disorder: Secondary | ICD-10-CM

## 2016-06-25 DIAGNOSIS — C50412 Malignant neoplasm of upper-outer quadrant of left female breast: Secondary | ICD-10-CM

## 2016-06-25 DIAGNOSIS — T451X5A Adverse effect of antineoplastic and immunosuppressive drugs, initial encounter: Secondary | ICD-10-CM

## 2016-06-25 DIAGNOSIS — Z79811 Long term (current) use of aromatase inhibitors: Secondary | ICD-10-CM

## 2016-06-25 MED ORDER — SERTRALINE HCL 50 MG PO TABS: 50.0000 mg | ORAL_TABLET | Freq: Every day | ORAL | 4 refills | Status: DC

## 2016-06-25 MED ORDER — GABAPENTIN 300 MG PO CAPS: 600.0000 mg | ORAL_CAPSULE | Freq: Every day | ORAL | 11 refills | Status: DC

## 2016-06-25 NOTE — Progress Notes (Signed)
Preparing myself am sorry to her more sore and your  [place]  [phone] [phone]   ID: Ashley Tyler DOB: 1965-10-30  MR#: [id number]  QKM#:[id number]  Patient Care Team: Ashley Orn, MD as PCP - General (Internal Medicine) Ashley Cruel, MD as Consulting Physician (Oncology) Ashley Seltzer, MD as Consulting Physician (General Surgery) Ashley Gibson, MD as Attending Physician (Radiation Oncology) PCP: Ashley Shelling, MD GYN: OTHER MD:  CHIEF COMPLAINT: Estrogen receptor positive breast cancer  CURRENT TREATMENT: Tamoxifen  BREAST CANCER HISTORY: From the original intake note:  [name] had screening mammography at Dr. Caralee Tyler office 07/24/2015 suggesting a left breast mass and the patient was referred to the [facility] where on 07/30/2015 she underwent left diagnostic mammography with tomosynthesis and left breast ultrasonography. The breast density was category C. In the upper outer quadrant of the left breast there was a 2.3 cm spiculated mass. There were no associated calcifications. This mass was palpable at the 1:00 position 5 cm from the nipple. Ultrasound confirmed an irregular hypoechoic mass measuring 1.8 cm. Ultrasonography of the left axilla was negative.  On 08/03/2015 the patient underwent biopsy of the left breast mass in question. 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  The patient then met with surgery and after appropriate discussion she proceeded to left lumpectomy and sentinel lymph node sampling 08/28/2015. The final pathology ([id number]) showed invasive ductal carcinoma, grade 3, measuring 2.1 cm. Margins were close but negative. All 3 sentinel lymph nodes were  clear. Repeat HER-2 was again negative, with a signals ratio of 0.9, and number per cell 2.20.  INTERVAL HISTORY: Ashley Tyler returns today for follow-up of her estrogen receptor positive breast cancer. She started tamoxifen in December 2017. She is having significant problems with hot flashes. She hasn't had vaginal discharge problems. She obtains a drug at no cost.  REVIEW OF SYSTEMS: Ashley Tyler as a new job and it is very demanding. In addition she was unable to tolerate the venlafaxine, which had been very helpful to her, and have to tapered to off. As soon as she did the hot flashes came back with a vengeance. She is now unable to sleep partly because of hot flashes and partly because she just can't sleep. She also is very moody and cries very easily. She is having some family issues which she doesn't feel capable of dealing with although generally she would be she says. Her last menstruation was May 2017, shortly after starting chemotherapy. A detailed review of systems today was otherwise stable.   PAST MEDICAL HISTORY: Past Medical History:  Diagnosis Date  . Cancer ([place])    left breast  . Chronic constipation   . Endometriosis   . Family history of breast cancer   . Family history of prostate cancer   . Headache   . History of radiation therapy 01/21/16- 03/03/16   Left breast/ 50 Gy in 25 fractions. Left Breast Boost/ 10 Gy in 5 Fractions.  . Migraines     PAST SURGICAL HISTORY: Past Surgical History:  Procedure Laterality Date  . BREAST LUMPECTOMY WITH RADIOACTIVE SEED AND SENTINEL LYMPH NODE BIOPSY Left 08/28/2015   Procedure: LEFT BREAST LUMPECTOMY WITH RADIOACTIVE SEED AND SENTINEL LYMPH NODE BIOPSY;  Surgeon: Ashley Seltzer, MD;  Location:  SURGERY  CENTER;  Service: General;  Laterality: Left;  . CESAREAN SECTION    . LAPAROSCOPIC SALPINGO OOPHERECTOMY Right   . TONSILLECTOMY      FAMILY HISTORY Family History  Problem Relation Age of Onset  . Prostate cancer  Father 64    Gleason = 7  . Prostate cancer Maternal Uncle     dx in his 57s  . Breast cancer Paternal Aunt     dx in her 22s  . Prostate cancer Paternal Uncle   . Alzheimer's disease Maternal Grandmother   . Lung cancer Maternal Grandfather   . Congestive Heart Failure Paternal Grandmother   . Prostate cancer Maternal Uncle     dx in his 17s  . Breast cancer Paternal Aunt     dx in her 57s; maternal half paternal aunt  . Prostate cancer Paternal Uncle     dx in his 89s  . Breast cancer Cousin   . Breast cancer Cousin     father's maternal cousin's daughter; dx in her 38s-30s  The patient's parents are still living, in their early 12s as of April 2016. The patient is an only child. On her father's side there are 4 cases of breast cancer, 2 aunts diagnosed in their 46s and 21s, and 2 cousins diagnosed in her 36s and one at age 69. The patient's father had prostate cancer diagnosed age 62.  GYNECOLOGIC HISTORY:  No LMP recorded. Patient is not currently having periods (Reason: Chemotherapy). Menarche age 69, first live birth age 4. The patient is GX P2, although both children were born early. She is status post right salpingo-oophorectomy due to endometriosis. 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 At home is just she and her daughter Ashley Tyler, 34. Her other daughter, Ashley Tyler, is studying criminal justice at [employer]. Her SO is this is with. Ashley Tyler    ADVANCED DIRECTIVES: Not in place   HEALTH MAINTENANCE: Social History  Substance Use Topics  . Smoking status: Never Smoker  . Smokeless tobacco: Not on file  . Alcohol use Yes     Comment: social     Colonoscopy:  PAP:  Bone density:  Lipid panel:  Allergies  Allergen Reactions  . Tetracyclines & Related Itching  . Other Rash    Powder in gloves causes rash    Current  Outpatient Prescriptions  Medication Sig Dispense Refill  . gabapentin (NEURONTIN) 300 MG capsule Take 2 capsules (600 mg total) by mouth at bedtime. 60 capsule 11  . metoCLOPramide (REGLAN) 10 MG tablet Take 1 tablet (10 mg total) by mouth every 8 (eight) hours as needed for nausea. 90 tablet 1  . prochlorperazine (COMPAZINE) 5 MG tablet Take 5 mg by mouth every 6 (six) hours as needed for nausea or vomiting.    . promethazine (PHENERGAN) 25 MG suppository Place 1 suppository (25 mg total) rectally every 6 (six) hours as needed for nausea or vomiting. (Patient not taking: Reported on 05/16/2016) 12 each 0  . sertraline (ZOLOFT) 50 MG tablet Take 1 tablet (50 mg total) by mouth daily. 90 tablet 4  . tamoxifen (NOLVADEX) 20 MG tablet Take 1 tablet (20 mg total) by mouth daily. 90 tablet 3   No current facility-administered medications for this visit.     OBJECTIVE: Middle-aged African-American womanIn no acute distress Vitals:   06/25/16 1615  BP: 126/76  Pulse: [name])  113  Resp: 18  Temp: 97.4 F (36.3 C)     Body mass index is 27.81 kg/m.    ECOG FS:1 - Symptomatic but completely ambulatory  Sclerae unicteric, pupils round and equal Oropharynx clear and moist-- no thrush or other lesions No cervical or supraclavicular adenopathy Lungs no rales or rhonchi Heart regular rate and rhythm Abd soft, nontender, positive bowel sounds MSK no focal spinal tenderness, no upper extremity lymphedema Neuro: nonfocal, well oriented, appropriate affect Breasts: The right breast is benign. The left breast is status post colectomy and radiation. The cosmetic result is good. The hyperpigmentation is rapidly vanishing. Detailed review of systems today was otherwise stable.  LAB RESULTS:  CMP     Component Value Date/Time   NA 142 06/16/2016 0754   K 4.4 06/16/2016 0754   CL 106 04/07/2016 1847   CO2 25 06/16/2016 0754   GLUCOSE 108 06/16/2016 0754   BUN 17.3 06/16/2016 0754   CREATININE 0.9  06/16/2016 0754   CALCIUM 9.2 06/16/2016 0754   PROT 6.6 06/16/2016 0754   ALBUMIN 3.6 06/16/2016 0754   AST 27 06/16/2016 0754   ALT 26 06/16/2016 0754   ALKPHOS 74 06/16/2016 0754   BILITOT 0.52 06/16/2016 0754   GFRNONAA >60 04/07/2016 1847   GFRAA >60 04/07/2016 1847    INo results found for: SPEP, UPEP  Lab Results  Component Value Date   WBC 4.6 06/16/2016   NEUTROABS 2.5 06/16/2016   HGB 11.9 06/16/2016   HCT 36.4 06/16/2016   MCV 84.1 06/16/2016   PLT 224 06/16/2016      Chemistry      Component Value Date/Time   NA 142 06/16/2016 0754   K 4.4 06/16/2016 0754   CL 106 04/07/2016 1847   CO2 25 06/16/2016 0754   BUN 17.3 06/16/2016 0754   CREATININE 0.9 06/16/2016 0754      Component Value Date/Time   CALCIUM 9.2 06/16/2016 0754   ALKPHOS 74 06/16/2016 0754   AST 27 06/16/2016 0754   ALT 26 06/16/2016 0754   BILITOT 0.52 06/16/2016 0754       No results found for: LABCA2  No components found for: LABCA125  No results for input(s): INR in the last 168 hours.  Urinalysis    Component Value Date/Time   COLORURINE YELLOW 04/07/2016 2007   APPEARANCEUR CLOUDY (A) 04/07/2016 2007   LABSPEC 1.028 04/07/2016 2007   LABSPEC 1.015 10/17/2015 1102   PHURINE 8.5 (H) 04/07/2016 2007   GLUCOSEU NEGATIVE 04/07/2016 2007   GLUCOSEU Negative 10/17/2015 1102   HGBUR NEGATIVE 04/07/2016 2007   BILIRUBINUR NEGATIVE 04/07/2016 2007   BILIRUBINUR Negative 10/17/2015 1102   KETONESUR 40 (A) 04/07/2016 2007   PROTEINUR 30 (A) 04/07/2016 2007   UROBILINOGEN 0.2 10/17/2015 1102   NITRITE NEGATIVE 04/07/2016 2007   LEUKOCYTESUR TRACE (A) 04/07/2016 2007   LEUKOCYTESUR Negative 10/17/2015 1102      ELIGIBLE FOR AVAILABLE RESEARCH PROTOCOL: PALLAS  STUDIES:No results found.   ASSESSMENT: 51 y.o. [place] woman status post left breast upper outer quadrant biopsy 08/03/2015 for a clinical T2 N0, stage IIa invasive ductal carcinoma, grade 3, estrogen and  progesterone receptor positive, HER-2 not amplified, with an MIB-1 of 70%  (1) status post left lumpectomy and sentinel lymph node sampling 08/28/2015 for a pT2 pN0, stage IIA invasive ductal carcinoma, grade 3, with close but negative margins. Repeat HER-2 was again negative  (a) oncotype DX Score of 22 predicts a risk of recurrence outside the breast  within 10 years of 14% if the patient's only systemic therapy is tamoxifen for 5 years. The addition of CAF reduced the risk an additional 4 %  (2) adjuvant chemotherapy consisting of cyclophosphamide and  docetaxel started 10/11/2015  (a) changed to cyclophosphamide and doxorubicin for 3 cycles, after first cycle of TC because of elevated LFTs, completed 12/13/2015  (3) adjuvant radiation 01/21/2016-03/03/2016   (4) started tamoxifen 05/02/2016  (a) bone density 12/20/2015 shows mild osteopenia, with a T score of -1.5.  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 Unfortunately she developed nausea and vomiting related to this and she tapered to off.  She is now in full menopausal discomfort, unable to sleep, with troublesome hot flashes daytime and nighttime and with mood swings. This is making it difficult for her to deal with her family and her work particular now that her work as her stressful.  We discussed the interactions between tamoxifen and antidepressants and she will give sertraline a try. We are going to start at 50 mg a day but if she doesn't find any benefit within a couple of weeks we will go to 100 mg.  We're going to increase the gabapentin to 600  mg at bedtime. She will let me know if this makes her sleepy in the morning.  I strongly encouraged her to begin a regular exercise program.  She has signed up for the old finding your new normal" program and I think that will also greatly benefit her.  I'm going to see her again in approximately 6 weeks. If everything is more stable at that time I will brought in the follow-up interval.   Ashley Cruel, MD   06/25/2016 4:54 PM

## 2016-07-07 ENCOUNTER — Other Ambulatory Visit: Payer: Self-pay | Admitting: *Deleted

## 2016-07-07 MED ORDER — TAMOXIFEN CITRATE 20 MG PO TABS: 20.0000 mg | ORAL_TABLET | Freq: Every day | ORAL | 0 refills | Status: DC

## 2016-07-07 NOTE — Telephone Encounter (Signed)
Message left by pt requesting a local fill for tamoxifen due to new onset of " palpatations " after obtaining tamoxifen thru new mail order pharmacy.  " my symptoms started the same day I started taking the tamoxifen I got thru them "  " I contacted the mail order pharmacy and told them - and I want to [facility] from [facility] "  [facility] to [facility].  Message left on VM of pt per above.

## 2016-07-18 ENCOUNTER — Telehealth: Payer: Self-pay

## 2016-07-18 NOTE — Telephone Encounter (Signed)
Pt called stating she is still having palpitations, heart fluttering and a mild pressure in her left arm when taking tamoxifen. [facility] and back to [facility], thinking the pharmacy switch made a difference. She did not take tamoxifen last night and feels a lot better than she has in last 3 weeks.   Instructed her to hold tamoxifen for 1 week and then to restart. Asked her to call us with update when restart.  If symptoms worsen in any way, to go to ER to R/O MI.

## 2016-07-24 ENCOUNTER — Ambulatory Visit
Admission: RE | Admit: 2016-07-24 | Discharge: 2016-07-24 | Disposition: A | Discharge status: Home / Self Care | Payer: 59 | Source: Ambulatory Visit | Attending: Oncology | Admitting: Oncology

## 2016-07-24 ENCOUNTER — Telehealth: Payer: Self-pay | Admitting: *Deleted

## 2016-07-24 DIAGNOSIS — R232 Flushing: Secondary | ICD-10-CM

## 2016-07-24 DIAGNOSIS — Z170 Estrogen receptor positive status [ER+]: Principal | ICD-10-CM

## 2016-07-24 DIAGNOSIS — C50412 Malignant neoplasm of upper-outer quadrant of left female breast: Secondary | ICD-10-CM

## 2016-07-24 DIAGNOSIS — T451X5A Adverse effect of antineoplastic and immunosuppressive drugs, initial encounter: Secondary | ICD-10-CM

## 2016-07-24 HISTORY — DX: Personal history of irradiation: Z92.3

## 2016-07-24 HISTORY — DX: Personal history of antineoplastic chemotherapy: Z92.21

## 2016-07-24 NOTE — Telephone Encounter (Signed)
Pt called and left message re:  Pt had stopped Tamoxifen last Thursday as instructed due to heart palpitations.  However, pt still experiences heart palpitations today, and has not restarted Tamoxifen yet.  Pt wanted to know what Dr. [name] would recommend.   Attempted to call pt back without answer.  Left message on voice mail requesting a call back to triage nurse for further information. Pt's    Phone    [phone].

## 2016-07-24 NOTE — Telephone Encounter (Signed)
Return call received from patient requesting collaborative nurse. "She called instructing me to continue to hold Tamoxifen but needs more information and more instructions.  I've never seen a cardiologist.  I saw my PCP yesterday my B/P was normal but my heart rate = 95 which is new for me.  Never experienced this fluttering or palpitations before in my life.  I was on Adriamycin which I know affects the heart so I'm concerned.  The palpitaions have improved but have not completely gone away.  No fainting or feeling light headed when I experience these heart palpitations.  Started Tamoxifen in December."   This nurse also instructed her not to take tamoxifen again.  We will return call to complete further evaluation or intervention.

## 2016-07-25 ENCOUNTER — Telehealth: Payer: Self-pay | Admitting: *Deleted

## 2016-07-25 ENCOUNTER — Telehealth: Payer: Self-pay | Admitting: Oncology

## 2016-07-25 NOTE — Telephone Encounter (Signed)
See prior note

## 2016-07-25 NOTE — Telephone Encounter (Signed)
Left a message on VM about the appointment change and our number for any questions

## 2016-07-25 NOTE — Telephone Encounter (Signed)
This RN was able to speak with pt ( note pt and nurse were leaving VM's with each other ).  Ashley Tyler states she overall feels less palpitations off the tamoxifen " but I still feel them "  Palpitations are not triggered by activity.  Per known history of breast cancer with use of cardiotoxic drugs and need for pt to be on an antiestrogen plan is to bring pt in for visit by NP with EKG for evaluation and possible referral for further evaluation.

## 2016-08-05 ENCOUNTER — Telehealth: Payer: Self-pay | Admitting: *Deleted

## 2016-08-05 ENCOUNTER — Other Ambulatory Visit: Payer: Self-pay | Admitting: *Deleted

## 2016-08-05 NOTE — Telephone Encounter (Signed)
This RN spoke with pt per concern for ongoing palpitations she feels is associated with tamoxifen use.  She may need further referrals and does not see a primary MD at this time.  appt made for EKG and appointment with LC/NP.

## 2016-08-06 ENCOUNTER — Encounter: Payer: Self-pay | Admitting: Adult Health

## 2016-08-06 ENCOUNTER — Ambulatory Visit: Payer: 59 | Admitting: Adult Health

## 2016-08-06 ENCOUNTER — Telehealth (HOSPITAL_COMMUNITY): Payer: Self-pay | Admitting: *Deleted

## 2016-08-06 ENCOUNTER — Other Ambulatory Visit: Payer: 59

## 2016-08-06 ENCOUNTER — Ambulatory Visit (HOSPITAL_BASED_OUTPATIENT_CLINIC_OR_DEPARTMENT_OTHER): Payer: 59 | Admitting: Adult Health

## 2016-08-06 ENCOUNTER — Ambulatory Visit: Payer: 59 | Admitting: Oncology

## 2016-08-06 VITALS — BP 132/72 | HR 86 | Temp 97.8°F | Resp 18 | Ht 65.0 in | Wt 162.0 lb

## 2016-08-06 DIAGNOSIS — C50412 Malignant neoplasm of upper-outer quadrant of left female breast: Secondary | ICD-10-CM | POA: Diagnosis not present

## 2016-08-06 DIAGNOSIS — Z170 Estrogen receptor positive status [ER+]: Secondary | ICD-10-CM | POA: Diagnosis not present

## 2016-08-06 DIAGNOSIS — R002 Palpitations: Secondary | ICD-10-CM

## 2016-08-06 DIAGNOSIS — R000 Tachycardia, unspecified: Secondary | ICD-10-CM

## 2016-08-06 DIAGNOSIS — M8580 Other specified disorders of bone density and structure, unspecified site: Secondary | ICD-10-CM

## 2016-08-06 DIAGNOSIS — Z79810 Long term (current) use of selective estrogen receptor modulators (SERMs): Secondary | ICD-10-CM | POA: Diagnosis not present

## 2016-08-06 NOTE — Progress Notes (Signed)
Preparing myself am sorry to her more sore and your  [place]  [phone] [phone]   ID: Ashley Tyler Tyler DOB: 09/23/1965  MR#: [id number]  DGU#:[id number]  Patient Care Team: Ashley Tyler Orn, MD as PCP - General (Internal Medicine) Ashley Tyler Cruel, MD as Consulting Physician (Oncology) Ashley Tyler Seltzer, MD as Consulting Physician (General Surgery) Ashley Gibson, MD as Attending Physician (Radiation Oncology) PCP: Ashley Shelling, MD GYN: OTHER MD:  CHIEF COMPLAINT: Estrogen receptor positive breast cancer  CURRENT TREATMENT: Tamoxifen  BREAST CANCER HISTORY: From the original intake note:  [name] had screening mammography at Dr. Caralee Ates office 07/24/2015 suggesting a left breast mass and the patient was referred to the [facility] where on 07/30/2015 she underwent left diagnostic mammography with tomosynthesis and left breast ultrasonography. The breast density was category C. In the upper outer quadrant of the left breast there was a 2.3 cm spiculated mass. There were no associated calcifications. This mass was palpable at the 1:00 position 5 cm from the nipple. Ultrasound confirmed an irregular hypoechoic mass measuring 1.8 cm. Ultrasonography of the left axilla was negative.  On 08/03/2015 the patient underwent biopsy of the left breast mass in question. The pathology ([id number]) showed an invasive ductal carcinoma, grade 3, estrogen receptor 60% positive, progesterone receptor 20% positive, both with strong staining intensity, with an MIB-1 of 70%, and HER-2 nonamplified, with a signals ratio of 1.18 and the number Purcell 2.00.  The patient then met with surgery and after appropriate discussion she proceeded to left lumpectomy and sentinel lymph node sampling 08/28/2015. The final pathology ([id number]) showed invasive ductal carcinoma, grade 3, measuring 2.1 cm. Margins were close but negative. All 3 sentinel lymph nodes were  clear. Repeat HER-2 was again negative, with a signals ratio of 0.9, and number per cell 2.20.  INTERVAL HISTORY: Ashley Tyler Tyler returns today for follow-up of her estrogen receptor positive breast cancer. She started tamoxifen in December 2017. She is very concerned due to having palpitations daily, about 10-15 times per day.  These have been going on for about 6 weeks.  These palpitations are intermittent with no specific pattern without the day.  They last for varying amounts of time, 10 minutes at the longest.  No alleviating or aggravating factors.  These palpitations occur with varying activities, while sitting down, while working, while lying down, or walking around.  Shortness of breath occurs when she has these.  She also experiences some chest pressure over the left side of her chest when this happens as well.  She has stopped all of her medications.  She drinks minimal caffeine.  She drinks more water than she ever has.  She is also c/o swelling in her ankles x 3 weeks.  She is increasingly stressed out with work and other stressors.  She did receive 3 cycles of Ashley Tyler Tyler and Ashley Tyler Tyler from 11/15/2015 through 12/13/2015.  Last echocardiogram on 11/14/2015 LVEF 60-65%.    REVIEW OF SYSTEMS: She denies fevers, chills, cough, DOE, orthopnea, abdominal pain, nausea and vomiting.    PAST MEDICAL HISTORY: Past Medical History:  Diagnosis Date  . Cancer ([place])    left breast  . Chronic constipation   . Endometriosis   . Family history of breast cancer   . Family history of prostate cancer   . Headache   . History of radiation therapy 01/21/16- 03/03/16   Left breast/ 50 Gy in 25 fractions. Left Breast Boost/ 10 Gy in 5 Fractions.  . Migraines   .  Personal history of chemotherapy   . Personal history of radiation therapy     PAST SURGICAL HISTORY: Past Surgical History:  Procedure Laterality Date  . BREAST LUMPECTOMY WITH RADIOACTIVE SEED AND SENTINEL LYMPH NODE BIOPSY Left 08/28/2015    Procedure: LEFT BREAST LUMPECTOMY WITH RADIOACTIVE SEED AND SENTINEL LYMPH NODE BIOPSY;  Surgeon: Ashley Tyler Seltzer, MD;  Location: [place];  Service: General;  Laterality: Left;  . CESAREAN SECTION    . EXCISION / BIOPSY BREAST / NIPPLE / DUCT Left 08/28/2015  . LAPAROSCOPIC SALPINGO OOPHERECTOMY Right   . TONSILLECTOMY      FAMILY HISTORY Family History  Problem Relation Age of Onset  . Prostate cancer Father 26    Gleason = 7  . Prostate cancer Maternal Uncle     dx in his 38s  . Breast cancer Paternal Aunt     dx in her 5s  . Prostate cancer Paternal Uncle   . Alzheimer's disease Maternal Grandmother   . Lung cancer Maternal Grandfather   . Congestive Heart Failure Paternal Grandmother   . Prostate cancer Maternal Uncle     dx in his 59s  . Breast cancer Paternal Aunt     dx in her 30s; maternal half paternal aunt  . Prostate cancer Paternal Uncle     dx in his 73s  . Breast cancer Cousin   . Breast cancer Cousin     father's maternal cousin's daughter; dx in her 51s-30s  The patient's parents are still living, in their early 60s as of April 2016. The patient is an only child. On her father's side there are 4 cases of breast cancer, 2 aunts diagnosed in their 42s and 41s, and 2 cousins diagnosed in her 58s and one at age 24. The patient's father had prostate cancer diagnosed age 51.  GYNECOLOGIC HISTORY:  No LMP recorded. Patient is not currently having periods (Reason: Chemotherapy). Menarche age 62, first live birth age 45. The patient is GX P2, although both children were born early. She is status post right salpingo-oophorectomy due to endometriosis. 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 She is divorced. At home is just she and her daughter Ashley Tyler Tyler, 22. Her other daughter, Ashley Tyler Tyler, is studying criminal justice at [employer]. Her SO is this is  with. Ashley Tyler Tyler    ADVANCED DIRECTIVES: Not in place   HEALTH MAINTENANCE: Social History  Substance Use Topics  . Smoking status: Never Smoker  . Smokeless tobacco: Not on file  . Alcohol use Yes     Comment: social     Colonoscopy:  PAP:  Bone density:  Lipid panel:  Allergies  Allergen Reactions  . Tetracyclines & Related Itching  . Other Rash    Powder in gloves causes rash    Current Outpatient Prescriptions  Medication Sig Dispense Refill  . gabapentin (NEURONTIN) 300 MG capsule Take 2 capsules (600 mg total) by mouth at bedtime. 60 capsule 11  . metoCLOPramide (REGLAN) 10 MG tablet Take 1 tablet (10 mg total) by mouth every 8 (eight) hours as needed for nausea. 90 tablet 1  . prochlorperazine (COMPAZINE) 5 MG tablet Take 5 mg by mouth every 6 (six) hours as needed for nausea or vomiting.    . promethazine (PHENERGAN) 25 MG suppository Place 1 suppository (25 mg total) rectally every 6 (six) hours as needed for nausea or vomiting. (Patient  not taking: Reported on 05/16/2016) 12 each 0  . sertraline (ZOLOFT) 50 MG tablet Take 1 tablet (50 mg total) by mouth daily. 90 tablet 4  . tamoxifen (NOLVADEX) 20 MG tablet Take 1 tablet (20 mg total) by mouth daily. 90 tablet 0   No current facility-administered medications for this visit.     OBJECTIVE:  Vitals:   08/06/16 0857  BP: 132/72  Pulse: 86  Resp: 18  Temp: 97.8 F (36.6 C)     Body mass index is 26.96 kg/m.    ECOG FS:1 - Symptomatic but completely ambulatory HR on 02/21/2016: 72, HR on 05/12/2016: 82 GENERAL: Patient is a well appearing female in no acute distress HEENT:  Sclerae anicteric.  Oropharynx clear and moist. No ulcerations or evidence of oropharyngeal candidiasis. Neck is supple.  NODES:  No cervical, supraclavicular, infraclavicular or axillary lymphadenopathy palpated.  BREAST EXAM:  Deferred. LUNGS:  Clear to auscultation bilaterally.  No wheezes or rhonchi. HEART:  Regular rate,  slightly tachcardic rhythm. No murmur appreciated. ABDOMEN:  Soft, nontender.  Positive, normoactive bowel sounds. No organomegaly palpated. MSK:  No focal spinal tenderness to palpation.  EXTREMITIES:  Bilateral ankle edema noted.   SKIN:  Clear with no obvious rashes or skin changes. NEURO:  Nonfocal. Well oriented.  Appropriate affect.    LAB RESULTS:  CMP     Component Value Date/Time   NA 142 06/16/2016 0754   K 4.4 06/16/2016 0754   CL 106 04/07/2016 1847   CO2 25 06/16/2016 0754   GLUCOSE 108 06/16/2016 0754   BUN 17.3 06/16/2016 0754   CREATININE 0.9 06/16/2016 0754   CALCIUM 9.2 06/16/2016 0754   PROT 6.6 06/16/2016 0754   ALBUMIN 3.6 06/16/2016 0754   AST 27 06/16/2016 0754   ALT 26 06/16/2016 0754   ALKPHOS 74 06/16/2016 0754   BILITOT 0.52 06/16/2016 0754   GFRNONAA >60 04/07/2016 1847   GFRAA >60 04/07/2016 1847    INo results found for: SPEP, UPEP  Lab Results  Component Value Date   WBC 4.6 06/16/2016   NEUTROABS 2.5 06/16/2016   HGB 11.9 06/16/2016   HCT 36.4 06/16/2016   MCV 84.1 06/16/2016   PLT 224 06/16/2016      Chemistry      Component Value Date/Time   NA 142 06/16/2016 0754   K 4.4 06/16/2016 0754   CL 106 04/07/2016 1847   CO2 25 06/16/2016 0754   BUN 17.3 06/16/2016 0754   CREATININE 0.9 06/16/2016 0754      Component Value Date/Time   CALCIUM 9.2 06/16/2016 0754   ALKPHOS 74 06/16/2016 0754   AST 27 06/16/2016 0754   ALT 26 06/16/2016 0754   BILITOT 0.52 06/16/2016 0754       No results found for: LABCA2  No components found for: LABCA125  No results for input(s): INR in the last 168 hours.  Urinalysis    Component Value Date/Time   COLORURINE YELLOW 04/07/2016 2007   APPEARANCEUR CLOUDY (A) 04/07/2016 2007   LABSPEC 1.028 04/07/2016 2007   LABSPEC 1.015 10/17/2015 1102   PHURINE 8.5 (H) 04/07/2016 2007   GLUCOSEU NEGATIVE 04/07/2016 2007   GLUCOSEU Negative 10/17/2015 1102   HGBUR NEGATIVE 04/07/2016 2007    BILIRUBINUR NEGATIVE 04/07/2016 2007   BILIRUBINUR Negative 10/17/2015 1102   KETONESUR 40 (A) 04/07/2016 2007   PROTEINUR 30 (A) 04/07/2016 2007   UROBILINOGEN 0.2 10/17/2015 1102   NITRITE NEGATIVE 04/07/2016 2007   LEUKOCYTESUR TRACE (A) 04/07/2016 2007  LEUKOCYTESUR Negative 10/17/2015 1102      ELIGIBLE FOR AVAILABLE RESEARCH PROTOCOL: PALLAS  STUDIES:Mm Diag Breast Tomo Bilateral  Result Date: 07/24/2016 CLINICAL DATA:  51 year old female presenting for annual evaluation status post left breast lumpectomy in 2017. EXAM: 2D DIGITAL DIAGNOSTIC BILATERAL MAMMOGRAM WITH CAD AND ADJUNCT TOMO COMPARISON:  Previous exam(s). ACR Breast Density Category c: The breast tissue is heterogeneously dense, which may obscure small masses. FINDINGS: Interval surgical changes in the upper-outer quadrant of the left breast consistent with the patient's history of lumpectomy. No suspicious calcifications, masses or areas of distortion are seen in the bilateral breasts. Mammographic images were processed with CAD. IMPRESSION: Interval surgical changes in the left breast consistent with history of lumpectomy. No mammographic evidence of malignancy in the bilateral breasts. RECOMMENDATION: Diagnostic mammogram is suggested in 1 year. (Code:[id number]-B-01Y) I have discussed the findings and recommendations with the patient. Results were also provided in writing at the conclusion of the visit. If applicable, a reminder letter will be sent to the patient regarding the next appointment. BI-RADS CATEGORY  2: Benign. Electronically Signed   By: Ammie Ferrier M.D.   On: 07/24/2016 16:02     ASSESSMENT: 51 y.o. [place] woman status post left breast upper outer quadrant biopsy 08/03/2015 for a clinical T2 N0, stage IIa invasive ductal carcinoma, grade 3, estrogen and progesterone receptor positive, HER-2 not amplified, with an MIB-1 of 70%  (1) status post left lumpectomy and sentinel lymph node sampling 08/28/2015 for a  pT2 pN0, stage IIA invasive ductal carcinoma, grade 3, with close but negative margins. Repeat HER-2 was again negative  (a) oncotype DX Score of 22 predicts a risk of recurrence outside the breast within 10 years of 14% if the patient's only systemic therapy is tamoxifen for 5 years. The addition of CAF reduced the risk an additional 4 %  (2) adjuvant chemotherapy consisting of Ashley Tyler Tyler and  docetaxel started 10/11/2015  (a) changed to Ashley Tyler Tyler and Ashley Tyler Tyler for 3 cycles, after first cycle of TC because of elevated LFTs, completed 12/13/2015  (3) adjuvant radiation 01/21/2016-03/03/2016   (4) started tamoxifen 05/02/2016  (a) bone density 12/20/2015 shows mild osteopenia, with a T score of -1.5.  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  Her EKG in clinic today shows sinus tachycardia with a rate at 109.  She however was not experiencing palpitations when the EKG was performed.  Her symptoms are distressing to her.  She needs to see Dr. Aundra Dubin in cardiology who has experience with patients who have received Ashley Tyler Tyler.  Depending on how quickly she can get in to see him, I may go ahead and prescribe a beta blocker.  She will stay off of her medications for now.  Instructed her to limit the sodium in her diet to 2-2.5 gm per day until she sees cardiology and can get more instruction from them.  We discussed stress reduction, and that this may be anxiety related, however, that, anxiety was a diagnosis of exclusion.  Counseled patient about red flags of when to go to emergency room.    All questions were answered.  Patient is in agreement with  above plan, and knows to call for any questions  or concerns between now and her next appointment.    A total of (30) minutes of face-to-face time was spent with this patient with greater than 50% of that time in counseling and care-coordination.    Charlestine Massed, NP   08/06/2016 9:27 AM

## 2016-08-06 NOTE — Telephone Encounter (Signed)
Received referral from cancer center pt has received Doxorubicin and is having symptomatic palps,  Per Dr Aundra Dubin ok to schedule asap, pt will need echo prior to appt.  Order placed will get pt scheduled

## 2016-08-07 ENCOUNTER — Ambulatory Visit (HOSPITAL_COMMUNITY)
Admission: RE | Admit: 2016-08-07 | Discharge: 2016-08-07 | Disposition: A | Discharge status: Home / Self Care | Payer: 59 | Source: Ambulatory Visit | Attending: Internal Medicine | Admitting: Internal Medicine

## 2016-08-07 ENCOUNTER — Ambulatory Visit (HOSPITAL_BASED_OUTPATIENT_CLINIC_OR_DEPARTMENT_OTHER)
Admission: RE | Admit: 2016-08-07 | Discharge: 2016-08-07 | Disposition: A | Discharge status: Home / Self Care | Payer: 59 | Source: Ambulatory Visit | Attending: Cardiology | Admitting: Cardiology

## 2016-08-07 ENCOUNTER — Encounter (HOSPITAL_COMMUNITY): Payer: Self-pay

## 2016-08-07 VITALS — BP 116/78 | HR 90 | Wt 163.2 lb

## 2016-08-07 DIAGNOSIS — I493 Ventricular premature depolarization: Secondary | ICD-10-CM | POA: Diagnosis not present

## 2016-08-07 DIAGNOSIS — Z9221 Personal history of antineoplastic chemotherapy: Secondary | ICD-10-CM | POA: Diagnosis not present

## 2016-08-07 DIAGNOSIS — E0590 Thyrotoxicosis, unspecified without thyrotoxic crisis or storm: Secondary | ICD-10-CM

## 2016-08-07 DIAGNOSIS — C50412 Malignant neoplasm of upper-outer quadrant of left female breast: Secondary | ICD-10-CM | POA: Diagnosis not present

## 2016-08-07 DIAGNOSIS — Z853 Personal history of malignant neoplasm of breast: Secondary | ICD-10-CM | POA: Diagnosis not present

## 2016-08-07 DIAGNOSIS — R002 Palpitations: Secondary | ICD-10-CM

## 2016-08-07 DIAGNOSIS — I071 Rheumatic tricuspid insufficiency: Secondary | ICD-10-CM | POA: Insufficient documentation

## 2016-08-07 DIAGNOSIS — Z803 Family history of malignant neoplasm of breast: Secondary | ICD-10-CM | POA: Diagnosis not present

## 2016-08-07 DIAGNOSIS — I2510 Atherosclerotic heart disease of native coronary artery without angina pectoris: Secondary | ICD-10-CM | POA: Diagnosis not present

## 2016-08-07 DIAGNOSIS — N809 Endometriosis, unspecified: Secondary | ICD-10-CM | POA: Diagnosis not present

## 2016-08-07 LAB — BASIC METABOLIC PANEL
Anion gap: 6 (ref 5–15)
BUN: 14 mg/dL (ref 6–20)
CALCIUM: 9.4 mg/dL (ref 8.9–10.3)
CO2: 25 mmol/L (ref 22–32)
CREATININE: 0.67 mg/dL (ref 0.44–1.00)
Chloride: 109 mmol/L (ref 101–111)
GFR calc Af Amer: >60 mL/min (ref >60)
GLUCOSE: 99 mg/dL (ref 65–99)
Potassium: 3.7 mmol/L (ref 3.5–5.1)
Sodium: 140 mmol/L (ref 135–145)

## 2016-08-07 LAB — TSH: TSH: <0.01 u[IU]/mL — ABNORMAL LOW (ref 0.350–4.500)

## 2016-08-07 LAB — MAGNESIUM: Magnesium: 1.7 mg/dL (ref 1.7–2.4)

## 2016-08-07 NOTE — Progress Notes (Signed)
Oncology: Dr. [name] PCP: Dr. Lavone Orn  51 yo with history of breast cancer presents for evaluation of chest heaviness and palpitations.  Patient was diagnosed with breast cancer in 3/17 and had lumpectomy in 3/17 followed by 3 courses of Adriamycin and Cytoxan.  Echo in 6/17 showed no significant abnormalities.  Echo done today was reviewed, it also was normal.   Patient was sent for evaluation because of palpitations and fluttering in her chest that have gone on for about a month now.  She stopped Tamoxifen because of this, to no effect.  She reports starting a new job a couple of months ago with increased stress.  She does not drink much caffeine.  No new meds other than Tamoxifen (which she is now off).  PVCs noted on echocardiogram telemetry today.  She notices the palpitations every day.  She also gets chest pressure, which seems to be associated with the palpitations.  No exertional chest pain/pressure.  No DOE.    ECG (3/18, personally reviewed): sinus tachy at 109, o/w normal  Labs (1/18): K 4.4, creatinine 0.9 Labs (3/18): TSH < 0.010  PMH: 1. Endometriosis. 2. Breast cancer: Diagnosed in 3/17 on left.  ER+/PR+/HER-.  Lumpectomy 3/17.  She had 3 cycles of doxorubicin and Cytoxan to 6/17.  She was on tamoxifen but is now off.   - Echo (6/17): EF 60-65%, normal RV size and systolic function.  - Echo (3/18): EF 60-65%, normal RV size and systolic function, GLS -09.9%.   Social History   Social History  . Marital status: Divorced    Spouse name: N/A  . Number of children: 2  . Years of education: N/A   Occupational History  . Not on file.   Social History Main Topics  . Smoking status: Never Smoker  . Smokeless tobacco: Never Used  . Alcohol use Yes     Comment: social  . Drug use: No  . Sexual activity: Yes    Birth control/ protection: Surgical   Other Topics Concern  . Not on file   Social History Narrative  . No narrative on file   Family History  Problem  Relation Age of Onset  . Prostate cancer Father 50    Gleason = 7  . Prostate cancer Maternal Uncle     dx in his 62s  . Breast cancer Paternal Aunt     dx in her 53s  . Prostate cancer Paternal Uncle   . Alzheimer's disease Maternal Grandmother   . Lung cancer Maternal Grandfather   . Congestive Heart Failure Paternal Grandmother   . Prostate cancer Maternal Uncle     dx in his 87s  . Breast cancer Paternal Aunt     dx in her 17s; maternal half paternal aunt  . Prostate cancer Paternal Uncle     dx in his 72s  . Breast cancer Cousin   . Breast cancer Cousin     father's maternal cousin's daughter; dx in her 20s-30s   ROS: All systems reviewed negative except as per HPI.   Current Outpatient Prescriptions  Medication Sig Dispense Refill  . metoCLOPramide (REGLAN) 10 MG tablet Take 1 tablet (10 mg total) by mouth every 8 (eight) hours as needed for nausea. (Patient not taking: Reported on 08/07/2016) 90 tablet 1  . prochlorperazine (COMPAZINE) 5 MG tablet Take 5 mg by mouth every 6 (six) hours as needed for nausea or vomiting.    . promethazine (PHENERGAN) 25 MG suppository Place 1 suppository (25  mg total) rectally every 6 (six) hours as needed for nausea or vomiting. (Patient not taking: Reported on 05/16/2016) 12 each 0   No current facility-administered medications for this encounter.    BP 116/78   Pulse 90   Wt 163 lb 4 oz (74 kg)   SpO2 100%   BMI 27.17 kg/m  General: NAD Neck: No JVD, diffusely enlarged thyroid.   Lungs: Clear to auscultation bilaterally with normal respiratory effort. CV: Nondisplaced PMI.  Heart regular S1/S2, no S3/S4, no murmur.  No peripheral edema.  No carotid bruit.  Normal pedal pulses.  Abdomen: Soft, nontender, no hepatosplenomegaly, no distention.  Skin: Intact without lesions or rashes.  Neurologic: Alert and oriented x 3.  Psych: Normal affect. Extremities: No clubbing or cyanosis.  HEENT: Normal.   Assessment/Plan: 1. Breast cancer:  Patient had Adriamycin-containing chemotherapy in summer 2017.  Echo done today was reviewed, showed no evidence for cardiotoxicity.  2. Palpitations: Multiple PVCs seen on telemetry with echocardiogram.  I suspect that her palpitations are coming from PVCs.  Interestingly, she appears to be hyperthyroid based on TSH drawn today.  If that is the case, she will likely need treatment of hyperthyroidism to resolve her palpitations.  She may need a beta blocker until hyperthyroid treatment is successful.  - 48 hour holter to look for any atrial fibrillation.   - I offered her a beta blocker for symptomatic relief, but she wants to hold off for now.  - Check BMET/Mg.  2. Hyperthyroidism: Diffusely enlarged thyroid on exam, suppressed TSH.  ?Graves disease.  - I will have her return to check free T4 and free T3. - Refer to endocrinology for treatment.  3. Chest pressure: Atypical, seems to be related to palpitations.  Doubt due to coronary disease.    Loralie Champagne 08/08/2016

## 2016-08-07 NOTE — Progress Notes (Signed)
  Echocardiogram 2D Echocardiogram has been performed.  Donata Clay 08/07/2016, 11:39 AM

## 2016-08-07 NOTE — Patient Instructions (Signed)
Labs today  Your physician has recommended that you wear a holter monitor. Holter monitors are medical devices that record the heart's electrical activity. Doctors most often use these monitors to diagnose arrhythmias. Arrhythmias are problems with the speed or rhythm of the heartbeat. The monitor is a small, portable device. You can wear one while you do your normal daily activities. This is usually used to diagnose what is causing palpitations/syncope (passing out).  Your physician recommends that you schedule a follow-up appointment in: 3 weeks

## 2016-08-08 ENCOUNTER — Ambulatory Visit (HOSPITAL_COMMUNITY)
Admission: RE | Admit: 2016-08-08 | Discharge: 2016-08-08 | Disposition: A | Discharge status: Home / Self Care | Payer: 59 | Source: Ambulatory Visit | Attending: Cardiology | Admitting: Cardiology

## 2016-08-08 ENCOUNTER — Telehealth (HOSPITAL_COMMUNITY): Payer: Self-pay | Admitting: *Deleted

## 2016-08-08 DIAGNOSIS — E0590 Thyrotoxicosis, unspecified without thyrotoxic crisis or storm: Secondary | ICD-10-CM | POA: Diagnosis not present

## 2016-08-08 LAB — T4, FREE: FREE T4: 2.5 ng/dL — AB (ref 0.61–1.12)

## 2016-08-08 LAB — TSH

## 2016-08-08 NOTE — Telephone Encounter (Signed)
TSH  Order: [id number]  Status:  Final result Visible to patient:  No (Not Released) Dx:  Palpitation  Notes Recorded by Kennieth Rad, RN on 08/08/2016 at 8:43 AM EST Spoke with patient and she is agreeable with plan. Appt made for today, lab orders placed, and referral ordered. ------  Notes Recorded by Scarlette Calico, RN on 08/07/2016 at 4:20 PM EST Left message to call back ------  Notes Recorded by Larey Dresser, MD on 08/07/2016 at 2:26 PM EST TSH is low, she is probably hyperthyroid, explaining her palpitations. Would draw free T3 and free T4 as well as repeat TSH and refer to endocrinology, Dr. Cruzita Lederer.

## 2016-08-09 LAB — T3, FREE: T3, Free: 7.3 pg/mL — ABNORMAL HIGH (ref 2.0–4.4)

## 2016-08-12 ENCOUNTER — Telehealth (HOSPITAL_COMMUNITY): Payer: Self-pay | Admitting: *Deleted

## 2016-08-12 NOTE — Telephone Encounter (Signed)
Patient called in asking about her lab results from 08/08/16.  Results given to patient over the phone and she has no further questions at this time.  She stated she has made an appointment with her endocrinologist.

## 2016-08-13 ENCOUNTER — Telehealth: Payer: Self-pay | Admitting: Oncology

## 2016-08-13 NOTE — Telephone Encounter (Signed)
Patient called and wanted to cancel her appointment on 3/16 and reschedule for the end of April or anything after 4/18 due to other dr appointment   [phone] call back

## 2016-08-15 ENCOUNTER — Ambulatory Visit: Payer: 59 | Admitting: Oncology

## 2016-08-15 ENCOUNTER — Other Ambulatory Visit: Payer: 59

## 2016-08-18 ENCOUNTER — Telehealth: Payer: Self-pay | Admitting: Oncology

## 2016-08-18 NOTE — Telephone Encounter (Signed)
Confirmed r/s appt with pt in April per telephone encounter

## 2016-08-28 ENCOUNTER — Encounter (HOSPITAL_COMMUNITY): Payer: 59

## 2016-09-17 ENCOUNTER — Encounter: Payer: Self-pay | Admitting: Internal Medicine

## 2016-09-17 ENCOUNTER — Ambulatory Visit (INDEPENDENT_AMBULATORY_CARE_PROVIDER_SITE_OTHER): Payer: 59 | Admitting: Internal Medicine

## 2016-09-17 VITALS — BP 118/64 | HR 68 | Temp 98.6°F | Resp 12 | Ht 65.5 in | Wt 165.6 lb

## 2016-09-17 DIAGNOSIS — E063 Autoimmune thyroiditis: Secondary | ICD-10-CM | POA: Insufficient documentation

## 2016-09-17 DIAGNOSIS — E0590 Thyrotoxicosis, unspecified without thyrotoxic crisis or storm: Secondary | ICD-10-CM | POA: Diagnosis not present

## 2016-09-17 LAB — T3, FREE: T3 FREE: 3.5 pg/mL (ref 2.3–4.2)

## 2016-09-17 LAB — TSH: TSH: 0.26 u[IU]/mL — AB (ref 0.35–4.50)

## 2016-09-17 LAB — T4, FREE: Free T4: 0.72 ng/dL (ref 0.60–1.60)

## 2016-09-17 NOTE — Patient Instructions (Addendum)
Please stop at the lab.  We may need to start Methimazole depending on the labs.  Please stop the Methimazole and call us or your primary care doctor if you develop: - sore throat - fever - yellow skin - dark urine - light colored stools As we will then need to check your blood counts and liver tests.  Please return in 3-4 months.

## 2016-09-17 NOTE — Progress Notes (Addendum)
Patient ID: Ashley Tyler, female   DOB: [birth year], 51 y.o.   MRN: [id number]    HPI  Ashley Tyler is a 51 y.o.-year-old female, referred by her cardiologist, Dr. Aundra Dubin, for evaluation for thyrotoxicosis.  She has a h/o fatigue, thinning hair, anxiety, hypopigmentation of skin in 2016 >> saw a holistic Dr. At the time >> TFTs abnormal >> Turmeric, Selenium, Iodine, vitamin D.She started these >> no change in sxs. 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 Started Tamoxifen in 05/2017.  She started to have palpitations in 07/2016 >> saw Dr. Aundra Dubin (>> 2D Echo normal, but PVCs) >> he checked her TFTs >> abnormal.   I reviewed pt's thyroid tests: Lab Results  Component Value Date   TSH <0.010 (L) 08/08/2016   TSH <0.010 (L) 08/07/2016   FREET4 2.50 (H) 08/08/2016   T3FREE 7.3 (H) 08/08/2016   Pt denies feeling nodules in neck, hoarseness, but has dysphagia, no odynophagia, no SOB with lying down; she also c/o: - + fatigue - + excessive sweating/heat intolerance - no tremors - + anxiety - + palpitations - no hyperdefecation, + constipation - + weight loss (20 lbs in 1 year) - + hair loss and thinning  Pt does have a FH of thyroid ds.: MGM - goiter. No FH of thyroid cancer. + h/o radiation tx to head or neck, but L breast RxTx.  No seaweed or kelp, no recent contrast studies. No recent steroid use. No herbal supplements other than than a green drink. No Biotin use.  Pt. also has a history of BrCA in 08/2015.  She also has a history of fatty liver disease.  ROS: Constitutional: + see HPI Eyes: + blurry vision, no xerophthalmia ENT: no sore throat, + see HPI Cardiovascular: no CP/+ SOB/+ palpitations/+ leg swelling Respiratory: no cough/+ SOB Gastrointestinal: no N/V/D/+ C Musculoskeletal: no muscle/+ joint aches Skin: no rashes, + hair loss Neurological: no tremors/numbness/tingling/dizziness Psychiatric: no depression/+ anxiety  Past  Medical History:  Diagnosis Date  . Cancer ([place])    left breast  . Chronic constipation   . Endometriosis   . Family history of breast cancer   . Family history of prostate cancer   . Headache   . History of radiation therapy 01/21/16- 03/03/16   Left breast/ 50 Gy in 25 fractions. Left Breast Boost/ 10 Gy in 5 Fractions.  . Migraines   . Personal history of chemotherapy   . Personal history of radiation therapy    Past Surgical History:  Procedure Laterality Date  . BREAST LUMPECTOMY WITH RADIOACTIVE SEED AND SENTINEL LYMPH NODE BIOPSY Left 08/28/2015   Procedure: LEFT BREAST LUMPECTOMY WITH RADIOACTIVE SEED AND SENTINEL LYMPH NODE BIOPSY;  Surgeon: [name], MD;  Location: [facility];  Service: General;  Laterality: Left;  . CESAREAN SECTION    . EXCISION / BIOPSY BREAST / NIPPLE / DUCT Left 08/28/2015  . LAPAROSCOPIC SALPINGO OOPHERECTOMY Right   . TONSILLECTOMY     Social History   Social History  . Marital status: Divorced    Spouse name: N/A  . Number of children: 2   Occupational History  . RN for [employer]    Social History Main Topics  . Smoking status: Never Smoker  . Smokeless tobacco: Never Used  . Alcohol use Yes     Comment: social - wine  . Drug use: No  . Sexual activity: Yes    Birth control/ protection: Surgical  Current Outpatient Prescriptions on File Prior to Visit  Medication Sig Dispense Refill  . metoCLOPramide (REGLAN) 10 MG tablet Take 1 tablet (10 mg total) by mouth every 8 (eight) hours as needed for nausea. (Patient not taking: Reported on 08/07/2016) 90 tablet 1  . prochlorperazine (COMPAZINE) 5 MG tablet Take 5 mg by mouth every 6 (six) hours as needed for nausea or vomiting.    . promethazine (PHENERGAN) 25 MG suppository Place 1 suppository (25 mg total) rectally every 6 (six) hours as needed for nausea or vomiting. (Patient not taking: Reported on 05/16/2016) 12 each 0   No current facility-administered  medications on file prior to visit.    Allergies  Allergen Reactions  . Tetracyclines & Related Itching  . Other Rash    Powder in gloves causes rash   Family History  Problem Relation Age of Onset  . Prostate cancer Father 36    Gleason = 7  . Prostate cancer Maternal Uncle     dx in his 62s  . Breast cancer Paternal Aunt     dx in her 74s  . Prostate cancer Paternal Uncle   . Alzheimer's disease Maternal Grandmother   . Lung cancer Maternal Grandfather   . Congestive Heart Failure Paternal Grandmother   . Prostate cancer Maternal Uncle     dx in his 92s  . Breast cancer Paternal Aunt     dx in her 10s; maternal half paternal aunt  . Prostate cancer Paternal Uncle     dx in his 69s  . Breast cancer Cousin   . Breast cancer Cousin     father's maternal cousin's daughter; dx in her 65s-30s   PE: BP 118/64 (BP Location: Right Arm, Patient Position: Sitting, Cuff Size: Normal)   Pulse 68   Temp 98.6 F (37 C) (Oral)   Resp 12   Ht 5' 5.5" (1.664 m)   Wt 165 lb 9.6 oz (75.1 kg)   BMI 27.14 kg/m  Wt Readings from Last 3 Encounters:  09/17/16 165 lb 9.6 oz (75.1 kg)  08/07/16 163 lb 4 oz (74 kg)  08/06/16 162 lb (73.5 kg)   Constitutional: overweight, in NAD Eyes: PERRLA, EOMI, no exophthalmos, no lid lag, no stare ENT: moist mucous membranes, + slight symmetric thyromegaly, no thyroid bruits, no cervical lymphadenopathy Cardiovascular: RRR, No MRG Respiratory: CTA B Gastrointestinal: abdomen soft, NT, ND, BS+ Musculoskeletal: no deformities, strength intact in all 4 Skin: moist, warm, no rashes Neurological: no tremor with outstretched hands, DTR normal in all 4  ASSESSMENT: 1. Thyrotoxicosis  PLAN:  1. Patient with a recently found Thyrotoxicosis, with thyrotoxic sxs: weight loss, heat intolerance, palpitations, anxiety.  - she does not appear to have exogenous causes for the low TSH.  - We discussed that possible causes of thyrotoxicosis are:  Graves ds    Thyroiditis toxic multinodular goiter/ toxic adenoma (I cannot feel nodules at palpation of her thyroid). - I suggested that we check the TSH, fT3 and fT4 and also add thyroid stimulating antibodies to screen for Graves' disease.  - If the tests remain abnormal, we may need an uptake and scan to differentiate between the 3 above possible etiologies  - we discussed about possible modalities of treatment for the above conditions, to include methimazole use, radioactive iodine ablation or (last resort) surgery. - We may need to do thyroid ultrasound depending on the results of the uptake and scan (if a cold nodule is present) - I do not feel that  we need to add beta blockers at this time, since she is not tachycardic, anxious, or tremulous - I advised her to join my chart to communicate easier - RTC in 4 months, but likely sooner for repeat labs  Component     Latest Ref Rng & Units 09/17/2016  TSH     0.35 - 4.50 uIU/mL 0.26 (L)  Triiodothyronine,Free,Serum     2.3 - 4.2 pg/mL 3.5  T4,Free(Direct)     0.60 - 1.60 ng/dL 0.72   Labs are much improved in 1 month, consistent with a diagnosis of resolving subacute thyroiditis. No intervention is needed for now, however, I would like to repeat her labs in 5 weeks.   TSI antibodies are slightly high, which can be c/w thyroiditis picture. Component     Latest Ref Rng & Units 09/17/2016  TSI     <140 % baseline 195 (H)   CC: Dr. Orlena Sheldon, MD PhD [facility]

## 2016-09-21 LAB — THYROID STIMULATING IMMUNOGLOBULIN: TSI: 195 %{baseline} — AB (ref <140)

## 2016-09-22 ENCOUNTER — Telehealth: Payer: Self-pay | Admitting: Internal Medicine

## 2016-09-22 NOTE — Telephone Encounter (Signed)
Patient is calling for the results of her labs °

## 2016-09-22 NOTE — Telephone Encounter (Signed)
Please advise of lab work. I see it has been completed but not reviewed by you yet. Once it is, I will contact patient back. Thank you!

## 2016-09-22 NOTE — Telephone Encounter (Signed)
Sorry for the delay. Her labs are much better (TSH is now only mildly low, with the free hormones in the normal range), consistent with improving thyroiditis. We will need labs again in 1.5 months to make sure that they continue to improve, however, no intervention is needed for now.

## 2016-09-23 ENCOUNTER — Telehealth: Payer: Self-pay

## 2016-09-23 NOTE — Telephone Encounter (Signed)
Called and notified patient of lab results. Patient scheduled lab appointment, and had no questions at this time.

## 2016-09-29 ENCOUNTER — Ambulatory Visit (HOSPITAL_BASED_OUTPATIENT_CLINIC_OR_DEPARTMENT_OTHER): Payer: 59 | Admitting: Oncology

## 2016-09-29 ENCOUNTER — Other Ambulatory Visit (HOSPITAL_BASED_OUTPATIENT_CLINIC_OR_DEPARTMENT_OTHER): Payer: 59

## 2016-09-29 VITALS — BP 120/79 | HR 67 | Temp 97.7°F | Resp 18 | Ht 65.5 in | Wt 165.8 lb

## 2016-09-29 DIAGNOSIS — M8580 Other specified disorders of bone density and structure, unspecified site: Secondary | ICD-10-CM

## 2016-09-29 DIAGNOSIS — C50412 Malignant neoplasm of upper-outer quadrant of left female breast: Secondary | ICD-10-CM | POA: Diagnosis not present

## 2016-09-29 DIAGNOSIS — Z170 Estrogen receptor positive status [ER+]: Secondary | ICD-10-CM

## 2016-09-29 DIAGNOSIS — Z79810 Long term (current) use of selective estrogen receptor modulators (SERMs): Secondary | ICD-10-CM | POA: Diagnosis not present

## 2016-09-29 LAB — CBC WITH DIFFERENTIAL/PLATELET
BASO%: 0.5 % (ref 0.0–2.0)
BASOS ABS: 0 10*3/uL (ref 0.0–0.1)
EOS%: 1.1 % (ref 0.0–7.0)
Eosinophils Absolute: 0.1 10*3/uL (ref 0.0–0.5)
HEMATOCRIT: 43.1 % (ref 34.8–46.6)
HGB: 13.8 g/dL (ref 11.6–15.9)
LYMPH#: 2.4 10*3/uL (ref 0.9–3.3)
LYMPH%: 46.7 % (ref 14.0–49.7)
MCH: 26.7 pg (ref 25.1–34.0)
MCHC: 32.1 g/dL (ref 31.5–36.0)
MCV: 83.4 fL (ref 79.5–101.0)
MONO#: 0.2 10*3/uL (ref 0.1–0.9)
MONO%: 4.1 % (ref 0.0–14.0)
NEUT#: 2.4 10*3/uL (ref 1.5–6.5)
NEUT%: 47.6 % (ref 38.4–76.8)
PLATELETS: 224 10*3/uL (ref 145–400)
RBC: 5.17 10*6/uL (ref 3.70–5.45)
RDW: 14.1 % (ref 11.2–14.5)
WBC: 5.1 10*3/uL (ref 3.9–10.3)

## 2016-09-29 LAB — COMPREHENSIVE METABOLIC PANEL
ALT: 42 U/L (ref 0–55)
ANION GAP: 10 meq/L (ref 3–11)
AST: 38 U/L — ABNORMAL HIGH (ref 5–34)
Albumin: 4 g/dL (ref 3.5–5.0)
Alkaline Phosphatase: 104 U/L (ref 40–150)
BUN: 13.4 mg/dL (ref 7.0–26.0)
CALCIUM: 9.6 mg/dL (ref 8.4–10.4)
CHLORIDE: 107 meq/L (ref 98–109)
CO2: 26 meq/L (ref 22–29)
CREATININE: 1 mg/dL (ref 0.6–1.1)
EGFR: 76 mL/min/{1.73_m2} — AB (ref >90)
Glucose: 103 mg/dl (ref 70–140)
POTASSIUM: 4.3 meq/L (ref 3.5–5.1)
Sodium: 143 mEq/L (ref 136–145)
Total Bilirubin: 0.54 mg/dL (ref 0.20–1.20)
Total Protein: 7.3 g/dL (ref 6.4–8.3)

## 2016-09-29 NOTE — Progress Notes (Signed)
Preparing myself am sorry to her more sore and your  [place]  [phone] [phone]) [phone]   ID: Ashley Tyler DOB: 07/26/1965  MR#: [id number]  OIZ#:[id number]  Patient Care Team: Lavone Orn, MD as PCP - General (Internal Medicine) Chauncey Cruel, MD as Consulting Physician (Oncology) [name], MD as Consulting Physician (General Surgery) [name], MD as Attending Physician (Radiation Oncology) PCP: [name], MD GYN: OTHER MD:  CHIEF COMPLAINT: Estrogen receptor positive breast cancer  CURRENT TREATMENT: Tamoxifen  BREAST CANCER HISTORY: From the original intake note:  [name] had screening mammography at Dr. Caralee Ates office 07/24/2015 suggesting a left breast mass and the patient was referred to the [facility] where on 07/30/2015 she underwent left diagnostic mammography with tomosynthesis and left breast ultrasonography. The breast density was category C. In the upper outer quadrant of the left breast there was a 2.3 cm spiculated mass. There were no associated calcifications. This mass was palpable at the 1:00 position 5 cm from the nipple. Ultrasound confirmed an irregular hypoechoic mass measuring 1.8 cm. Ultrasonography of the left axilla was negative.  On 08/03/2015 the patient underwent biopsy of the left breast mass in question. 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  The patient then met with surgery and after appropriate discussion she proceeded to left lumpectomy and sentinel lymph node sampling 08/28/2015. The final pathology ([id number]) showed invasive ductal carcinoma, grade 3, measuring 2.1 cm. Margins were close but negative. All 3 sentinel lymph nodes were  clear. Repeat HER-2 was again negative, with a signals ratio of 0.9, and number per cell 2.20.  INTERVAL HISTORY: [name] returns today for follow-up of her breast cancer. She was experiencing palpitations in February. We stopped the tamoxifen although we did not think that was related. She was evaluated by cardiology and found to have a very low TSH. She was then referred to Dr. Renne Crigler who worked up for thyrotoxicosis. There was no evidence of Graves'. The working assumption is that radiation somehow affect that the thyroid. Repeat TSH 10 days ago was still low but at least measurable. Her palpitations in the meantime have completely resolved.  She is back on tamoxifen. She tells me her hot flashes never stopped even when she was off the medication for 2 months. Her hair also is still not growing. She obtains a drug at a good cost.  REVIEW OF SYSTEMS: A detailed review of systems today was otherwise noncontributory  PAST MEDICAL HISTORY: Past Medical History:  Diagnosis Date  . Cancer ([place])    left breast  . Chronic constipation   . Endometriosis   . Family history of breast cancer   . Family history of prostate cancer   . Headache   . History of radiation therapy 01/21/16- 03/03/16   Left breast/ 50 Gy in 25 fractions. Left Breast Boost/ 10 Gy in 5 Fractions.  . Migraines   . Personal history of chemotherapy   . Personal history of radiation therapy     PAST SURGICAL HISTORY: Past Surgical History:  Procedure Laterality Date  . BREAST LUMPECTOMY WITH RADIOACTIVE SEED AND SENTINEL LYMPH NODE BIOPSY Left 08/28/2015   Procedure: LEFT BREAST LUMPECTOMY WITH RADIOACTIVE SEED AND SENTINEL LYMPH NODE BIOPSY;  Surgeon: [name], MD;  Location: [facility];  Service: General;  Laterality: Left;  . CESAREAN SECTION    . EXCISION / BIOPSY BREAST / NIPPLE / DUCT Left 08/28/2015  . LAPAROSCOPIC SALPINGO OOPHERECTOMY Right   . TONSILLECTOMY      FAMILY HISTORY Family  History  Problem Relation Age of Onset  . Prostate cancer Father 64    Gleason = 7  . Prostate cancer Maternal Uncle     dx in his 42s  . Breast cancer Paternal Aunt     dx in her 44s  . Prostate cancer Paternal Uncle   . Alzheimer's disease Maternal Grandmother   . Lung cancer Maternal Grandfather   . Congestive Heart Failure Paternal Grandmother   . Prostate cancer Maternal Uncle     dx in his 31s  . Breast cancer Paternal Aunt     dx in her 68s; maternal half paternal aunt  . Prostate cancer Paternal Uncle     dx in his 74s  . Breast cancer Cousin   . Breast cancer Cousin     father's maternal cousin's daughter; dx in her 43s-30s  The patient's parents are still living, in their early 81s as of April 2016. The patient is an only child. On her father's side there are 4 cases of breast cancer, 2 aunts diagnosed in their 66s and 24s, and 2 cousins diagnosed in her 70s and one at age 18. The patient's father had prostate cancer diagnosed age 45.  GYNECOLOGIC HISTORY:  No LMP recorded. Patient is not currently having periods (Reason: Chemotherapy). Menarche age 25, first live birth age 7. The patient is GX P2, although both children were born early. She is status post right salpingo-oophorectomy due to endometriosis. 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 She is divorced. At home is just she and her daughter Ashley Tyler, 74. Her other daughter, Ashley Tyler, is studying criminal justice at [employer]. Her SO is this is with. [name]    ADVANCED DIRECTIVES: Not in place   HEALTH MAINTENANCE: Social History  Substance Use Topics  . Smoking status: Never Smoker  . Smokeless tobacco: Never Used  . Alcohol use Yes     Comment: social     Colonoscopy:  PAP:  Bone density:  Lipid panel:  Allergies  Allergen Reactions  . Tetracyclines & Related Itching  . Other  Rash    Powder in gloves causes rash    Current Outpatient Prescriptions  Medication Sig Dispense Refill  . metoCLOPramide (REGLAN) 10 MG tablet Take 1 tablet (10 mg total) by mouth every 8 (eight) hours as needed for nausea. (Patient not taking: Reported on 08/07/2016) 90 tablet 1  . prochlorperazine (COMPAZINE) 5 MG tablet Take 5 mg by mouth every 6 (six) hours as needed for nausea or vomiting.    . promethazine (PHENERGAN) 25 MG suppository Place 1 suppository (25 mg total) rectally every 6 (six) hours as needed for nausea or vomiting. (Patient not taking: Reported on 05/16/2016) 12 each 0   No current facility-administered medications for this visit.     OBJECTIVE: Middle-aged African-American woman in no acute distress Vitals:   09/29/16 1030  BP: 120/79  Pulse: 67  Resp: 18  Temp: 97.7 F (36.5 C)     Body mass index is 27.17 kg/m.    ECOG FS:0 - Asymptomatic Sclerae unicteric, pupils round and equal Oropharynx clear and moist No cervical or  supraclavicular adenopathy Lungs no rales or rhonchi Heart regular rate and rhythm Abd soft, nontender, positive bowel sounds MSK no focal spinal tenderness, no upper extremity lymphedema Neuro: nonfocal, well oriented, appropriate affect Breasts: The right breast is benign. The left breast is status post lumpectomy and radiation. There is no evidence of local recurrence. The hyperpigmentation is resolving. Both axillae are benign.     LAB RESULTS:  CMP     Component Value Date/Time   NA 140 08/07/2016 1254   NA 142 06/16/2016 0754   K 3.7 08/07/2016 1254   K 4.4 06/16/2016 0754   CL 109 08/07/2016 1254   CO2 25 08/07/2016 1254   CO2 25 06/16/2016 0754   GLUCOSE 99 08/07/2016 1254   GLUCOSE 108 06/16/2016 0754   BUN 14 08/07/2016 1254   BUN 17.3 06/16/2016 0754   CREATININE 0.67 08/07/2016 1254   CREATININE 0.9 06/16/2016 0754   CALCIUM 9.4 08/07/2016 1254   CALCIUM 9.2 06/16/2016 0754   PROT 6.6 06/16/2016 0754    ALBUMIN 3.6 06/16/2016 0754   AST 27 06/16/2016 0754   ALT 26 06/16/2016 0754   ALKPHOS 74 06/16/2016 0754   BILITOT 0.52 06/16/2016 0754   GFRNONAA >60 08/07/2016 1254   GFRAA >60 08/07/2016 1254    INo results found for: SPEP, UPEP  Lab Results  Component Value Date   WBC 5.1 09/29/2016   NEUTROABS 2.4 09/29/2016   HGB 13.8 09/29/2016   HCT 43.1 09/29/2016   MCV 83.4 09/29/2016   PLT 224 09/29/2016      Chemistry      Component Value Date/Time   NA 140 08/07/2016 1254   NA 142 06/16/2016 0754   K 3.7 08/07/2016 1254   K 4.4 06/16/2016 0754   CL 109 08/07/2016 1254   CO2 25 08/07/2016 1254   CO2 25 06/16/2016 0754   BUN 14 08/07/2016 1254   BUN 17.3 06/16/2016 0754   CREATININE 0.67 08/07/2016 1254   CREATININE 0.9 06/16/2016 0754      Component Value Date/Time   CALCIUM 9.4 08/07/2016 1254   CALCIUM 9.2 06/16/2016 0754   ALKPHOS 74 06/16/2016 0754   AST 27 06/16/2016 0754   ALT 26 06/16/2016 0754   BILITOT 0.52 06/16/2016 0754       No results found for: LABCA2  No components found for: LABCA125  No results for input(s): INR in the last 168 hours.  Urinalysis    Component Value Date/Time   COLORURINE YELLOW 04/07/2016 2007   APPEARANCEUR CLOUDY (A) 04/07/2016 2007   LABSPEC 1.028 04/07/2016 2007   LABSPEC 1.015 10/17/2015 1102   PHURINE 8.5 (H) 04/07/2016 2007   GLUCOSEU NEGATIVE 04/07/2016 2007   GLUCOSEU Negative 10/17/2015 1102   HGBUR NEGATIVE 04/07/2016 2007   BILIRUBINUR NEGATIVE 04/07/2016 2007   BILIRUBINUR Negative 10/17/2015 1102   KETONESUR 40 (A) 04/07/2016 2007   PROTEINUR 30 (A) 04/07/2016 2007   UROBILINOGEN 0.2 10/17/2015 1102   NITRITE NEGATIVE 04/07/2016 2007   LEUKOCYTESUR TRACE (A) 04/07/2016 2007   LEUKOCYTESUR Negative 10/17/2015 1102      ELIGIBLE FOR AVAILABLE RESEARCH PROTOCOL: PALLAS  STUDIES: Mammography at the [facility] 07/24/2016 found the breast density to be category C. There was no evidence of  malignancy.  ASSESSMENT: 51 y.o. [place] woman status post left breast upper outer quadrant biopsy 08/03/2015 for a clinical T2 N0, stage IIa invasive ductal carcinoma, grade 3, estrogen and progesterone receptor positive, HER-2 not amplified, with an MIB-1 of 70%  (1)  status post left lumpectomy and sentinel lymph node sampling 08/28/2015 for a pT2 pN0, stage IIA invasive ductal carcinoma, grade 3, with close but negative margins. Repeat HER-2 was again negative  (a) oncotype DX Score of 22 predicts a risk of recurrence outside the breast within 10 years of 14% if the patient's only systemic therapy is tamoxifen for 5 years. 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 This is favorable.  She is tolerating the tamoxifen well. It is interesting that she found no difference in hot flashes when she went off it for 2 months.  She still doesn't have much hair growth. It could be that the thyroid problem is contributing. Hopefully she will see some improvement in that regard over the next several months.  She  will see Dr. Renne Crigler again in August. I'm going to see her again in November. If all continues well at that time I will see her 6 months after that and then yearly.    Chauncey Cruel, MD   09/29/2016 10:49 AM

## 2016-11-06 ENCOUNTER — Telehealth: Payer: Self-pay

## 2016-11-06 ENCOUNTER — Other Ambulatory Visit (INDEPENDENT_AMBULATORY_CARE_PROVIDER_SITE_OTHER): Payer: 59

## 2016-11-06 DIAGNOSIS — E0590 Thyrotoxicosis, unspecified without thyrotoxic crisis or storm: Secondary | ICD-10-CM | POA: Diagnosis not present

## 2016-11-06 LAB — TSH: TSH: 3.78 u[IU]/mL (ref 0.35–4.50)

## 2016-11-06 LAB — T3, FREE: T3, Free: 3.1 pg/mL (ref 2.3–4.2)

## 2016-11-06 LAB — T4, FREE: Free T4: 0.78 ng/dL (ref 0.60–1.60)

## 2016-11-06 NOTE — Telephone Encounter (Signed)
-----   Message from Philemon Kingdom, MD sent at 11/06/2016 12:32 PM EDT ----- Ashley Tyler, can you please call pt: Great news: All her thyroid tests are now normal. I will repeat them when she comes back in August. No intervention needed for now.

## 2016-11-06 NOTE — Telephone Encounter (Signed)
LVM, gave lab results. Gave call back number if any questions or concerns.  

## 2017-01-16 ENCOUNTER — Ambulatory Visit: Payer: 59 | Admitting: Internal Medicine

## 2017-01-19 ENCOUNTER — Telehealth: Payer: Self-pay

## 2017-01-19 ENCOUNTER — Encounter: Payer: Self-pay | Admitting: Internal Medicine

## 2017-01-19 ENCOUNTER — Ambulatory Visit (INDEPENDENT_AMBULATORY_CARE_PROVIDER_SITE_OTHER): Payer: 59 | Admitting: Internal Medicine

## 2017-01-19 VITALS — BP 122/82 | HR 66 | Wt 171.0 lb

## 2017-01-19 DIAGNOSIS — E0580 Other thyrotoxicosis without thyrotoxic crisis or storm: Secondary | ICD-10-CM

## 2017-01-19 DIAGNOSIS — E010 Iodine-deficiency related diffuse (endemic) goiter: Secondary | ICD-10-CM

## 2017-01-19 DIAGNOSIS — E063 Autoimmune thyroiditis: Secondary | ICD-10-CM

## 2017-01-19 LAB — T4, FREE: FREE T4: 0.82 ng/dL (ref 0.60–1.60)

## 2017-01-19 LAB — TSH: TSH: 2.26 u[IU]/mL (ref 0.35–4.50)

## 2017-01-19 LAB — T3, FREE: T3, Free: 2.9 pg/mL (ref 2.3–4.2)

## 2017-01-19 NOTE — Patient Instructions (Signed)
Please stop at the lab.  We will schedule a new appt depending on the results today. If they are normal, please have a TSH followed by your PCP every year at least.

## 2017-01-19 NOTE — Telephone Encounter (Signed)
Called patient and gave lab results. Patient had no questions or concerns.  

## 2017-01-19 NOTE — Telephone Encounter (Signed)
-----   Message from Philemon Kingdom, MD sent at 01/19/2017 12:40 PM EDT ----- Almyra Free, can you please call pt: TFTs are normal

## 2017-01-19 NOTE — Progress Notes (Addendum)
Patient ID: Ashley Tyler, female   DOB: February 27, 1966, 51 y.o.   MRN: [id number]    HPI  Ashley Tyler is a 51 y.o.-year-old female, initially referred by her cardiologist, Dr. Aundra Dubin, returning for f/u for resolving subacute thyroiditis. Last visit 4 mo ago.  Reviewed and addended hx: She has a h/o fatigue, thinning hair, anxiety, hypopigmentation of skin in 2016 >> saw a holistic Dr. At the time >> TFTs abnormal >> Turmeric, Selenium, Iodine, vitamin D.She started these >> no change in sxs. 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 Started Tamoxifen in 05/2017.  She started to have palpitations in 07/2016 >> saw Dr. Aundra Dubin (>> 2D Echo normal, but PVCs) >> he checked her TFTs >>  thyrotoxicosis.   At last visit, we checked her TFTs and they were much improved (subclinical thyrotoxicosis). We did not intervene at that point and subsequent TFTs were all normal.  I reviewed pt's thyroid tests: Lab Results  Component Value Date   TSH 3.78 11/06/2016   TSH 0.26 (L) 09/17/2016   TSH <0.010 (L) 08/08/2016   TSH <0.010 (L) 08/07/2016   FREET4 0.78 11/06/2016   FREET4 0.72 09/17/2016   FREET4 2.50 (H) 08/08/2016   T3FREE 3.1 11/06/2016   T3FREE 3.5 09/17/2016   T3FREE 7.3 (H) 08/08/2016   Pt denies: - feeling nodules in neck - hoarseness - dysphagia - choking - SOB with lying down  Pt does have a FH of thyroid ds.: MGM - goiter. No FH of thyroid cancer.   + h/o radiation tx to head or neck, but L breast RxTx. No seaweed or kelp. No recent contrast studies. No herbal supplements. She did not restart Biotin. No recent steroids use.   Pt. also has a history of BrCA in 08/2015.  She also has a history of fatty liver disease.  ROS: Constitutional: + weight gain/but prev weight loss - 20 lbs, + fatigue, + hot flushes; + insomnia Eyes: no blurry vision, no xerophthalmia ENT: no sore throat, no nodules palpated in throat, no dysphagia, no odynophagia,  no hoarseness Cardiovascular: no CP/no SOB/no palpitations/no leg swelling Respiratory: no cough/no SOB/no wheezing Gastrointestinal: no N/no V/no D/no C/no acid reflux Musculoskeletal: no muscle aches/+ joint aches (R hip) Skin: no rashes, no hair loss Neurological: no tremors/no numbness/no tingling/no dizziness  I reviewed pt's medications, allergies, PMH, social hx, family hx, and changes were documented in the history of present illness. Otherwise, unchanged from my initial visit note.  Past Medical History:  Diagnosis Date  . Cancer ([place])    left breast  . Chronic constipation   . Endometriosis   . Family history of breast cancer   . Family history of prostate cancer   . Headache   . History of radiation therapy 01/21/16- 03/03/16   Left breast/ 50 Gy in 25 fractions. Left Breast Boost/ 10 Gy in 5 Fractions.  . Migraines   . Personal history of chemotherapy   . Personal history of radiation therapy    Past Surgical History:  Procedure Laterality Date  . BREAST LUMPECTOMY WITH RADIOACTIVE SEED AND SENTINEL LYMPH NODE BIOPSY Left 08/28/2015   Procedure: LEFT BREAST LUMPECTOMY WITH RADIOACTIVE SEED AND SENTINEL LYMPH NODE BIOPSY;  Surgeon: [name], MD;  Location: [facility];  Service: General;  Laterality: Left;  . CESAREAN SECTION    . EXCISION / BIOPSY BREAST / NIPPLE / DUCT Left 08/28/2015  . LAPAROSCOPIC SALPINGO OOPHERECTOMY Right   . TONSILLECTOMY  Social History   Social History  . Marital status: Divorced    Spouse name: N/A  . Number of children: 2   Occupational History  . RN for [employer]    Social History Main Topics  . Smoking status: Never Smoker  . Smokeless tobacco: Never Used  . Alcohol use Yes     Comment: social - wine  . Drug use: No  . Sexual activity: Yes    Birth control/ protection: Surgical   No current outpatient prescriptions on file prior to visit.   No current facility-administered medications on  file prior to visit.    Allergies  Allergen Reactions  . Tetracyclines & Related Itching  . Other Rash    Powder in gloves causes rash   Family History  Problem Relation Age of Onset  . Prostate cancer Father 24       Gleason = 7  . Prostate cancer Maternal Uncle        dx in his 84s  . Breast cancer Paternal Aunt        dx in her 6s  . Prostate cancer Paternal Uncle   . Alzheimer's disease Maternal Grandmother   . Lung cancer Maternal Grandfather   . Congestive Heart Failure Paternal Grandmother   . Prostate cancer Maternal Uncle        dx in his 64s  . Breast cancer Paternal Aunt        dx in her 74s; maternal half paternal aunt  . Prostate cancer Paternal Uncle        dx in his 53s  . Breast cancer Cousin   . Breast cancer Cousin        father's maternal cousin's daughter; dx in her 40s-30s   PE: BP 122/82 (BP Location: Left Arm, Patient Position: Sitting)   Pulse 66   Wt 171 lb (77.6 kg)   SpO2 98%   BMI 28.02 kg/m  Wt Readings from Last 3 Encounters:  01/19/17 171 lb (77.6 kg)  09/29/16 165 lb 12.8 oz (75.2 kg)  09/17/16 165 lb 9.6 oz (75.1 kg)   Constitutional: overweight, in NAD Eyes: PERRLA, EOMI, no exophthalmos ENT: moist mucous membranes, + slight symmetric thyromegaly, no cervical lymphadenopathy Cardiovascular: RRR, No MRG Respiratory: CTA B Gastrointestinal: abdomen soft, NT, ND, BS+ Musculoskeletal: no deformities, strength intact in all 4 Skin: moist, warm, no rashes Neurological: no tremor with outstretched hands, DTR normal in all 4  ASSESSMENT: 1. Thyrotoxicosis  - most likely resolving thyroiditis  2. Thyromegaly  PLAN:  1. Patient with a History of thyrotoxicosis and consistent symptoms (weight loss, heat intolerance, palpitations, anxiety), which  improved at last visit and resolved per the last thyroid tests checked 2 months ago. She is also symptom-free now. We discussed that this is usually a condition that resolves completely and  does not require treatment, however, it may recover in the future. We will recheck her tests today to make sure they remain normal, and I advised them that if they do, she only requires follow-up with annual TSH by PCP. However, if she starts having symptoms of hyperthyroidism as she had with the previous episode she needs to contact me immediately so we can check her TFTs and see if we need to intervene. - She agrees with the plan. - she will return to see me as needed  2. Thyromegaly  - She has a symmetric firm thyromegaly without any nodules palpated.  - We discussed about checking the thyroid ultrasound and  she agrees  Orders Placed This Encounter  Procedures  . US SOFT TISSUE HEAD AND NECK    Standing Status:   Future    Number of Occurrences:   1    Standing Expiration Date:   01/19/2018    Order Specific Question:   Reason for Exam (SYMPTOM  OR DIAGNOSIS REQUIRED)    Answer:   palpable symmetric thyroid; h/o thyroiditis    Order Specific Question:   Preferred imaging location?    Answer:   [facility]  . TSH  . T4, free  . T3, free   CC: Dr. [name]  Component     Latest Ref Rng & Units 01/19/2017  TSH     0.35 - 4.50 uIU/mL 2.26  Triiodothyronine,Free,Serum     2.3 - 4.2 pg/mL 2.9  T4,Free(Direct)     0.60 - 1.60 ng/dL 0.82  TFTs normal.  US SOFT TISSUE HEAD AND NECK  Order: [id number]  Status:  Final result Visible to patient:  No (Not Released) Dx:  Other thyrotoxicosis without thyrotox...  Details   Reading Physician Reading Date Result Priority  Arne Cleveland, MD 01/24/2017   Narrative    CLINICAL DATA: Palpable enlarged symmetric thyroid, history of thyroiditis  EXAM: THYROID ULTRASOUND  TECHNIQUE: Ultrasound examination of the thyroid gland and adjacent soft tissues was performed.  COMPARISON: None.  FINDINGS: Parenchymal Echotexture: Mildly heterogenous  Isthmus: 0.4 cm thickness  Right lobe: 5.3 x 1.3 x 1.3 cm  Left lobe: 5.2  x 1.3 x 1.5 cm  _________________________________________________________  Estimated total number of nodules >/= 1 cm: 1  Number of spongiform nodules >/= 2 cm not described below (TR1): 0  Number of mixed cystic and solid nodules >/= 1.5 cm not described below (TR2):  _________________________________________________________  Nodule # 1:  Location: Left; Mid  Maximum size: 1.3 cm; Other 2 dimensions: 0.9 x 1.0 cm  Composition: solid/almost completely solid (2)  Echogenicity: isoechoic (1)  Shape: not taller-than-wide (0)  Margins: smooth (0)  Echogenic foci: none (0)  ACR TI-RADS total points: 3.  ACR TI-RADS risk category: TR3 (3 points).  ACR TI-RADS recommendations:  Given size (<1.4 cm) and appearance, this nodule does NOT meet TI-RADS criteria for biopsy or dedicated follow-up.  _________________________________________________________  Several subcentimeter hypoechoic nodules without calcifications in the right lobe.  IMPRESSION: 1. Mild thyromegaly with small bilateral nodules. None meets criteria for biopsy or dedicated imaging follow-up.  The above is in keeping with the ACR TI-RADS recommendations - J Am Coll Radiol 2017;14:587-595.   Electronically Signed By: Lucrezia Europe M.D. On: 01/24/2017 10:13       Mild thyromegaly with 1 isoechoic nodule in the left lobe, that is not worrisome. No imaging f/u necessary for this nodule.  Philemon Kingdom, MD PhD [facility] Endocrinology

## 2017-01-23 ENCOUNTER — Ambulatory Visit
Admission: RE | Admit: 2017-01-23 | Discharge: 2017-01-23 | Disposition: A | Discharge status: Home / Self Care | Payer: 59 | Source: Ambulatory Visit | Attending: Internal Medicine | Admitting: Internal Medicine

## 2017-01-26 ENCOUNTER — Telehealth: Payer: Self-pay

## 2017-01-26 NOTE — Telephone Encounter (Signed)
-----   Message from Philemon Kingdom, MD sent at 01/26/2017  7:07 AM EDT ----- Almyra Free, can you please call pt: Thyroid is slightly enlarged, with 1 small (1.3 cm) nodule in the left lobe, which is not worrisome. These nodules are very common.  No imaging follow up is necessary.

## 2017-01-26 NOTE — Telephone Encounter (Signed)
Called patient and gave results. Patient had no questions or concerns.

## 2017-04-06 ENCOUNTER — Telehealth: Payer: Self-pay | Admitting: *Deleted

## 2017-04-06 NOTE — Telephone Encounter (Signed)
"  I forgot when my appointment is for this month?"  November 13-2018 2:30 lab, F/U.  Asked for 30 minute early arrival for registration.

## 2017-04-13 ENCOUNTER — Other Ambulatory Visit: Payer: Self-pay

## 2017-04-13 DIAGNOSIS — Z170 Estrogen receptor positive status [ER+]: Principal | ICD-10-CM

## 2017-04-13 DIAGNOSIS — C50412 Malignant neoplasm of upper-outer quadrant of left female breast: Secondary | ICD-10-CM

## 2017-04-14 ENCOUNTER — Other Ambulatory Visit (HOSPITAL_BASED_OUTPATIENT_CLINIC_OR_DEPARTMENT_OTHER): Payer: 59

## 2017-04-14 ENCOUNTER — Telehealth: Payer: Self-pay | Admitting: Adult Health

## 2017-04-14 ENCOUNTER — Encounter: Payer: Self-pay | Admitting: Adult Health

## 2017-04-14 ENCOUNTER — Ambulatory Visit (HOSPITAL_BASED_OUTPATIENT_CLINIC_OR_DEPARTMENT_OTHER): Payer: 59 | Admitting: Adult Health

## 2017-04-14 VITALS — BP 127/72 | HR 66 | Temp 98.0°F | Resp 20 | Ht 65.5 in | Wt 177.5 lb

## 2017-04-14 DIAGNOSIS — Z170 Estrogen receptor positive status [ER+]: Secondary | ICD-10-CM | POA: Diagnosis not present

## 2017-04-14 DIAGNOSIS — E041 Nontoxic single thyroid nodule: Secondary | ICD-10-CM

## 2017-04-14 DIAGNOSIS — C50412 Malignant neoplasm of upper-outer quadrant of left female breast: Secondary | ICD-10-CM | POA: Diagnosis not present

## 2017-04-14 DIAGNOSIS — E0590 Thyrotoxicosis, unspecified without thyrotoxic crisis or storm: Secondary | ICD-10-CM

## 2017-04-14 DIAGNOSIS — M8580 Other specified disorders of bone density and structure, unspecified site: Secondary | ICD-10-CM | POA: Diagnosis not present

## 2017-04-14 LAB — CBC WITH DIFFERENTIAL/PLATELET
BASO%: 0.3 % (ref 0.0–2.0)
Basophils Absolute: 0 10*3/uL (ref 0.0–0.1)
EOS ABS: 0.1 10*3/uL (ref 0.0–0.5)
EOS%: 1 % (ref 0.0–7.0)
HEMATOCRIT: 37.6 % (ref 34.8–46.6)
HGB: 11.9 g/dL (ref 11.6–15.9)
LYMPH#: 2.8 10*3/uL (ref 0.9–3.3)
LYMPH%: 46.6 % (ref 14.0–49.7)
MCH: 27.9 pg (ref 25.1–34.0)
MCHC: 31.6 g/dL (ref 31.5–36.0)
MCV: 88.1 fL (ref 79.5–101.0)
MONO#: 0.3 10*3/uL (ref 0.1–0.9)
MONO%: 5.4 % (ref 0.0–14.0)
NEUT%: 46.7 % (ref 38.4–76.8)
NEUTROS ABS: 2.8 10*3/uL (ref 1.5–6.5)
PLATELETS: 210 10*3/uL (ref 145–400)
RBC: 4.27 10*6/uL (ref 3.70–5.45)
RDW: 14 % (ref 11.2–14.5)
WBC: 6 10*3/uL (ref 3.9–10.3)

## 2017-04-14 LAB — COMPREHENSIVE METABOLIC PANEL
ALBUMIN: 3.5 g/dL (ref 3.5–5.0)
ALK PHOS: 68 U/L (ref 40–150)
ALT: 17 U/L (ref 0–55)
ANION GAP: 6 meq/L (ref 3–11)
AST: 17 U/L (ref 5–34)
BILIRUBIN TOTAL: 0.22 mg/dL (ref 0.20–1.20)
BUN: 12.7 mg/dL (ref 7.0–26.0)
CALCIUM: 9 mg/dL (ref 8.4–10.4)
CO2: 29 mEq/L (ref 22–29)
CREATININE: 0.9 mg/dL (ref 0.6–1.1)
Chloride: 108 mEq/L (ref 98–109)
EGFR: >60 mL/min/{1.73_m2} (ref >60)
Glucose: 100 mg/dl (ref 70–140)
Potassium: 3.6 mEq/L (ref 3.5–5.1)
Sodium: 143 mEq/L (ref 136–145)
TOTAL PROTEIN: 6.6 g/dL (ref 6.4–8.3)

## 2017-04-14 NOTE — Progress Notes (Signed)
[place]  [phone] Fax:([phone]) [phone]   ID: [name] DOB: 07/27/1965  MR#: [id number]  MMN#:[id number]  Patient Care Team: Lavone Orn, MD as PCP - General (Internal Medicine) Magrinat, Virgie Dad, MD as Consulting Physician (Oncology) [name], MD as Consulting Physician (General Surgery) [name], MD as Attending Physician (Radiation Oncology) Philemon Kingdom, MD as Consulting Physician (Internal Medicine) Larey Dresser, MD as Consulting Physician (Cardiology) PCP: Lavone Orn, MD GYN: OTHER MD:  CHIEF COMPLAINT: Estrogen receptor positive breast cancer  CURRENT TREATMENT: Tamoxifen  BREAST CANCER HISTORY: From the original intake note:  Azalia had screening mammography at Dr. Caralee Ates office 07/24/2015 suggesting a left breast mass and the patient was referred to the [facility] where on 07/30/2015 she underwent left diagnostic mammography with tomosynthesis and left breast ultrasonography. The breast density was category C. In the upper outer quadrant of the left breast there was a 2.3 cm spiculated mass. There were no associated calcifications. This mass was palpable at the 1:00 position 5 cm from the nipple. Ultrasound confirmed an irregular hypoechoic mass measuring 1.8 cm. Ultrasonography of the left axilla was negative.  On 08/03/2015 the patient underwent biopsy of the left breast mass in question. 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  The patient then met with surgery and after appropriate discussion she proceeded to left lumpectomy and sentinel lymph node sampling 08/28/2015. The final pathology ([id number]) showed invasive ductal carcinoma, grade 3, measuring 2.1  cm. Margins were close but negative. All 3 sentinel lymph nodes were clear. Repeat HER-2 was again negative, with a signals ratio of 0.9, and number per cell 2.20.  INTERVAL HISTORY: Kynesha returns today for follow-up of her breast cancer.  She is taking tamoxifen daily and is tolerating it well.  She did undergo ultrasound of her thyroid and nodules were noted.  She says she doesn't understand what that means and if any further imaging or intervention needs to be done for them.    REVIEW OF SYSTEMS: Dung is tolerating her Tamoxifen well and denies any issues with taking it.  She notes some mild swelling in her ankles, but tells me it can't be anything related to her diet when I asked her what her sodium intake is, because her diet hasn't changed in years.  A detailed review of systems today was otherwise noncontributory.  PAST MEDICAL HISTORY: Past Medical History:  Diagnosis Date  . Cancer ([place])    left breast  . Chronic constipation   . Endometriosis   . Family history of breast cancer   . Family history of prostate cancer   . Headache   . History of radiation therapy 01/21/16- 03/03/16   Left breast/ 50 Gy in 25 fractions. Left Breast Boost/ 10 Gy in 5 Fractions.  . Migraines   . Personal history of chemotherapy   . Personal history of radiation therapy     PAST SURGICAL HISTORY: Past Surgical History:  Procedure Laterality Date  . CESAREAN SECTION    . EXCISION / BIOPSY BREAST / NIPPLE / DUCT Left 08/28/2015  . LAPAROSCOPIC SALPINGO OOPHERECTOMY Right   . TONSILLECTOMY      FAMILY HISTORY Family History  Problem Relation Age of Onset  . Prostate cancer Father 9  Gleason = 7  . Prostate cancer Maternal Uncle        dx in his 20s  . Breast cancer Paternal Aunt        dx in her 36s  . Prostate cancer Paternal Uncle   . Alzheimer's disease Maternal Grandmother   . Lung cancer Maternal Grandfather   . Congestive Heart Failure Paternal Grandmother   . Prostate  cancer Maternal Uncle        dx in his 59s  . Breast cancer Paternal Aunt        dx in her 37s; maternal half paternal aunt  . Prostate cancer Paternal Uncle        dx in his 73s  . Breast cancer Cousin   . Breast cancer Cousin        father's maternal cousin's daughter; dx in her 49s-30s  The patient's parents are still living, in their early 79s as of April 2016. The patient is an only child. On her father's side there are 4 cases of breast cancer, 2 aunts diagnosed in their 7s and 81s, and 2 cousins diagnosed in her 57s and one at age 25. The patient's father had prostate cancer diagnosed age 41.  GYNECOLOGIC HISTORY:  No LMP recorded. Patient is perimenopausal. Menarche age 8, first live birth age 71. The patient is GX P2, although both children were born early. She is status post right salpingo-oophorectomy due to endometriosis. 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 She is divorced. At home is just she and her daughter [name], 74. Her other daughter, [name], is studying criminal justice at [employer]. Her SO is this is with. [name]   ADVANCED DIRECTIVES: Not in place   HEALTH MAINTENANCE: Social History   Tobacco Use  . Smoking status: Never Smoker  . Smokeless tobacco: Never Used  Substance Use Topics  . Alcohol use: Yes    Comment: social  . Drug use: No     Colonoscopy:  PAP:  Bone density:  Lipid panel:  Allergies  Allergen Reactions  . Tetracyclines & Related Itching  . Other Rash    Powder in gloves causes rash    Current Outpatient Medications  Medication Sig Dispense Refill  . tamoxifen (NOLVADEX) 20 MG tablet      No current facility-administered medications for this visit.     OBJECTIVE:  Vitals:   04/14/17 1427  BP: 127/72  Pulse: 66  Resp: 20  Temp: 98 F (36.7 C)  SpO2: 100%     Body mass index is 29.09 kg/m.     ECOG FS:0 - Asymptomatic GENERAL: Patient is a well appearing female in no acute distress HEENT:  Sclerae anicteric.  Oropharynx clear and moist. No ulcerations or evidence of oropharyngeal candidiasis. Neck is supple.  NODES:  No cervical, supraclavicular, or axillary lymphadenopathy palpated.  BREAST EXAM:  Left breast s/p lumpectomy, scar tissue is well healed, no nodules, masses,nipple changes, right breast without nodules, masses, skin or nipple changes. LUNGS:  Clear to auscultation bilaterally.  No wheezes or rhonchi. HEART:  Regular rate and rhythm. No murmur appreciated. ABDOMEN:  Soft, nontender.  Positive, normoactive bowel sounds. No organomegaly palpated. MSK:  No focal spinal tenderness to palpation. Full range of motion bilaterally in the upper extremities. EXTREMITIES:  No peripheral edema.   SKIN:  Clear with no obvious rashes or skin changes. No nail  dyscrasia. NEURO:  Nonfocal. Well oriented.  Appropriate affect.      LAB RESULTS:  CMP     Component Value Date/Time   NA 143 09/29/2016 1016   K 4.3 09/29/2016 1016   CL 109 08/07/2016 1254   CO2 26 09/29/2016 1016   GLUCOSE 103 09/29/2016 1016   BUN 13.4 09/29/2016 1016   CREATININE 1.0 09/29/2016 1016   CALCIUM 9.6 09/29/2016 1016   PROT 7.3 09/29/2016 1016   ALBUMIN 4.0 09/29/2016 1016   AST 38 (H) 09/29/2016 1016   ALT 42 09/29/2016 1016   ALKPHOS 104 09/29/2016 1016   BILITOT 0.54 09/29/2016 1016   GFRNONAA >60 08/07/2016 1254   GFRAA >60 08/07/2016 1254    INo results found for: SPEP, UPEP  Lab Results  Component Value Date   WBC 6.0 04/14/2017   NEUTROABS 2.8 04/14/2017   HGB 11.9 04/14/2017   HCT 37.6 04/14/2017   MCV 88.1 04/14/2017   PLT 210 04/14/2017      Chemistry      Component Value Date/Time   NA 143 09/29/2016 1016   K 4.3 09/29/2016 1016   CL 109 08/07/2016 1254   CO2 26 09/29/2016 1016   BUN 13.4 09/29/2016 1016   CREATININE 1.0 09/29/2016 1016      Component Value  Date/Time   CALCIUM 9.6 09/29/2016 1016   ALKPHOS 104 09/29/2016 1016   AST 38 (H) 09/29/2016 1016   ALT 42 09/29/2016 1016   BILITOT 0.54 09/29/2016 1016       No results found for: LABCA2  No components found for: LABCA125  No results for input(s): INR in the last 168 hours.  Urinalysis    Component Value Date/Time   COLORURINE YELLOW 04/07/2016 2007   APPEARANCEUR CLOUDY (A) 04/07/2016 2007   LABSPEC 1.028 04/07/2016 2007   LABSPEC 1.015 10/17/2015 1102   PHURINE 8.5 (H) 04/07/2016 2007   GLUCOSEU NEGATIVE 04/07/2016 2007   GLUCOSEU Negative 10/17/2015 1102   HGBUR NEGATIVE 04/07/2016 2007   BILIRUBINUR NEGATIVE 04/07/2016 2007   BILIRUBINUR Negative 10/17/2015 1102   KETONESUR 40 (A) 04/07/2016 2007   PROTEINUR 30 (A) 04/07/2016 2007   UROBILINOGEN 0.2 10/17/2015 1102   NITRITE NEGATIVE 04/07/2016 2007   LEUKOCYTESUR TRACE (A) 04/07/2016 2007   LEUKOCYTESUR Negative 10/17/2015 1102      ELIGIBLE FOR AVAILABLE RESEARCH PROTOCOL: PALLAS  STUDIES: Mammography at [facility] 07/24/2016 found the breast density to be category C. There was no evidence of malignancy.  ASSESSMENT: 51 y.o. [place] woman status post left breast upper outer quadrant biopsy 08/03/2015 for a clinical T2 N0, stage IIa invasive ductal carcinoma, grade 3, estrogen and progesterone receptor positive, HER-2 not amplified, with an MIB-1 of 70%  (1) status post left lumpectomy and sentinel lymph node sampling 08/28/2015 for a pT2 pN0, stage IIA invasive ductal carcinoma, grade 3, with close but negative margins. Repeat HER-2 was again negative  (a) oncotype DX Score of 22 predicts a risk of recurrence outside the breast within 10 years of 14% if the patient's only systemic therapy is tamoxifen for 5 years. The addition of CAF reduced the risk an additional 4 %  (2) adjuvant chemotherapy consisting of cyclophosphamide and  docetaxel started 10/11/2015  (a) changed to cyclophosphamide and  doxorubicin for 3 cycles, after first cycle of TC because of elevated LFTs, completed 12/13/2015  (3) adjuvant radiation 01/21/2016-03/03/2016   (a) possibly radiation associated thyrotoxicosis--resolving  (4) started tamoxifen 05/02/2016  (a) bone density 12/20/2015 shows  mild osteopenia, with a T score of -1.5.  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  She has no sign of recurrence.  I went ahead and ordered her mammogram for 07/2017 when it is due.  She will continue taking Tamoxifen as she is tolerating it well.  She wanted a TSH done today, however it wasn't initially drawn with her labs and we could not add it on.  She declined getting stuck again, and we will get it in the future should she want it drawn.  I read her the ultrasound report on her thyroid that indicated she had small cysts and no follow up imaging was needed.  She appreciated the reassurance.    We will see Aimee in 6 months for labs and follow up with Dr. [name].  She knows to call for any questions or concerns prior to her next appointment with [place].    A total of (30) minutes of face-to-face time was spent with this patient with greater than 50% of that time in counseling and care-coordination.    Scot Dock, NP   04/14/2017 2:44 PM

## 2017-04-14 NOTE — Telephone Encounter (Signed)
Gave patient AVS and calendar of upcoming may appointments.  °

## 2017-06-16 IMAGING — CR DG ABDOMEN ACUTE W/ 1V CHEST
3 series · 3 of 3 positions shown · non-contrast
Comparison: 10/18/2010.

CLINICAL DATA: Abdominal pain and cramping with nausea and vomiting
for the past 2 days. Undergoing chemotherapy for breast cancer.

EXAM:
DG ABDOMEN ACUTE W/ 1V CHEST

[w chest pa]
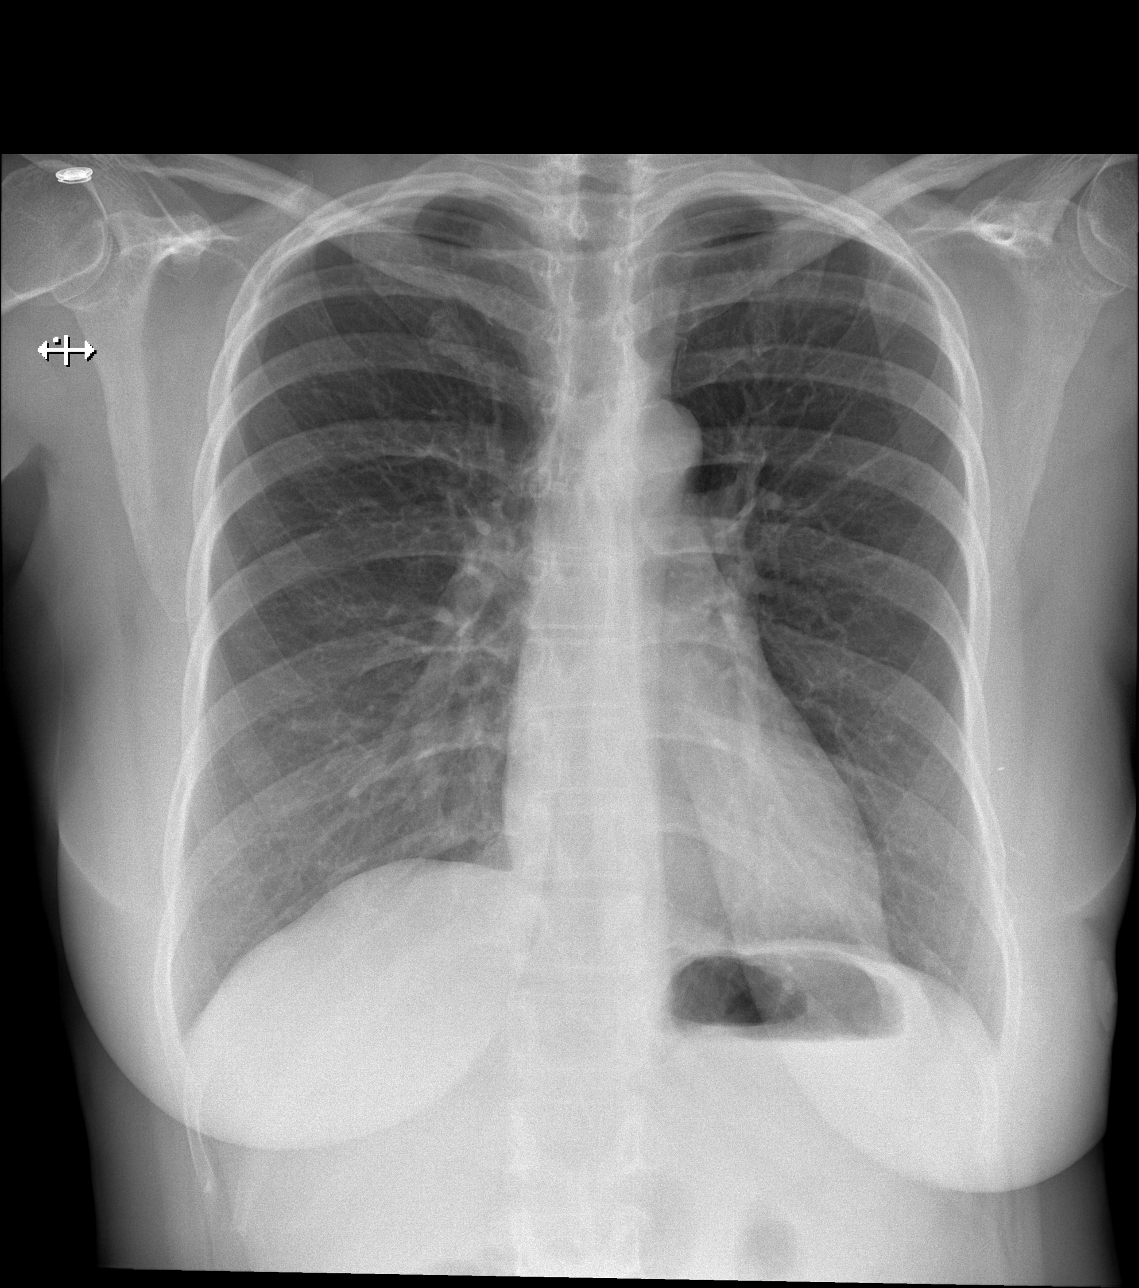

[w abdomen upright]
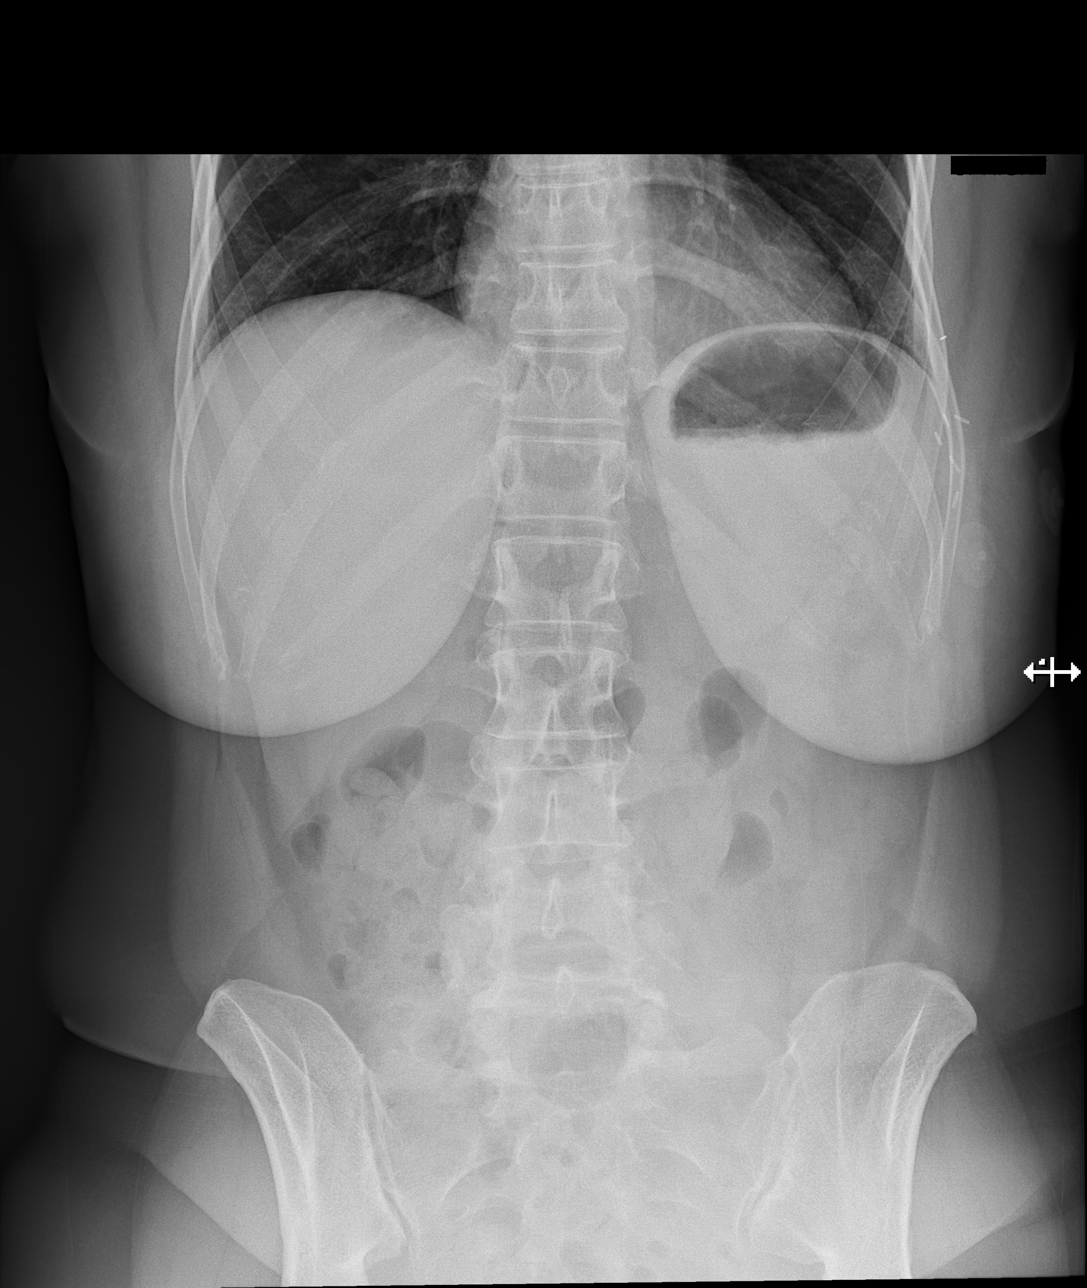

[t abdomen supine]
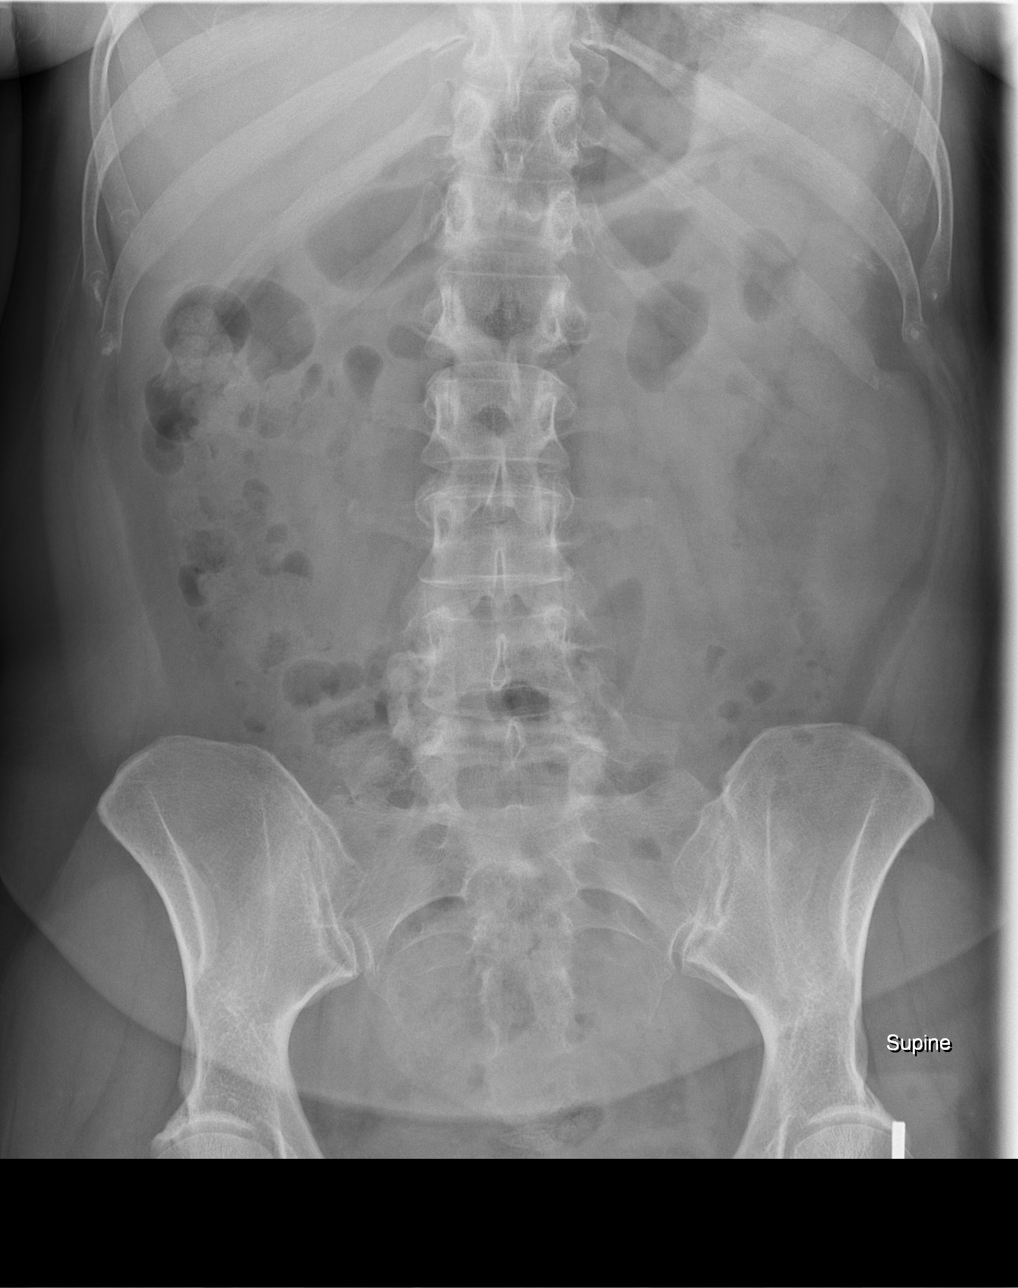

[3 of 3 positions shown; findings below may reference images not displayed]

FINDINGS: Normal sized heart. Clear lungs. Left breast surgical clips. Normal
bowel gas pattern without free peritoneal air. Bilateral pelvic
phleboliths. Unremarkable bones.
IMPRESSION: No acute abnormality.

## 2017-06-23 IMAGING — DX DG ABDOMEN 2V
3 series · 3 of 3 positions shown · non-contrast
Comparison: 04/07/2016 chest and abdomen radiographs.

CLINICAL DATA: Left breast cancer. Vomiting and diarrhea.
Dehydration.

EXAM:
ABDOMEN - 2 VIEW

[abdomen erect]
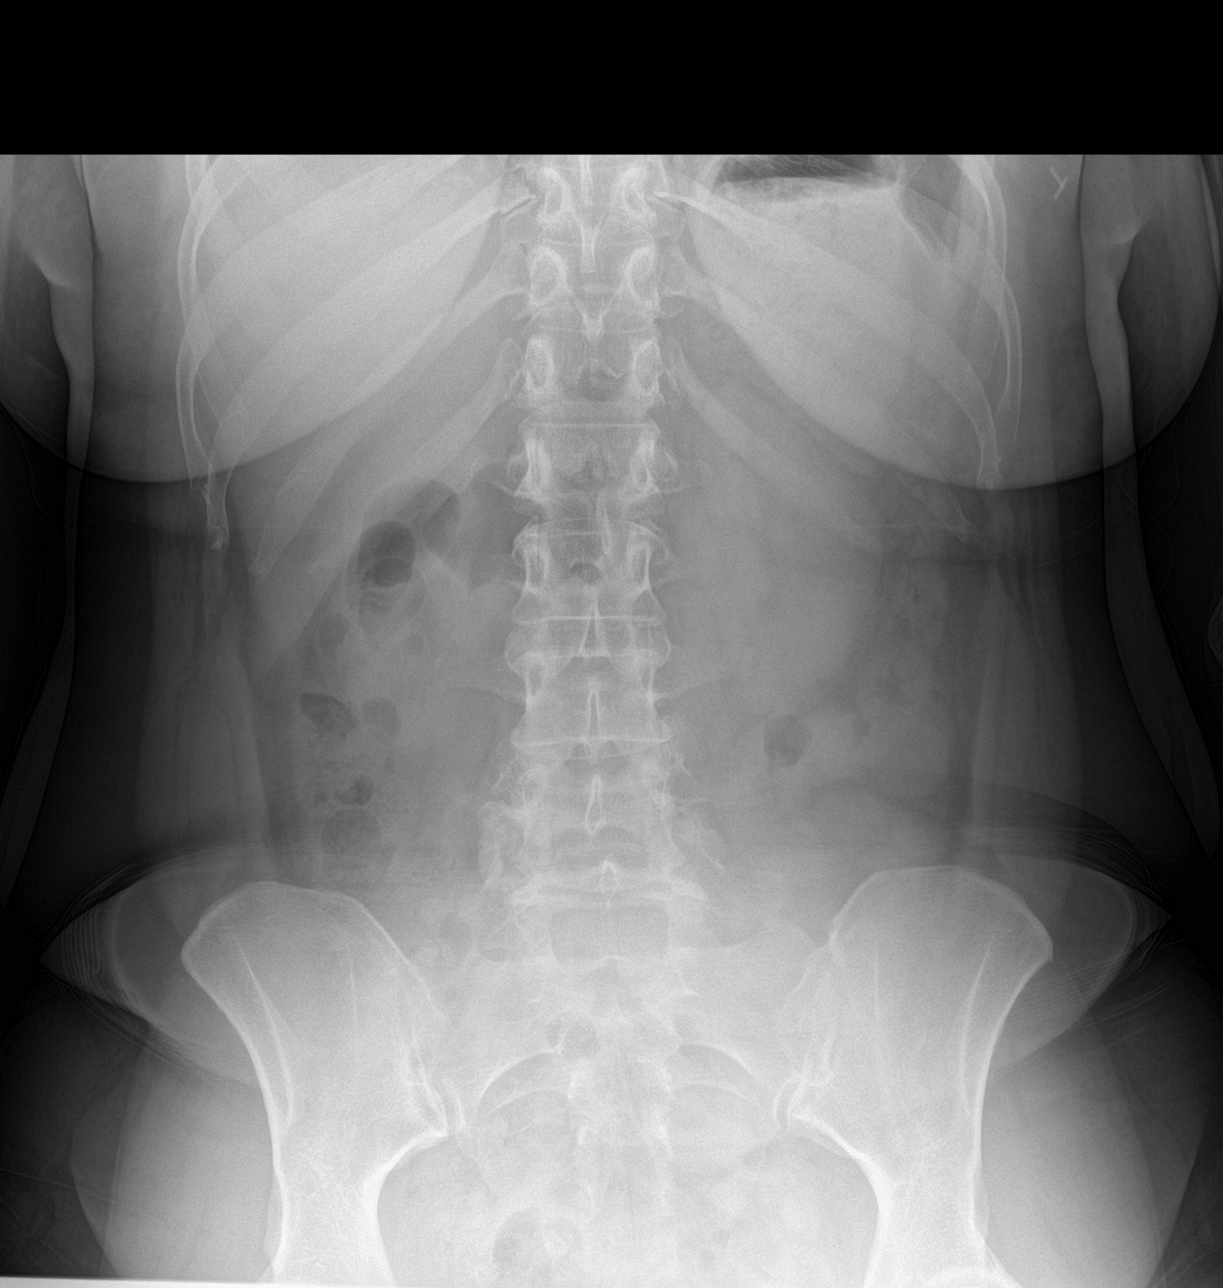

[abdomen supine (1 of 2)]
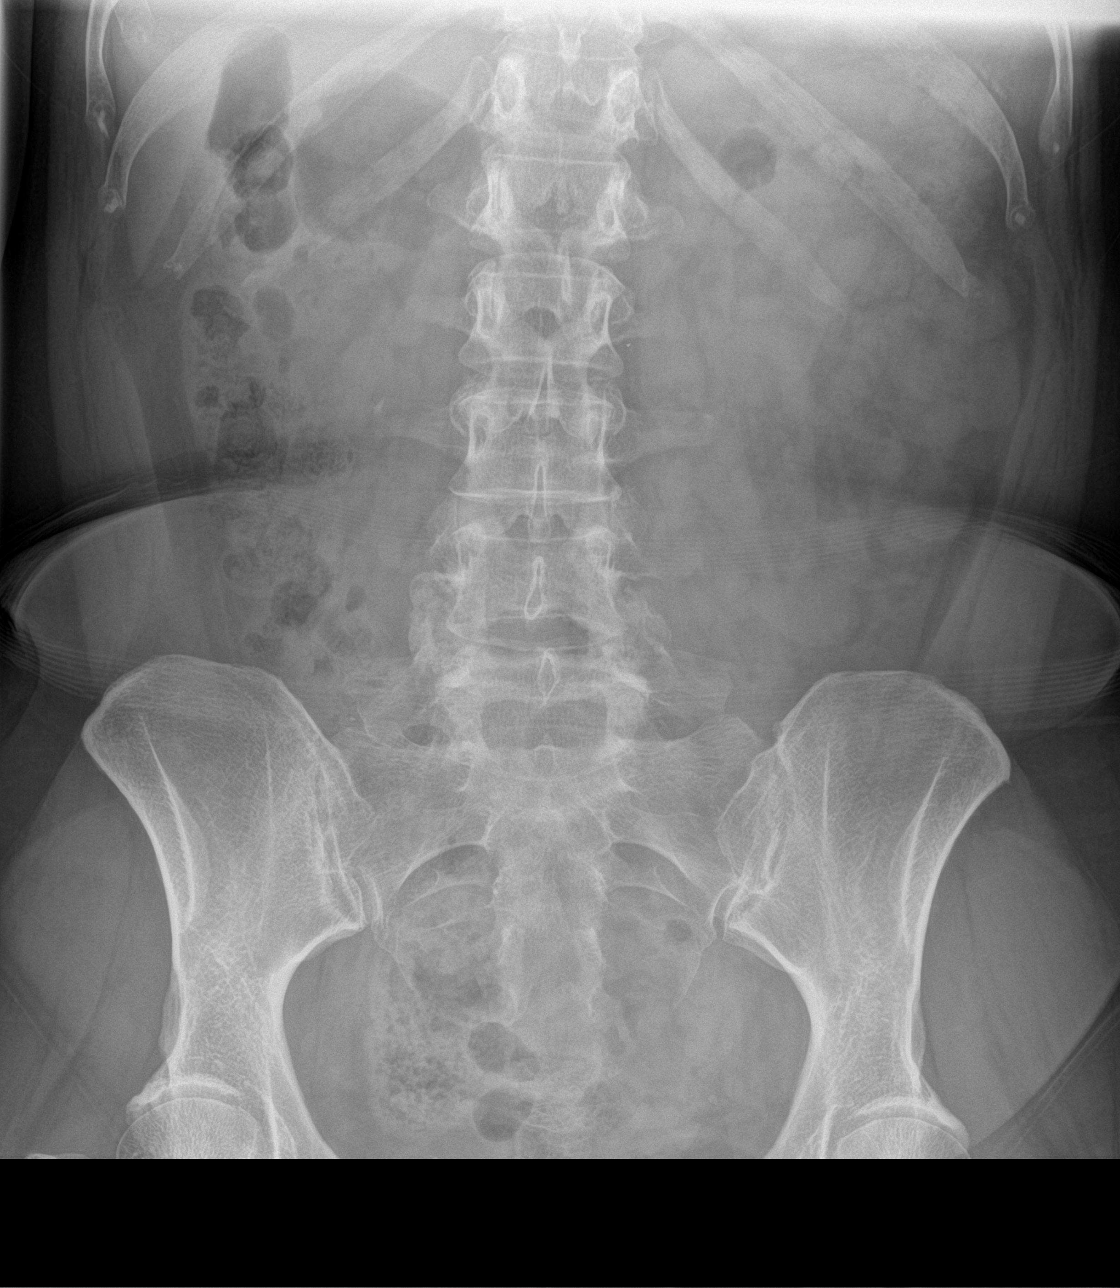

[abdomen supine (2 of 2)]
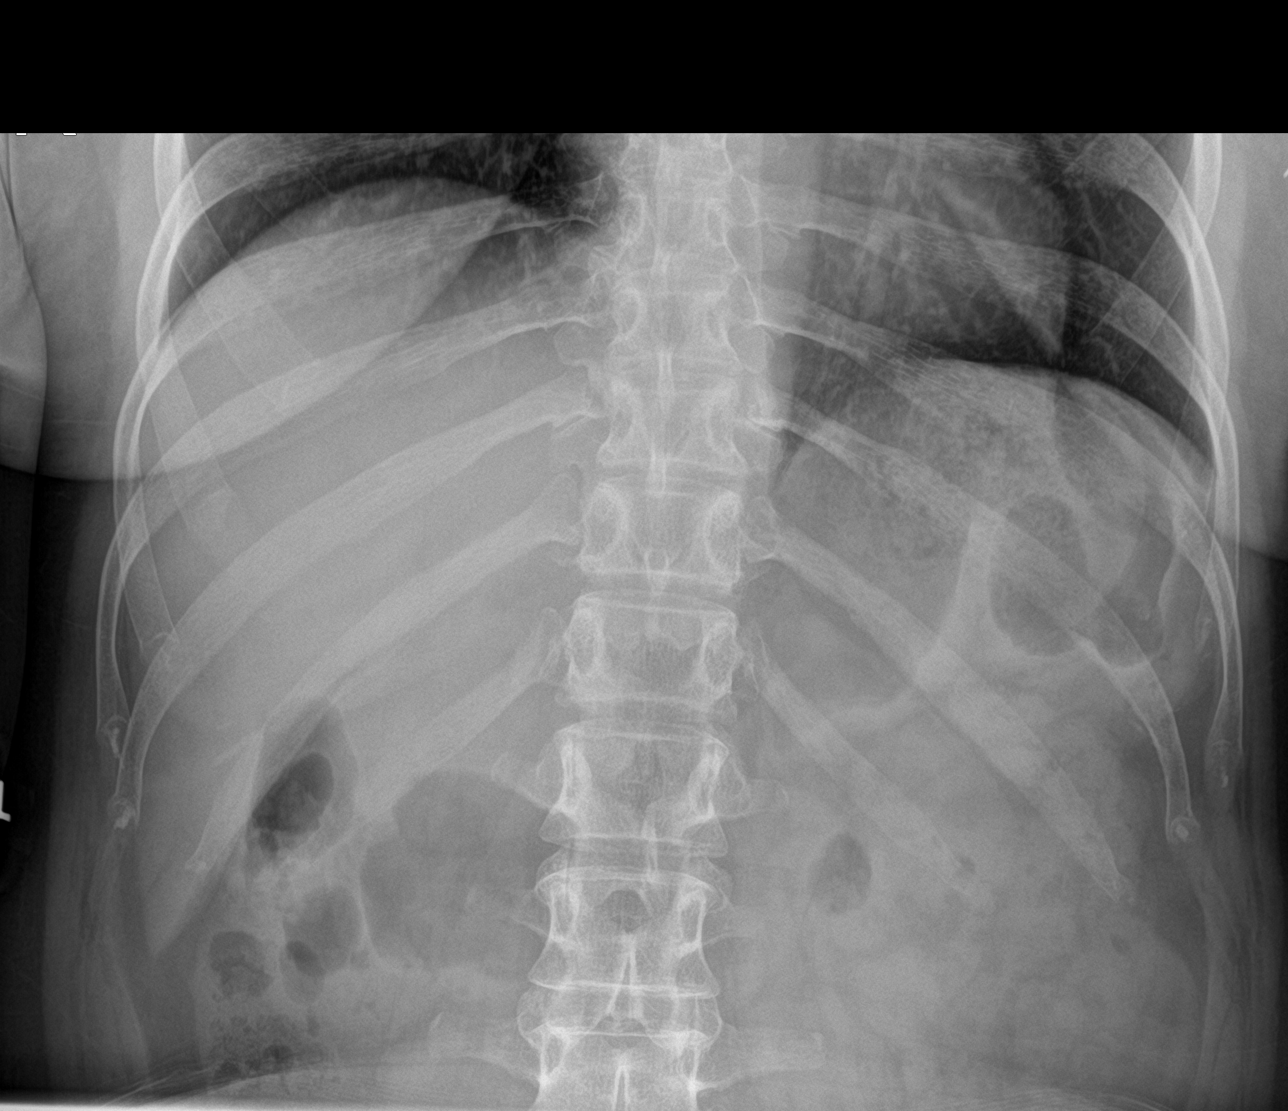

[3 of 3 positions shown; findings below may reference images not displayed]

FINDINGS: No dilated small bowel loops or air-fluid levels. Physiologic
colonic stool volume. No evidence of pneumatosis, pneumoperitoneum
or pathologic soft tissue calcification. Surgical clips overlie the
lateral left breast. Lung bases appear clear.
IMPRESSION: Nonobstructive bowel gas pattern.

## 2017-07-16 ENCOUNTER — Other Ambulatory Visit: Payer: Self-pay | Admitting: Oncology

## 2017-07-27 ENCOUNTER — Ambulatory Visit
Admission: RE | Admit: 2017-07-27 | Discharge: 2017-07-27 | Disposition: A | Discharge status: Home / Self Care | Payer: 59 | Source: Ambulatory Visit | Attending: Adult Health | Admitting: Adult Health

## 2017-07-27 DIAGNOSIS — C50412 Malignant neoplasm of upper-outer quadrant of left female breast: Secondary | ICD-10-CM

## 2017-07-27 DIAGNOSIS — Z170 Estrogen receptor positive status [ER+]: Principal | ICD-10-CM

## 2017-10-05 ENCOUNTER — Ambulatory Visit (HOSPITAL_COMMUNITY)
Admission: RE | Admit: 2017-10-05 | Discharge: 2017-10-05 | Disposition: A | Discharge status: Home / Self Care | Payer: 59 | Source: Ambulatory Visit | Attending: Oncology | Admitting: Oncology

## 2017-10-05 ENCOUNTER — Telehealth: Payer: Self-pay | Admitting: *Deleted

## 2017-10-05 DIAGNOSIS — M25551 Pain in right hip: Secondary | ICD-10-CM

## 2017-10-05 DIAGNOSIS — C50412 Malignant neoplasm of upper-outer quadrant of left female breast: Secondary | ICD-10-CM

## 2017-10-05 DIAGNOSIS — M160 Bilateral primary osteoarthritis of hip: Secondary | ICD-10-CM | POA: Insufficient documentation

## 2017-10-05 DIAGNOSIS — Z170 Estrogen receptor positive status [ER+]: Secondary | ICD-10-CM | POA: Diagnosis present

## 2017-10-05 NOTE — Telephone Encounter (Signed)
This RN spoke with pt per her call stating concern due to " pain in my right hip"  Brendalyn states she has had  " mild pain in this hip for the past 2 years that is very intermittent" " but pain increased to severe - starting yesterday that takes my breath away and is difficult to walk "  Pain is sudden, severe and then goes   Pain is not associated with any activity " can happen while walking, sitting or laying down "  Pain occurs " right in the middle of the hip joint "  Denies any radiating pain down leg.  Plan per phone discussion is patient will proceed with plain film of right hip for review and then further follow up per reading.  Order entered.

## 2017-10-06 ENCOUNTER — Telehealth: Payer: Self-pay | Admitting: Medical Oncology

## 2017-10-06 NOTE — Telephone Encounter (Signed)
Please call her with result of R hip xray.

## 2017-10-07 ENCOUNTER — Telehealth: Payer: Self-pay | Admitting: *Deleted

## 2017-10-07 NOTE — Telephone Encounter (Signed)
Voicemail received requesting "Return call [phone] with results of right hip xray".  Message forwarded to [facility] for further patient communication.

## 2017-10-07 NOTE — Telephone Encounter (Signed)
This RN called pt and and informed her of readings for hip films.   Per discussion this RN discussed "degenerative changes" as well as " sclerosis with spurring " with follow up best managed by her primary MD and then if needed proceeding to an orthopedic MD.  Ashley Tyler verbalized understanding and verified her primary MD as Dr Lavone Orn.  This note with reading of hip films will be forwarded to her primary MD.

## 2017-10-13 NOTE — Progress Notes (Signed)
[place]  Telephone:([phone]) [phone] Fax:(336) [phone]   ID: Ashley Tyler DOB: 1965/06/23  MR#: [id number]  OJJ#:[id number]  Patient Care Team: Lavone Orn, MD as PCP - General (Internal Medicine) Allyana Vogan, Virgie Dad, MD as Consulting Physician (Oncology) [name], MD as Consulting Physician (General Surgery) [name], MD as Attending Physician (Radiation Oncology) Philemon Kingdom, MD as Consulting Physician (Internal Medicine) Larey Dresser, MD as Consulting Physician (Cardiology) PCP: Lavone Orn, MD GYN: OTHER MD:  CHIEF COMPLAINT: Estrogen receptor positive breast cancer  CURRENT TREATMENT: Tamoxifen  BREAST CANCER HISTORY: From the original intake note:  Ashley Tyler had screening mammography at Dr. Caralee Ates office 07/24/2015 suggesting a left breast mass and the patient was referred to the [facility] where on 07/30/2015 she underwent left diagnostic mammography with tomosynthesis and left breast ultrasonography. The breast density was category C. In the upper outer quadrant of the left breast there was a 2.3 cm spiculated mass. There were no associated calcifications. This mass was palpable at the 1:00 position 5 cm from the nipple. Ultrasound confirmed an irregular hypoechoic mass measuring 1.8 cm. Ultrasonography of the left axilla was negative.  On 08/03/2015 the patient underwent biopsy of the left breast mass in question. 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  The patient then met with surgery and after appropriate discussion she proceeded to left lumpectomy and sentinel lymph node sampling 08/28/2015. The final pathology ([id number]) showed invasive ductal carcinoma, grade 3, measuring 2.1  cm. Margins were close but negative. All 3 sentinel lymph nodes were clear. Repeat HER-2 was again negative, with a signals ratio of 0.9, and number per cell 2.20.  INTERVAL HISTORY: Ashley Tyler returns today for follow-up of her breast cancer. She continues on tamoxifen, with good tolerance. She has hot flashes mostly during the night. She previously tried gabapentin, but it makes her feel drowsy. She denies issues with increased vaginal discharge.   Since her last visit, she underwent diagnostic bilateral mammography with CAD and tomography on 07/27/2017 at [facility] showing: breast density category B. There was no evidence of malignancy.   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 No acute abnormality.   REVIEW OF SYSTEMS: Ashley Tyler reports that she first noticed her right hip pain in December 2017 and a few times in 2018. She had a lot of right hip pain a week ago that felt like sharp spasms that only lasted for a few seconds. On 10/11/2017, she had intermittent pain throughout the day. She reports that she got a new car that is lower, which could have triggered the pain. She wasn't particularly active that day. She also felt the pain a few times yesterday. She continues to work regularly. She denies unusual headaches, visual changes, nausea, vomiting, or dizziness. There has been no unusual cough, phlegm production, or pleurisy. This been no change in bowel or bladder habits. She denies unexplained fatigue or unexplained weight loss, bleeding, rash, or fever. A detailed review of systems was otherwise stable.    PAST MEDICAL HISTORY: Past Medical History:  Diagnosis Date  . Cancer ([place])    left breast  . Chronic constipation   . Endometriosis   . Family history of breast cancer   .  Family history of prostate cancer   . Headache   . History of radiation therapy 01/21/16- 03/03/16   Left breast/ 50 Gy in 25  fractions. Left Breast Boost/ 10 Gy in 5 Fractions.  . Migraines   . Personal history of chemotherapy   . Personal history of radiation therapy     PAST SURGICAL HISTORY: Past Surgical History:  Procedure Laterality Date  . BREAST LUMPECTOMY Left 08/2015  . BREAST LUMPECTOMY WITH RADIOACTIVE SEED AND SENTINEL LYMPH NODE BIOPSY Left 08/28/2015   Procedure: LEFT BREAST LUMPECTOMY WITH RADIOACTIVE SEED AND SENTINEL LYMPH NODE BIOPSY;  Surgeon: [name], MD;  Location: [facility];  Service: General;  Laterality: Left;  . CESAREAN SECTION    . EXCISION / BIOPSY BREAST / NIPPLE / DUCT Left 08/28/2015  . LAPAROSCOPIC SALPINGO OOPHERECTOMY Right   . TONSILLECTOMY      FAMILY HISTORY Family History  Problem Relation Age of Onset  . Prostate cancer Father 79       Gleason = 7  . Prostate cancer Maternal Uncle        dx in his 87s  . Breast cancer Paternal Aunt        dx in her 59s  . Prostate cancer Paternal Uncle   . Alzheimer's disease Maternal Grandmother   . Lung cancer Maternal Grandfather   . Congestive Heart Failure Paternal Grandmother   . Prostate cancer Maternal Uncle        dx in his 55s  . Breast cancer Paternal Aunt        dx in her 28s; maternal half paternal aunt  . Prostate cancer Paternal Uncle        dx in his 47s  . Breast cancer Cousin   . Breast cancer Cousin        father's maternal cousin's daughter; dx in her 66s-30s  The patient's parents are still living, in their early 71s as of April 2016. The patient is an only child. On her father's side there are 4 cases of breast cancer, 2 aunts diagnosed in their 71s and 61s, and 2 cousins diagnosed in her 57s and one at age 35. The patient's father had prostate cancer diagnosed age 71.  GYNECOLOGIC HISTORY:  No LMP recorded. Patient is perimenopausal. Menarche age 30, first live birth age 2. The patient is GX P2, although both children were born early. She is status post right  salpingo-oophorectomy due to endometriosis. 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 At home is just she and her daughter [name], 67. Her other daughter, [name] age 22, is studying criminal justice at [employer]. The patient's significant other is [name]   ADVANCED DIRECTIVES: Not in place   HEALTH MAINTENANCE: Social History   Tobacco Use  . Smoking status: Never Smoker  . Smokeless tobacco: Never Used  Substance Use Topics  . Alcohol use: Yes    Comment: social  . Drug use: No     Colonoscopy:  PAP:  Bone density:  Lipid panel:  Allergies  Allergen Reactions  . Tetracyclines & Related Itching  . Other Rash    Powder in gloves causes rash    Current Outpatient Medications  Medication Sig Dispense Refill  . tamoxifen (NOLVADEX) 20 MG tablet     . tamoxifen (NOLVADEX) 20 MG tablet TAKE 1 TABLET BY MOUTH EVERY DAY 90 tablet  0   No current facility-administered medications for this visit.     OBJECTIVE: Middle-aged African-American woman who appears stated age 65:   10/14/17 1502  BP: 119/80  Pulse: 71  Resp: 18  Temp: 98.3 F (36.8 C)  SpO2: 100%     Body mass index is 30.5 kg/m.    ECOG FS:1 - Symptomatic but completely ambulatory   Sclerae unicteric, EOMs intact Oropharynx clear and moist No cervical or supraclavicular adenopathy Lungs no rales or rhonchi Heart regular rate and rhythm Abd soft, nontender, positive bowel sounds MSK no focal spinal tenderness, no upper extremity lymphedema Neuro: nonfocal, well oriented, appropriate affect Breasts: The right breast is unremarkable.  The left breast is status post lumpectomy and radiation.  There is still some residual hyperpigmentation but there is no evidence of disease recurrence.  Both axillae are benign.  LAB RESULTS:  CMP     Component Value Date/Time   NA 140  10/14/2017 1435   NA 143 04/14/2017 1413   K 4.2 10/14/2017 1435   K 3.6 04/14/2017 1413   CL 106 10/14/2017 1435   CO2 27 10/14/2017 1435   CO2 29 04/14/2017 1413   GLUCOSE 95 10/14/2017 1435   GLUCOSE 100 04/14/2017 1413   BUN 20 10/14/2017 1435   BUN 12.7 04/14/2017 1413   CREATININE 1.05 10/14/2017 1435   CREATININE 0.9 04/14/2017 1413   CALCIUM 8.9 10/14/2017 1435   CALCIUM 9.0 04/14/2017 1413   PROT 6.9 10/14/2017 1435   PROT 6.6 04/14/2017 1413   ALBUMIN 3.8 10/14/2017 1435   ALBUMIN 3.5 04/14/2017 1413   AST 19 10/14/2017 1435   AST 17 04/14/2017 1413   ALT 18 10/14/2017 1435   ALT 17 04/14/2017 1413   ALKPHOS 72 10/14/2017 1435   ALKPHOS 68 04/14/2017 1413   BILITOT <0.2 (L) 10/14/2017 1435   BILITOT 0.22 04/14/2017 1413   GFRNONAA 60 (L) 10/14/2017 1435   GFRAA >60 10/14/2017 1435    INo results found for: SPEP, UPEP  Lab Results  Component Value Date   WBC 6.4 10/14/2017   NEUTROABS 3.5 10/14/2017   HGB 12.8 10/14/2017   HCT 39.6 10/14/2017   MCV 85.4 10/14/2017   PLT 234 10/14/2017      Chemistry      Component Value Date/Time   NA 140 10/14/2017 1435   NA 143 04/14/2017 1413   K 4.2 10/14/2017 1435   K 3.6 04/14/2017 1413   CL 106 10/14/2017 1435   CO2 27 10/14/2017 1435   CO2 29 04/14/2017 1413   BUN 20 10/14/2017 1435   BUN 12.7 04/14/2017 1413   CREATININE 1.05 10/14/2017 1435   CREATININE 0.9 04/14/2017 1413      Component Value Date/Time   CALCIUM 8.9 10/14/2017 1435   CALCIUM 9.0 04/14/2017 1413   ALKPHOS 72 10/14/2017 1435   ALKPHOS 68 04/14/2017 1413   AST 19 10/14/2017 1435   AST 17 04/14/2017 1413   ALT 18 10/14/2017 1435   ALT 17 04/14/2017 1413   BILITOT <0.2 (L) 10/14/2017 1435   BILITOT 0.22 04/14/2017 1413       No results found for: LABCA2  No components found for: LABCA125  No results for input(s): INR in the last 168 hours.  Urinalysis    Component Value Date/Time   COLORURINE YELLOW 04/07/2016 2007    APPEARANCEUR CLOUDY (A) 04/07/2016 2007   LABSPEC 1.028 04/07/2016 2007   LABSPEC 1.015 10/17/2015 1102   PHURINE 8.5 (H) 04/07/2016 2007  GLUCOSEU NEGATIVE 04/07/2016 2007   GLUCOSEU Negative 10/17/2015 1102   HGBUR NEGATIVE 04/07/2016 2007   BILIRUBINUR NEGATIVE 04/07/2016 2007   BILIRUBINUR Negative 10/17/2015 1102   KETONESUR 40 (A) 04/07/2016 2007   PROTEINUR 30 (A) 04/07/2016 2007   UROBILINOGEN 0.2 10/17/2015 1102   NITRITE NEGATIVE 04/07/2016 2007   LEUKOCYTESUR TRACE (A) 04/07/2016 2007   LEUKOCYTESUR Negative 10/17/2015 1102      ELIGIBLE FOR AVAILABLE RESEARCH PROTOCOL: no  STUDIES: Since her last visit, she underwent diagnostic bilateral mammography with CAD and tomography on 07/27/2017 at [facility] showing: breast density category B. There was no evidence of malignancy.   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 No acute abnormality.  ASSESSMENT: 52 y.o. [place] woman status post left breast upper outer quadrant biopsy 08/03/2015 for a clinical T2 N0, stage IIa invasive ductal carcinoma, grade 3, estrogen and progesterone receptor positive, HER-2 not amplified, with an MIB-1 of 70%  (1) status post left lumpectomy and sentinel lymph node sampling 08/28/2015 for a pT2 pN0, stage IIA invasive ductal carcinoma, grade 3, with close but negative margins. Repeat HER-2 was again negative  (a) oncotype DX Score of 22 predicts a risk of recurrence outside the breast within 10 years of 14% if the patient's only systemic therapy is tamoxifen for 5 years. 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  This is very favorable.  She is tolerating tamoxifen moderately well.  She was unable to tolerate gabapentin.  We discussed TTS 1 patches but her blood pressure currently is quite well controlled.  At present then we are simply continuing the tamoxifen.  For some reason she thought today was her last visit here.  I explained she needs to be on tamoxifen a total of 5 years and that we generally see patients until they are off that drug.  She is agreeable to continuing follow-up here  I gave her a copy of the right hip film which is reassuring.  She has significant arthritis there.  I offered her referral to an orthopedist or referral to our physical therapy department.  She opted for the latter.  This referral was placed.  Otherwise she will see me again in 1 year.  She knows to call for any other issues that may develop before the next visit.   Cortney Beissel, Virgie Dad, MD  10/14/17 3:37 PM Medical Oncology and Hematology [facility] [address] [place], [place] [address] Tel. [phone]    Fax. [phone]  This document serves as a record of services personally performed by Lurline Del, MD. It  was created on his behalf by Sheron Nightingale, a trained medical scribe. The creation of this record is based on the scribe's personal observations and the provider's statements  to them.   I have reviewed the above documentation for accuracy and completeness, and I agree with the above.

## 2017-10-14 ENCOUNTER — Telehealth: Payer: Self-pay | Admitting: Oncology

## 2017-10-14 ENCOUNTER — Inpatient Hospital Stay (HOSPITAL_BASED_OUTPATIENT_CLINIC_OR_DEPARTMENT_OTHER): Payer: 59 | Admitting: Oncology

## 2017-10-14 ENCOUNTER — Inpatient Hospital Stay: Payer: 59 | Attending: Oncology

## 2017-10-14 VITALS — BP 119/80 | HR 71 | Temp 98.3°F | Resp 18 | Ht 65.5 in | Wt 186.1 lb

## 2017-10-14 DIAGNOSIS — Z79810 Long term (current) use of selective estrogen receptor modulators (SERMs): Secondary | ICD-10-CM | POA: Insufficient documentation

## 2017-10-14 DIAGNOSIS — C50412 Malignant neoplasm of upper-outer quadrant of left female breast: Secondary | ICD-10-CM

## 2017-10-14 DIAGNOSIS — M25551 Pain in right hip: Secondary | ICD-10-CM | POA: Insufficient documentation

## 2017-10-14 DIAGNOSIS — M160 Bilateral primary osteoarthritis of hip: Secondary | ICD-10-CM

## 2017-10-14 DIAGNOSIS — M5137 Other intervertebral disc degeneration, lumbosacral region: Secondary | ICD-10-CM

## 2017-10-14 DIAGNOSIS — E0590 Thyrotoxicosis, unspecified without thyrotoxic crisis or storm: Secondary | ICD-10-CM

## 2017-10-14 DIAGNOSIS — Z170 Estrogen receptor positive status [ER+]: Secondary | ICD-10-CM

## 2017-10-14 LAB — CBC WITH DIFFERENTIAL/PLATELET
BASOS ABS: 0.1 10*3/uL (ref 0.0–0.1)
Basophils Relative: 1 %
EOS ABS: 0.1 10*3/uL (ref 0.0–0.5)
EOS PCT: 2 %
HCT: 39.6 % (ref 34.8–46.6)
Hemoglobin: 12.8 g/dL (ref 11.6–15.9)
Lymphocytes Relative: 38 %
Lymphs Abs: 2.4 10*3/uL (ref 0.9–3.3)
MCH: 27.6 pg (ref 25.1–34.0)
MCHC: 32.3 g/dL (ref 31.5–36.0)
MCV: 85.4 fL (ref 79.5–101.0)
Monocytes Absolute: 0.3 10*3/uL (ref 0.1–0.9)
Monocytes Relative: 5 %
NEUTROS PCT: 54 %
Neutro Abs: 3.5 10*3/uL (ref 1.5–6.5)
PLATELETS: 234 10*3/uL (ref 145–400)
RBC: 4.64 MIL/uL (ref 3.70–5.45)
RDW: 14.1 % (ref 11.2–14.5)
WBC: 6.4 10*3/uL (ref 3.9–10.3)

## 2017-10-14 LAB — COMPREHENSIVE METABOLIC PANEL
ALBUMIN: 3.8 g/dL (ref 3.5–5.0)
ALT: 18 U/L (ref 0–55)
AST: 19 U/L (ref 5–34)
Alkaline Phosphatase: 72 U/L (ref 40–150)
Anion gap: 7 (ref 3–11)
BUN: 20 mg/dL (ref 7–26)
CHLORIDE: 106 mmol/L (ref 98–109)
CO2: 27 mmol/L (ref 22–29)
CREATININE: 1.05 mg/dL (ref 0.60–1.10)
Calcium: 8.9 mg/dL (ref 8.4–10.4)
GFR calc Af Amer: >60 mL/min (ref >60)
GFR calc non Af Amer: 60 mL/min — ABNORMAL LOW (ref >60)
GLUCOSE: 95 mg/dL (ref 70–140)
Potassium: 4.2 mmol/L (ref 3.5–5.1)
SODIUM: 140 mmol/L (ref 136–145)
Total Bilirubin: <0.2 mg/dL — ABNORMAL LOW (ref 0.2–1.2)
Total Protein: 6.9 g/dL (ref 6.4–8.3)

## 2017-10-14 NOTE — Telephone Encounter (Signed)
Gave patient AVs and calendar of upcoming may 2020 appointments °

## 2017-10-15 LAB — TSH: TSH: 1.106 u[IU]/mL (ref 0.308–3.960)

## 2017-11-08 ENCOUNTER — Other Ambulatory Visit: Payer: Self-pay | Admitting: Oncology

## 2018-03-01 ENCOUNTER — Other Ambulatory Visit: Payer: Self-pay | Admitting: Oncology

## 2018-06-09 ENCOUNTER — Other Ambulatory Visit: Payer: Self-pay | Admitting: Oncology

## 2018-06-09 DIAGNOSIS — Z853 Personal history of malignant neoplasm of breast: Secondary | ICD-10-CM

## 2018-06-20 ENCOUNTER — Other Ambulatory Visit: Payer: Self-pay | Admitting: Oncology

## 2018-07-17 ENCOUNTER — Other Ambulatory Visit: Payer: Self-pay | Admitting: Nurse Practitioner

## 2018-07-29 ENCOUNTER — Ambulatory Visit
Admission: RE | Admit: 2018-07-29 | Discharge: 2018-07-29 | Disposition: A | Discharge status: Home / Self Care | Payer: 59 | Source: Ambulatory Visit | Attending: Oncology | Admitting: Oncology

## 2018-07-29 DIAGNOSIS — Z853 Personal history of malignant neoplasm of breast: Secondary | ICD-10-CM

## 2018-08-11 ENCOUNTER — Other Ambulatory Visit: Payer: Self-pay | Admitting: Obstetrics and Gynecology

## 2018-10-04 ENCOUNTER — Other Ambulatory Visit: Payer: Self-pay | Admitting: Oncology

## 2018-10-08 ENCOUNTER — Telehealth: Payer: Self-pay | Admitting: Oncology

## 2018-10-08 NOTE — Telephone Encounter (Signed)
Rescheduled 5/18 to 5/22 and changed to webex. Called and left msg for patient.

## 2018-10-18 ENCOUNTER — Other Ambulatory Visit: Payer: 59

## 2018-10-18 ENCOUNTER — Ambulatory Visit: Payer: 59 | Admitting: Oncology

## 2018-10-20 ENCOUNTER — Telehealth: Payer: Self-pay | Admitting: Oncology

## 2018-10-20 NOTE — Telephone Encounter (Signed)
Called patient regarding [facility] Webex appointment, left patient a voicemail and e-mail has been sent.

## 2018-10-21 ENCOUNTER — Telehealth: Payer: Self-pay | Admitting: Oncology

## 2018-10-21 NOTE — Telephone Encounter (Signed)
Contacted pt regarding webex appt for pre reg.

## 2018-10-21 NOTE — Progress Notes (Signed)
[place]  Telephone:([phone] Fax:([phone]) [phone]   ID: Ashley Tyler DOB: 1966/05/24  MR#: [id number]  OZY#:[id number]  Patient Care Team: Lavone Orn, MD as PCP - General (Internal Medicine) Jhamal Plucinski, Virgie Dad, MD as Consulting Physician (Oncology) [name], MD as Consulting Physician (General Surgery) [name], MD as Attending Physician (Radiation Oncology) Philemon Kingdom, MD as Consulting Physician (Internal Medicine) Larey Dresser, MD as Consulting Physician (Cardiology) PCP: Lavone Orn, MD GYN: OTHER MD:  CHIEF COMPLAINT: Estrogen receptor positive breast cancer  CURRENT TREATMENT: Tamoxifen  I connected with Ashley Tyler on 10/22/18 at 11:30 AM EDT by video enabled telemedicine visit and verified that I am speaking with the correct person using two identifiers.   I discussed the limitations, risks, security and privacy concerns of performing an evaluation and management service by telemedicine and the availability of in-person appointments. I also discussed with the patient that there may be a patient responsible charge related to this service. The patient expressed understanding and agreed to proceed.   Other persons participating in the visit and their role in the encounter: none   Patients location: Home Providers location: Clinic    BREAST CANCER HISTORY: From the original intake note:  Ashley Tyler had screening mammography at Dr. Caralee Ates office 07/24/2015 suggesting a left breast mass and the patient was referred to the [facility] where on 07/30/2015 she underwent left diagnostic mammography with tomosynthesis and left breast ultrasonography. The breast density was category C. In the upper outer quadrant of the left breast there was a 2.3 cm spiculated mass. There were no associated calcifications. This mass was palpable at the 1:00 position 5 cm from the nipple. Ultrasound confirmed an irregular  hypoechoic mass measuring 1.8 cm. Ultrasonography of the left axilla was negative.  On 08/03/2015 the patient underwent biopsy of the left breast mass in question. 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  The patient then met with surgery and after appropriate discussion she proceeded to left lumpectomy and sentinel lymph node sampling 08/28/2015. The final pathology ([id number]) showed invasive ductal carcinoma, grade 3, measuring 2.1 cm. Margins were close but negative. All 3 sentinel lymph nodes were clear. Repeat HER-2 was again negative, with a signals ratio of 0.9, and number per cell 2.20.  INTERVAL HISTORY: Ashley Tyler was seen today for follow-up of her breast cancer. She continues on tamoxifen, with fair tolerance.  She does have significant hot flashes.  She does not have vaginal wetness.  She tells me that there were some changes in her Pap smear which they felt possibly could be due to tamoxifen.  She obtains the drug at a good price.  Since her last visit to the office, she underwent a bilateral diagnostic mammogram on 07/29/2018 at [facility] that showed: breast density category B. No mammographic evidence of malignancy involving either breast. Expected post lumpectomy changes involving the LEFT breast.    REVIEW OF SYSTEMS: Ashley Tyler is not exercising regularly.  Occasionally she walks perhaps half a mile in her neighborhood.  She denies unusual headaches visual changes cough phlegm production pleurisy or shortness of breath change in bowel or bladder habits or any significant fever weight loss unusual fatigue or bleeding problems.  A detailed review of systems today was otherwise stable.  PAST MEDICAL HISTORY:  Past Medical History:  Diagnosis  Date   Cancer [facility])    left breast   Chronic constipation    Endometriosis    Family history of breast cancer    Family history of prostate cancer    Headache    History of radiation therapy 01/21/16- 03/03/16   Left breast/ 50 Gy in 25 fractions. Left Breast Boost/ 10 Gy in 5 Fractions.   Migraines    Personal history of chemotherapy    Personal history of radiation therapy     PAST SURGICAL HISTORY: Past Surgical History:  Procedure Laterality Date   BREAST LUMPECTOMY Left 08/2015   BREAST LUMPECTOMY WITH RADIOACTIVE SEED AND SENTINEL LYMPH NODE BIOPSY Left 08/28/2015   Procedure: LEFT BREAST LUMPECTOMY WITH RADIOACTIVE SEED AND SENTINEL LYMPH NODE BIOPSY;  Surgeon: [name], MD;  Location: [facility];  Service: General;  Laterality: Left;   CESAREAN SECTION     EXCISION / BIOPSY BREAST / NIPPLE / DUCT Left 08/28/2015   LAPAROSCOPIC SALPINGO OOPHERECTOMY Right    TONSILLECTOMY      FAMILY HISTORY Family History  Problem Relation Age of Onset   Prostate cancer Father 37       Gleason = 7   Prostate cancer Maternal Uncle        dx in his 32s   Breast cancer Paternal Aunt        dx in her 71s   Prostate cancer Paternal Uncle    Alzheimer's disease Maternal Grandmother    Lung cancer Maternal Grandfather    Congestive Heart Failure Paternal Grandmother    Prostate cancer Maternal Uncle        dx in his 45s   Breast cancer Paternal Aunt        dx in her 48s; maternal half paternal aunt   Prostate cancer Paternal Uncle        dx in his 85s   Breast cancer Cousin    Breast cancer Cousin        father's maternal cousin's daughter; dx in her 66s-30s  The patient's parents are still living, in their early 87s as of April 2016. The patient is an only child. On her father's side there are 4 cases of breast cancer, 2 aunts diagnosed in their 24s and 69s, and 2 cousins diagnosed in her 78s and one at age 29. The patient's father  had prostate cancer diagnosed age 67.  GYNECOLOGIC HISTORY:  No LMP recorded. Patient is perimenopausal. Menarche age 28, first live birth age 58. The patient is GX P2, although both children were born early. She is status post right salpingo-oophorectomy due to endometriosis. She is still having regular periods  SOCIAL HISTORY: (Updated May 2020). 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  Currently she is working out of her home.  She is divorced. At home is just she and her daughter Ashley Tyler, 16, who is working at [employer]. Her other daughter, Ashley Tyler age 6, majored in criminal justice at [employer].  Currently she is working for [employer].  The patient's significant other is Ashley Tyler   ADVANCED DIRECTIVES: Not in place   HEALTH MAINTENANCE: Social History   Tobacco Use   Smoking status: Never Smoker   Smokeless tobacco: Never Used  Substance Use Topics   Alcohol use: Yes    Comment: social   Drug use: No     Colonoscopy:  PAP:  Bone density:  Lipid panel:  Allergies  Allergen Reactions   Tetracyclines & Related Itching   Other Rash    Powder in gloves causes rash    Current Outpatient Medications  Medication Sig Dispense Refill   tamoxifen (NOLVADEX) 20 MG tablet      tamoxifen (NOLVADEX) 20 MG tablet TAKE 1 TABLET BY MOUTH EVERY DAY 30 tablet 2   No current facility-administered medications for this visit.     OBJECTIVE: Middle-aged African-American woman no acute distress age There were no vitals filed for this visit.   There is no height or weight on file to calculate BMI.    ECOG FS:1 - Symptomatic but completely ambulatory    LAB RESULTS:  CMP     Component Value Date/Time   NA 140 10/14/2017 1435   NA 143 04/14/2017 1413   K 4.2 10/14/2017 1435   K 3.6 04/14/2017 1413   CL 106 10/14/2017 1435   CO2 27 10/14/2017 1435   CO2 29 04/14/2017 1413   GLUCOSE 95 10/14/2017 1435   GLUCOSE  100 04/14/2017 1413   BUN 20 10/14/2017 1435   BUN 12.7 04/14/2017 1413   CREATININE 1.05 10/14/2017 1435   CREATININE 0.9 04/14/2017 1413   CALCIUM 8.9 10/14/2017 1435   CALCIUM 9.0 04/14/2017 1413   PROT 6.9 10/14/2017 1435   PROT 6.6 04/14/2017 1413   ALBUMIN 3.8 10/14/2017 1435   ALBUMIN 3.5 04/14/2017 1413   AST 19 10/14/2017 1435   AST 17 04/14/2017 1413   ALT 18 10/14/2017 1435   ALT 17 04/14/2017 1413   ALKPHOS 72 10/14/2017 1435   ALKPHOS 68 04/14/2017 1413   BILITOT <0.2 (L) 10/14/2017 1435   BILITOT 0.22 04/14/2017 1413   GFRNONAA 60 (L) 10/14/2017 1435   GFRAA >60 10/14/2017 1435    INo results found for: SPEP, UPEP  Lab Results  Component Value Date   WBC 6.4 10/14/2017   NEUTROABS 3.5 10/14/2017   HGB 12.8 10/14/2017   HCT 39.6 10/14/2017   MCV 85.4 10/14/2017   PLT 234 10/14/2017      Chemistry      Component Value Date/Time   NA 140 10/14/2017 1435   NA 143 04/14/2017 1413   K 4.2 10/14/2017 1435   K 3.6 04/14/2017 1413   CL 106 10/14/2017 1435   CO2 27 10/14/2017 1435   CO2 29 04/14/2017 1413   BUN 20 10/14/2017 1435   BUN 12.7 04/14/2017 1413   CREATININE 1.05 10/14/2017 1435   CREATININE 0.9 04/14/2017 1413      Component Value Date/Time   CALCIUM 8.9 10/14/2017 1435   CALCIUM 9.0 04/14/2017 1413   ALKPHOS 72 10/14/2017 1435   ALKPHOS 68 04/14/2017 1413   AST 19 10/14/2017 1435   AST 17 04/14/2017 1413   ALT 18 10/14/2017 1435   ALT 17 04/14/2017 1413   BILITOT <0.2 (L) 10/14/2017 1435   BILITOT 0.22 04/14/2017 1413       No results found for: LABCA2  No components found for: LABCA125  No results for input(s): INR in the last 168 hours.  Urinalysis    Component Value Date/Time   COLORURINE YELLOW 04/07/2016 2007   APPEARANCEUR CLOUDY (A) 04/07/2016 2007   LABSPEC 1.028 04/07/2016 2007   LABSPEC 1.015 10/17/2015 1102   PHURINE 8.5 (H) 04/07/2016 2007   GLUCOSEU NEGATIVE 04/07/2016 2007   GLUCOSEU Negative 10/17/2015  [place] NEGATIVE 04/07/2016 2007   BILIRUBINUR NEGATIVE 04/07/2016 2007   BILIRUBINUR Negative 10/17/2015 1102   KETONESUR  40 (A) 04/07/2016 2007   PROTEINUR 30 (A) 04/07/2016 2007   UROBILINOGEN 0.2 10/17/2015 1102   NITRITE NEGATIVE 04/07/2016 2007   LEUKOCYTESUR TRACE (A) 04/07/2016 2007   LEUKOCYTESUR Negative 10/17/2015 1102      ELIGIBLE FOR AVAILABLE RESEARCH PROTOCOL: no  STUDIES: Since her last visit, she underwent diagnostic bilateral mammography with CAD and tomography on 07/29/2018 at [facility] showing: breast density category B. There was no evidence of malignancy. Expected post lumpectomy changes involving the LEFT breast.   ASSESSMENT: 53 y.o. [place] woman status post left breast upper outer quadrant biopsy 08/03/2015 for a clinical T2 N0, stage IIa invasive ductal carcinoma, grade 3, estrogen and progesterone receptor positive, HER-2 not amplified, with an MIB-1 of 70%  (1) status post left lumpectomy and sentinel lymph node sampling 08/28/2015 for a pT2 pN0, stage IIA invasive ductal carcinoma, grade 3, with close but negative margins. Repeat HER-2 was again negative  (a) oncotype DX Score of 22 predicts a risk of recurrence outside the breast within 10 years of 14% if the patient's only systemic therapy is tamoxifen for 5 years. 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  This is very favorable.  She is tolerating tamoxifen generally well.  She does have hot flashes, but "she can live with those".  We have previously tried venlafaxine and gabapentin but they actually caused her more problems so she does not want to go back to those.  I am not sure that the tamoxifen caused the Pap smear changes, but I did offer if she wished to switch to anastrozole.  We talked about the possible toxicities side effects and complications of that agent.  After the discussion she wants to stay on tamoxifen and just completed until she gets to her 5 years which will be on December 2022  She will have her next mammogram in February 2021 and I will see her a month after that  She knows to call for any other issues that may develop before that visit.   Jelani Trueba, Virgie Dad, MD  10/22/18 11:45 AM Medical Oncology and Hematology [facility] [address] [place], [place] [address] Tel. [phone]    Fax. [phone]    I, Soijett Blue am acting as scribe for Dr. Sarajane Jews C. Makinley Muscato.  I, Lurline Del MD, have reviewed the above documentation for accuracy and completeness, and I agree with the above.

## 2018-10-22 ENCOUNTER — Inpatient Hospital Stay: Payer: 59 | Attending: Oncology | Admitting: Oncology

## 2018-10-22 DIAGNOSIS — Z170 Estrogen receptor positive status [ER+]: Secondary | ICD-10-CM

## 2018-10-22 DIAGNOSIS — Z9221 Personal history of antineoplastic chemotherapy: Secondary | ICD-10-CM | POA: Diagnosis not present

## 2018-10-22 DIAGNOSIS — Z923 Personal history of irradiation: Secondary | ICD-10-CM

## 2018-10-22 DIAGNOSIS — C50412 Malignant neoplasm of upper-outer quadrant of left female breast: Secondary | ICD-10-CM

## 2018-10-22 DIAGNOSIS — Z79810 Long term (current) use of selective estrogen receptor modulators (SERMs): Secondary | ICD-10-CM

## 2018-10-22 DIAGNOSIS — Z8042 Family history of malignant neoplasm of prostate: Secondary | ICD-10-CM

## 2018-10-22 DIAGNOSIS — Z803 Family history of malignant neoplasm of breast: Secondary | ICD-10-CM

## 2018-10-22 MED ORDER — TAMOXIFEN CITRATE 20 MG PO TABS: 20.0000 mg | ORAL_TABLET | Freq: Every day | ORAL | 4 refills | Status: DC

## 2019-06-04 ENCOUNTER — Other Ambulatory Visit: Payer: Self-pay | Admitting: Oncology

## 2019-06-06 ENCOUNTER — Other Ambulatory Visit: Payer: Self-pay | Admitting: *Deleted

## 2019-06-09 ENCOUNTER — Other Ambulatory Visit: Payer: Self-pay | Admitting: Oncology

## 2019-06-09 DIAGNOSIS — Z853 Personal history of malignant neoplasm of breast: Secondary | ICD-10-CM

## 2019-08-01 ENCOUNTER — Other Ambulatory Visit: Payer: Self-pay

## 2019-08-01 ENCOUNTER — Ambulatory Visit
Admission: RE | Admit: 2019-08-01 | Discharge: 2019-08-01 | Disposition: A | Discharge status: Home / Self Care | Payer: 59 | Source: Ambulatory Visit | Attending: Oncology | Admitting: Oncology

## 2019-08-01 DIAGNOSIS — Z853 Personal history of malignant neoplasm of breast: Secondary | ICD-10-CM

## 2019-09-23 ENCOUNTER — Other Ambulatory Visit: Payer: Self-pay | Admitting: *Deleted

## 2019-09-23 DIAGNOSIS — Z170 Estrogen receptor positive status [ER+]: Secondary | ICD-10-CM

## 2019-09-23 DIAGNOSIS — C50412 Malignant neoplasm of upper-outer quadrant of left female breast: Secondary | ICD-10-CM

## 2019-09-25 NOTE — Progress Notes (Signed)
[facility]  [phone] [phone]   ID: Ashley Tyler DOB: 01/12/1966  MR#: [id number]  OJJ#:[id number]  Patient Care Team: Lavone Orn, MD as PCP - General (Internal Medicine) Kenita Bines, Virgie Dad, MD as Consulting Physician (Oncology) [name], MD (Inactive) as Consulting Physician (General Surgery) [name], MD as Attending Physician (Radiation Oncology) Philemon Kingdom, MD as Consulting Physician (Internal Medicine) Larey Dresser, MD as Consulting Physician (Cardiology) OTHER MD:  CHIEF COMPLAINT: Estrogen receptor positive breast cancer  CURRENT TREATMENT: Tamoxifen   INTERVAL HISTORY: Ashley Tyler was seen today for follow-up of her breast cancer.   She continues on tamoxifen.  She has multiple symptoms which can be due to or at least aggravated by that drug.  She sleeps very poorly, up 3 times a night because of hot flashes.  This makes her very fatigued.  She spends the morning in bed and then works from noon to 9 PM at home.  She was unable to tolerate gabapentin and venlafaxine.  A friend has suggested that she try Paxil.  Since her last visit to the office, she underwent a bilateral diagnostic mammogram on 08/01/2019 at [facility] that showed: breast density category B; no mammographic evidence of malignancy involving either breast; benign-appearing area of skin discoloration in upper-inner aspect of each breast.   REVIEW OF SYSTEMS: Ashley Tyler had both her Covid vaccines and so did her daughter at home.  Ashley Tyler had some headaches associated with the vaccine but otherwise tolerated it well.  She is not exercising regularly at present.  Detailed review of systems was otherwise negative except as noted above.   BREAST CANCER HISTORY: From the original intake note:  Ashley Tyler had screening mammography at Dr. Caralee Ates office 07/24/2015 suggesting a left breast mass and the patient was referred to the [facility]  where on 07/30/2015 she underwent left diagnostic mammography with tomosynthesis and left breast ultrasonography. The breast density was category C. In the upper outer quadrant of the left breast there was a 2.3 cm spiculated mass. There were no associated calcifications. This mass was palpable at the 1:00 position 5 cm from the nipple. Ultrasound confirmed an irregular hypoechoic mass measuring 1.8 cm. Ultrasonography of the left axilla was negative.  On 08/03/2015 the patient underwent biopsy of the left breast mass in question. 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  The patient then met with surgery and after appropriate discussion she proceeded to left lumpectomy and sentinel lymph node sampling 08/28/2015. The final pathology ([id number]) showed invasive ductal carcinoma, grade 3, measuring 2.1 cm. Margins were close but negative. All 3 sentinel lymph nodes were clear. Repeat HER-2 was again negative, with a signals ratio of 0.9, and number per cell 2.20.   PAST MEDICAL HISTORY: Past Medical History:  Diagnosis Date  . Cancer ([place])    left breast  . Chronic constipation   . Endometriosis   . Family history of breast cancer   . Family history of prostate cancer   . Headache   . History of radiation therapy 01/21/16- 03/03/16   Left breast/ 50 Gy in 25 fractions. Left Breast Boost/ 10 Gy in 5 Fractions.  . Migraines   . Personal history of chemotherapy   . Personal history of radiation therapy  PAST SURGICAL HISTORY: Past Surgical History:  Procedure Laterality Date  . BREAST LUMPECTOMY Left 08/2015  . BREAST LUMPECTOMY WITH RADIOACTIVE SEED AND SENTINEL LYMPH NODE BIOPSY Left 08/28/2015   Procedure: LEFT BREAST LUMPECTOMY WITH RADIOACTIVE  SEED AND SENTINEL LYMPH NODE BIOPSY;  Surgeon: [name], MD;  Location: [facility];  Service: General;  Laterality: Left;  . CESAREAN SECTION    . EXCISION / BIOPSY BREAST / NIPPLE / DUCT Left 08/28/2015  . LAPAROSCOPIC SALPINGO OOPHERECTOMY Right   . TONSILLECTOMY      FAMILY HISTORY Family History  Problem Relation Age of Onset  . Prostate cancer Father 63       Gleason = 7  . Prostate cancer Maternal Uncle        dx in his 75s  . Breast cancer Paternal Aunt        dx in her 86s  . Prostate cancer Paternal Uncle   . Alzheimer's disease Maternal Grandmother   . Lung cancer Maternal Grandfather   . Congestive Heart Failure Paternal Grandmother   . Prostate cancer Maternal Uncle        dx in his 70s  . Breast cancer Paternal Aunt        dx in her 50s; maternal half paternal aunt  . Prostate cancer Paternal Uncle        dx in his 45s  . Breast cancer Cousin   . Breast cancer Cousin        father's maternal cousin's daughter; dx in her 65s-30s  The patient's parents are still living, in their early 39s as of April 2016. The patient is an only child. On her father's side there are 4 cases of breast cancer, 2 aunts diagnosed in their 39s and 66s, and 2 cousins diagnosed in her 7s and one at age 76. The patient's father had prostate cancer diagnosed age 63.   GYNECOLOGIC HISTORY:  No LMP recorded. Patient is perimenopausal. Menarche age 30, first live birth age 59. The patient is GX P2, although both children were born early. She is status post right salpingo-oophorectomy due to endometriosis. 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  She is divorced. At home is just she and her daughter Ashley Tyler, who will turn 18 December 2019, is working at [employer], and plans to go to [employer] in the fall. Her other daughter,  Ashley Tyler age 58, majored in criminal justice at [employer] but currently is not working.      ADVANCED DIRECTIVES: Not in place   HEALTH MAINTENANCE: Social History   Tobacco Use  . Smoking status: Never Smoker  . Smokeless tobacco: Never Used  Substance Use Topics  . Alcohol use: Yes    Comment: social  . Drug use: No     Colonoscopy:  PAP:  Bone density:  Lipid panel:  Allergies  Allergen Reactions  . Tetracyclines & Related Itching  . Other Rash    Powder in gloves causes rash    Current Outpatient Medications  Medication Sig Dispense Refill  . tamoxifen (NOLVADEX) 20 MG tablet     . tamoxifen (NOLVADEX) 20 MG tablet Take 1 tablet (20 mg total) by mouth daily. 90 tablet 4  . tamoxifen (NOLVADEX) 20 MG tablet TAKE 1 TABLET BY MOUTH  DAILY 90 tablet 3   No current facility-administered medications for this visit.  OBJECTIVE:  African-American woman who appears younger than stated age 10:   09/26/19 1457  BP: 128/85  Pulse: 79  Resp: 18  Temp: 98.3 F (36.8 C)  SpO2: 100%     Body mass index is 30.5 kg/m.    ECOG FS:1 - Symptomatic but completely ambulatory  Sclerae unicteric, EOMs intact Wearing a mask No cervical or supraclavicular adenopathy Lungs no rales or rhonchi Heart regular rate and rhythm Abd soft, nontender, positive bowel sounds MSK no focal spinal tenderness, no upper extremity lymphedema Neuro: nonfocal, well oriented, appropriate affect Breasts: The right breast is unremarkable.  The left breast is status post lumpectomy and radiation.  There is no evidence of local recurrence.  Both axillae are benign Skin: There is a patch of darker skin in the upper portion of the left breast.  I would have taken this to be the site of her radiation "boost" but she has a smaller patch in the contralateral breast, more or less symmetrically.  Possibly this is due to her bra being a little tight.   LAB RESULTS:  CMP     Component Value  Date/Time   NA 143 09/26/2019 1415   NA 143 04/14/2017 1413   K 4.5 09/26/2019 1415   K 3.6 04/14/2017 1413   CL 107 09/26/2019 1415   CO2 27 09/26/2019 1415   CO2 29 04/14/2017 1413   GLUCOSE 94 09/26/2019 1415   GLUCOSE 100 04/14/2017 1413   BUN 13 09/26/2019 1415   BUN 12.7 04/14/2017 1413   CREATININE 1.08 (H) 09/26/2019 1415   CREATININE 0.9 04/14/2017 1413   CALCIUM 9.3 09/26/2019 1415   CALCIUM 9.0 04/14/2017 1413   PROT 7.2 09/26/2019 1415   PROT 6.6 04/14/2017 1413   ALBUMIN 3.9 09/26/2019 1415   ALBUMIN 3.5 04/14/2017 1413   AST 19 09/26/2019 1415   AST 17 04/14/2017 1413   ALT 13 09/26/2019 1415   ALT 17 04/14/2017 1413   ALKPHOS 71 09/26/2019 1415   ALKPHOS 68 04/14/2017 1413   BILITOT 0.4 09/26/2019 1415   BILITOT 0.22 04/14/2017 1413   GFRNONAA 58 (L) 09/26/2019 1415   GFRAA >60 09/26/2019 1415    INo results found for: SPEP, UPEP  Lab Results  Component Value Date   WBC 6.5 09/26/2019   NEUTROABS 3.0 09/26/2019   HGB 13.1 09/26/2019   HCT 42.1 09/26/2019   MCV 87.9 09/26/2019   PLT 258 09/26/2019      Chemistry      Component Value Date/Time   NA 143 09/26/2019 1415   NA 143 04/14/2017 1413   K 4.5 09/26/2019 1415   K 3.6 04/14/2017 1413   CL 107 09/26/2019 1415   CO2 27 09/26/2019 1415   CO2 29 04/14/2017 1413   BUN 13 09/26/2019 1415   BUN 12.7 04/14/2017 1413   CREATININE 1.08 (H) 09/26/2019 1415   CREATININE 0.9 04/14/2017 1413      Component Value Date/Time   CALCIUM 9.3 09/26/2019 1415   CALCIUM 9.0 04/14/2017 1413   ALKPHOS 71 09/26/2019 1415   ALKPHOS 68 04/14/2017 1413   AST 19 09/26/2019 1415   AST 17 04/14/2017 1413   ALT 13 09/26/2019 1415   ALT 17 04/14/2017 1413   BILITOT 0.4 09/26/2019 1415   BILITOT 0.22 04/14/2017 1413       No results found for: LABCA2  No components found for: LABCA125  No results for input(s): INR in the last 168 hours.  Urinalysis  Component Value Date/Time   COLORURINE YELLOW  04/07/2016 2007   APPEARANCEUR CLOUDY (A) 04/07/2016 2007   LABSPEC 1.028 04/07/2016 2007   LABSPEC 1.015 10/17/2015 1102   PHURINE 8.5 (H) 04/07/2016 2007   GLUCOSEU NEGATIVE 04/07/2016 2007   GLUCOSEU Negative 10/17/2015 1102   HGBUR NEGATIVE 04/07/2016 2007   BILIRUBINUR NEGATIVE 04/07/2016 2007   BILIRUBINUR Negative 10/17/2015 1102   KETONESUR 40 (A) 04/07/2016 2007   PROTEINUR 30 (A) 04/07/2016 2007   UROBILINOGEN 0.2 10/17/2015 1102   NITRITE NEGATIVE 04/07/2016 2007   LEUKOCYTESUR TRACE (A) 04/07/2016 2007   LEUKOCYTESUR Negative 10/17/2015 1102    ELIGIBLE FOR AVAILABLE RESEARCH PROTOCOL: no  STUDIES: No results found.   ASSESSMENT: 54 y.o. [place] woman status post left breast upper outer quadrant biopsy 08/03/2015 for a clinical T2 N0, stage IIa invasive ductal carcinoma, grade 3, estrogen and progesterone receptor positive, HER-2 not amplified, with an MIB-1 of 70%  (1) status post left lumpectomy and sentinel lymph node sampling 08/28/2015 for a pT2 pN0, stage IIA invasive ductal carcinoma, grade 3, with close but negative margins. Repeat HER-2 was again negative  (a) oncotype DX Score of 22 predicts a risk of recurrence outside the breast within 10 years of 14% if the patient's only systemic therapy is tamoxifen for 5 years. 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  Certainly tamoxifen could be aggravating these.  On the other hand some of my patients go off tamoxifen and have a pretty much the same symptoms anyway which were really mostly do to menopause.  She has tried gabapentin and venlafaxine and they either did not work or she did not tolerate them well.  A friend has suggested Paxil for her.  We could try that.  We would then increase the dose of tamoxifen because of the interaction regarding and doxepin.  However Ashley Tyler would prefer not to mess with the tamoxifen dose so we are not going in that direction.  We discussed the TTS 1 patch.  This can be helpful in terms of hot flashes as well as of course good for blood pressure control.  She is aware of the possible toxicities side effects and complications particularly the development of an allergy to the glue in some patients.  She would like to give this a try and I have gone ahead and written the prescription  I also suggested she start vitamin D supplementation since she has some osteopenia.  I wrote her a prescription for bras and hope she will be able to find 1 that fits her well and is comfortable.  Otherwise she will return to see me in 1 year and she will finish tamoxifen November of next year  She knows to  call for any other issue that may develop before that visit.  Total encounter time 25 minutes.*  Treyshaun Keatts, Virgie Dad, MD  09/26/19 3:08 PM Medical Oncology and Hematology [facility] [place], [place] [address] Tel. [phone]    Fax. [phone]    I, Wilburn Mylar, am acting as scribe for Dr. Virgie Dad. Jonty Morrical.  I, Lurline Del MD, have reviewed the above documentation for accuracy and completeness,  and I agree with the above.   *Total Encounter Time as defined by the Centers for Medicare and Medicaid Services includes, in addition to the face-to-face time of a patient visit (documented in the note above) non-face-to-face time: obtaining and reviewing outside history, ordering and reviewing medications, tests or procedures, care coordination (communications with other health care professionals or caregivers) and documentation in the medical record.

## 2019-09-26 ENCOUNTER — Other Ambulatory Visit: Payer: Self-pay

## 2019-09-26 ENCOUNTER — Inpatient Hospital Stay (HOSPITAL_BASED_OUTPATIENT_CLINIC_OR_DEPARTMENT_OTHER): Payer: 59 | Admitting: Oncology

## 2019-09-26 ENCOUNTER — Inpatient Hospital Stay: Payer: 59 | Attending: Oncology

## 2019-09-26 VITALS — BP 128/85 | HR 79 | Temp 98.3°F | Resp 18 | Ht 65.5 in | Wt 186.1 lb

## 2019-09-26 DIAGNOSIS — C50412 Malignant neoplasm of upper-outer quadrant of left female breast: Secondary | ICD-10-CM | POA: Diagnosis not present

## 2019-09-26 DIAGNOSIS — Z170 Estrogen receptor positive status [ER+]: Secondary | ICD-10-CM | POA: Diagnosis not present

## 2019-09-26 DIAGNOSIS — Z9221 Personal history of antineoplastic chemotherapy: Secondary | ICD-10-CM | POA: Insufficient documentation

## 2019-09-26 DIAGNOSIS — Z8042 Family history of malignant neoplasm of prostate: Secondary | ICD-10-CM | POA: Diagnosis not present

## 2019-09-26 DIAGNOSIS — M5137 Other intervertebral disc degeneration, lumbosacral region: Secondary | ICD-10-CM

## 2019-09-26 DIAGNOSIS — Z801 Family history of malignant neoplasm of trachea, bronchus and lung: Secondary | ICD-10-CM | POA: Insufficient documentation

## 2019-09-26 DIAGNOSIS — Z8249 Family history of ischemic heart disease and other diseases of the circulatory system: Secondary | ICD-10-CM | POA: Insufficient documentation

## 2019-09-26 DIAGNOSIS — Z79810 Long term (current) use of selective estrogen receptor modulators (SERMs): Secondary | ICD-10-CM | POA: Insufficient documentation

## 2019-09-26 DIAGNOSIS — M8580 Other specified disorders of bone density and structure, unspecified site: Secondary | ICD-10-CM | POA: Diagnosis not present

## 2019-09-26 DIAGNOSIS — Z803 Family history of malignant neoplasm of breast: Secondary | ICD-10-CM | POA: Diagnosis not present

## 2019-09-26 DIAGNOSIS — Z923 Personal history of irradiation: Secondary | ICD-10-CM | POA: Diagnosis not present

## 2019-09-26 DIAGNOSIS — E063 Autoimmune thyroiditis: Secondary | ICD-10-CM | POA: Diagnosis not present

## 2019-09-26 LAB — CBC WITH DIFFERENTIAL (CANCER CENTER ONLY)
Abs Immature Granulocytes: 0.01 10*3/uL (ref 0.00–0.07)
Basophils Absolute: 0 10*3/uL (ref 0.0–0.1)
Basophils Relative: 1 %
Eosinophils Absolute: 0.1 10*3/uL (ref 0.0–0.5)
Eosinophils Relative: 1 %
HCT: 42.1 % (ref 36.0–46.0)
Hemoglobin: 13.1 g/dL (ref 12.0–15.0)
Immature Granulocytes: 0 %
Lymphocytes Relative: 46 %
Lymphs Abs: 3 10*3/uL (ref 0.7–4.0)
MCH: 27.3 pg (ref 26.0–34.0)
MCHC: 31.1 g/dL (ref 30.0–36.0)
MCV: 87.9 fL (ref 80.0–100.0)
Monocytes Absolute: 0.3 10*3/uL (ref 0.1–1.0)
Monocytes Relative: 5 %
Neutro Abs: 3 10*3/uL (ref 1.7–7.7)
Neutrophils Relative %: 47 %
Platelet Count: 258 10*3/uL (ref 150–400)
RBC: 4.79 MIL/uL (ref 3.87–5.11)
RDW: 14.1 % (ref 11.5–15.5)
WBC Count: 6.5 10*3/uL (ref 4.0–10.5)
nRBC: 0 % (ref 0.0–0.2)

## 2019-09-26 LAB — CMP (CANCER CENTER ONLY)
ALT: 13 U/L (ref 0–44)
AST: 19 U/L (ref 15–41)
Albumin: 3.9 g/dL (ref 3.5–5.0)
Alkaline Phosphatase: 71 U/L (ref 38–126)
Anion gap: 9 (ref 5–15)
BUN: 13 mg/dL (ref 6–20)
CO2: 27 mmol/L (ref 22–32)
Calcium: 9.3 mg/dL (ref 8.9–10.3)
Chloride: 107 mmol/L (ref 98–111)
Creatinine: 1.08 mg/dL — ABNORMAL HIGH (ref 0.44–1.00)
GFR, Est AFR Am: >60 mL/min (ref >60)
GFR, Estimated: 58 mL/min — ABNORMAL LOW (ref >60)
Glucose, Bld: 94 mg/dL (ref 70–99)
Potassium: 4.5 mmol/L (ref 3.5–5.1)
Sodium: 143 mmol/L (ref 135–145)
Total Bilirubin: 0.4 mg/dL (ref 0.3–1.2)
Total Protein: 7.2 g/dL (ref 6.5–8.1)

## 2019-09-26 MED ORDER — TAMOXIFEN CITRATE 20 MG PO TABS: 20.0000 mg | ORAL_TABLET | Freq: Every day | ORAL | 4 refills | Status: DC

## 2019-09-26 MED ORDER — VITAMIN D 25 MCG (1000 UNIT) PO TABS: 1000.0000 [IU] | ORAL_TABLET | Freq: Every day | ORAL | 4 refills | Status: DC

## 2019-09-26 MED ORDER — CALTRATE 600+D PLUS MINERALS 600-800 MG-UNIT PO TABS: 1.0000 | ORAL_TABLET | Freq: Every day | ORAL | 4 refills | Status: DC

## 2019-09-26 MED ORDER — CLONIDINE 0.1 MG/24HR TD PTWK: 0.1000 mg | MEDICATED_PATCH | TRANSDERMAL | 12 refills | Status: DC

## 2019-09-27 ENCOUNTER — Telehealth: Payer: Self-pay | Admitting: Oncology

## 2019-09-27 NOTE — Telephone Encounter (Signed)
Scheduled appts per 4/26 los. Pt confirmed appt date and time.

## 2020-04-25 ENCOUNTER — Other Ambulatory Visit: Payer: Self-pay | Admitting: Oncology

## 2020-06-06 ENCOUNTER — Other Ambulatory Visit: Payer: Self-pay | Admitting: Oncology

## 2020-06-06 DIAGNOSIS — Z9889 Other specified postprocedural states: Secondary | ICD-10-CM

## 2020-08-24 ENCOUNTER — Ambulatory Visit
Admission: RE | Admit: 2020-08-24 | Discharge: 2020-08-24 | Disposition: A | Discharge status: Home / Self Care | Payer: 59 | Source: Ambulatory Visit | Attending: Oncology | Admitting: Oncology

## 2020-08-24 ENCOUNTER — Other Ambulatory Visit: Payer: Self-pay

## 2020-08-24 DIAGNOSIS — Z9889 Other specified postprocedural states: Secondary | ICD-10-CM

## 2020-09-24 NOTE — Progress Notes (Signed)
[facility]  [phone] [phone]   ID: Ashley Tyler DOB: [birth year]  MR#: [id number]  NXG#:[id number]  Patient Care Team: Ashley Orn, MD as PCP - General (Internal Medicine) [facility] Tyler, Ashley Dad, MD as Consulting Physician (Oncology) Ashley Seltzer, MD (Inactive) as Consulting Physician (General Surgery) Ashley Gibson, MD as Attending Physician (Radiation Oncology) Ashley Kingdom, MD as Consulting Physician (Internal Medicine) Ashley Dresser, MD as Consulting Physician (Cardiology) OTHER MD:  CHIEF COMPLAINT: Estrogen receptor positive breast cancer  CURRENT TREATMENT: Completing 5 years of tamoxifen   INTERVAL HISTORY: Ashley Tyler was seen today for follow-up of her breast cancer.   She continues on tamoxifen.  She has had terrible problems with hot flashes and insomnia which we have not been able to relieve.  She had nausea with gabapentin and Effexor and the TTS 1 patches were moderately helpful but then the price went considerably up and she stopped those.  She is very much looking forward to stopping tamoxifen this November.  Since her last visit to the office, she underwent a bilateral diagnostic mammogram on 08/24/2020 at [facility] that showed: breast density category B; no mammographic evidence of malignancy involving either breast.   REVIEW OF SYSTEMS: Ashley Tyler continues to work mostly from her home.  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  Aside from the symptoms noted above a detailed review of systems today was otherwise stable   COVID 19 VACCINATION STATUS: vaccinated x2 with [facility], followed by booster x1   BREAST CANCER HISTORY: From the original intake note:  Ashley Tyler had screening mammography at Dr. Caralee Tyler office 07/24/2015 suggesting a left breast mass and the patient was referred to the [facility] where on 07/30/2015 she underwent left diagnostic  mammography with tomosynthesis and left breast ultrasonography. The breast density was category C. In the upper outer quadrant of the left breast there was a 2.3 cm spiculated mass. There were no associated calcifications. This mass was palpable at the 1:00 position 5 cm from the nipple. Ultrasound confirmed an irregular hypoechoic mass measuring 1.8 cm. Ultrasonography of the left axilla was negative.  On 08/03/2015 the patient underwent biopsy of the left breast mass in question. 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  The patient then met with surgery and after appropriate discussion she proceeded to left lumpectomy and sentinel lymph node sampling 08/28/2015. The final pathology ([id number]) showed invasive ductal carcinoma, grade 3, measuring 2.1 cm. Margins were close but negative. All 3 sentinel lymph nodes were clear. Repeat HER-2 was again negative, with a signals ratio of 0.9, and number per cell 2.20.   PAST MEDICAL HISTORY: Past Medical History:  Diagnosis Date  . Cancer ([place])    left breast  . Chronic constipation   . Endometriosis   . Family history of breast cancer   . Family history of prostate cancer   . Headache   . History of radiation therapy 01/21/16- 03/03/16   Left breast/ 50 Gy in 25 fractions. Left Breast Boost/ 10 Gy in 5 Fractions.  . Migraines   . Personal history of chemotherapy   . Personal history of radiation therapy     PAST SURGICAL HISTORY: Past Surgical History:  Procedure Laterality Date  . BREAST LUMPECTOMY  Left 08/2015  . BREAST LUMPECTOMY WITH RADIOACTIVE SEED AND SENTINEL LYMPH NODE BIOPSY Left 08/28/2015   Procedure: LEFT BREAST LUMPECTOMY WITH RADIOACTIVE SEED AND SENTINEL LYMPH NODE BIOPSY;  Surgeon:  Ashley Seltzer, MD;  Location: [facility];  Service: General;  Laterality: Left;  . CESAREAN SECTION    . EXCISION / BIOPSY BREAST / NIPPLE / DUCT Left 08/28/2015  . LAPAROSCOPIC SALPINGO OOPHERECTOMY Right   . TONSILLECTOMY      FAMILY HISTORY Family History  Problem Relation Age of Onset  . Prostate cancer Father 54       Gleason = 7  . Prostate cancer Maternal Uncle        dx in his 25s  . Breast cancer Paternal Aunt        dx in her 16s  . Prostate cancer Paternal Uncle   . Alzheimer's disease Maternal Grandmother   . Lung cancer Maternal Grandfather   . Congestive Heart Failure Paternal Grandmother   . Prostate cancer Maternal Uncle        dx in his 66s  . Breast cancer Paternal Aunt        dx in her 63s; maternal half paternal aunt  . Prostate cancer Paternal Uncle        dx in his 33s  . Breast cancer Cousin   . Breast cancer Cousin        father's maternal cousin's daughter; dx in her 42s-30s  The patient's parents are still living, in their early 64s as of April 2016. The patient is an only child. On her father's side there are 4 cases of breast cancer, 2 aunts diagnosed in their 63s and 55s, and 2 cousins diagnosed in her 63s and one at age 71. The patient's father had prostate cancer diagnosed age 44.   GYNECOLOGIC HISTORY:  No LMP recorded. Patient is perimenopausal. Menarche age 69, first live birth age 37. The patient is GX P2, although both children were born early. She is status post right salpingo-oophorectomy due to endometriosis. 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  She is divorced. At home is just she and her daughter Ashley Tyler, who has a degree in criminal justice from [employer] and is currently working in the [employer] in [place].  The patient's second daughter Ashley Tyler, is currently  studying biology at [employer] and hopes to become a Psychiatric nurse.  ADVANCED DIRECTIVES: Not in place; at the 09/25/2020 visit the patient was given the appropriate documents to complete and notarize at her discretion   HEALTH MAINTENANCE: Social History   Tobacco Use  . Smoking status: Never Smoker  . Smokeless tobacco: Never Used  Substance Use Topics  . Alcohol use: Yes    Comment: social  . Drug use: No     Colonoscopy:  PAP:  Bone density:  Lipid panel:  Allergies  Allergen Reactions  . Tetracyclines & Related Itching  . Other Rash    Powder in gloves causes rash    Current Outpatient Medications  Medication Sig Dispense Refill  . Calcium Carbonate-Vit D-Min (CALTRATE 600+D PLUS MINERALS) 600-800 MG-UNIT TABS Take 1 tablet by mouth daily. 90 tablet 4  . cholecalciferol (VITAMIN D3) 25 MCG (1000 UNIT) tablet Take 1 tablet (1,000 Units total) by mouth daily. 90 tablet 4  . cloNIDine (CATAPRES-TTS-1) 0.1 mg/24hr patch Place 1 patch (  0.1 mg total) onto the skin once a week. 4 patch 12  . tamoxifen (NOLVADEX) 20 MG tablet TAKE 1 TABLET BY MOUTH  DAILY 90 tablet 3   No current facility-administered medications for this visit.    OBJECTIVE:  African-American Tyler who appears younger than stated age 55:   09/25/20 0815  BP: 117/83  Pulse: 69  Resp: 18  Temp: (!) 97.4 F (36.3 C)  SpO2: 100%     Body mass index is 29.91 kg/m.    ECOG FS:1 - Symptomatic but completely ambulatory  Sclerae unicteric, EOMs intact Wearing a mask No cervical or supraclavicular adenopathy Lungs no rales or rhonchi Heart regular rate and rhythm Abd soft, nontender, positive bowel sounds MSK no focal spinal tenderness, no upper extremity lymphedema Neuro: nonfocal, well oriented, appropriate affect Breasts: The left breast has undergone lumpectomy and radiation.  There is no evidence of local recurrence.  The cosmetic result is excellent.  The right breast and both axillae  are benign.   LAB RESULTS:  CMP     Component Value Date/Time   NA 143 09/25/2020 0750   NA 143 04/14/2017 1413   K 4.2 09/25/2020 0750   K 3.6 04/14/2017 1413   CL 110 09/25/2020 0750   CO2 24 09/25/2020 0750   CO2 29 04/14/2017 1413   GLUCOSE 118 (H) 09/25/2020 0750   GLUCOSE 100 04/14/2017 1413   BUN 18 09/25/2020 0750   BUN 12.7 04/14/2017 1413   CREATININE 1.08 (H) 09/25/2020 0750   CREATININE 1.08 (H) 09/26/2019 1415   CREATININE 0.9 04/14/2017 1413   CALCIUM 8.4 (L) 09/25/2020 0750   CALCIUM 9.0 04/14/2017 1413   PROT 6.6 09/25/2020 0750   PROT 6.6 04/14/2017 1413   ALBUMIN 3.6 09/25/2020 0750   ALBUMIN 3.5 04/14/2017 1413   AST 19 09/25/2020 0750   AST 19 09/26/2019 1415   AST 17 04/14/2017 1413   ALT 17 09/25/2020 0750   ALT 13 09/26/2019 1415   ALT 17 04/14/2017 1413   ALKPHOS 66 09/25/2020 0750   ALKPHOS 68 04/14/2017 1413   BILITOT 0.2 (L) 09/25/2020 0750   BILITOT 0.4 09/26/2019 1415   BILITOT 0.22 04/14/2017 1413   GFRNONAA >60 09/25/2020 0750   GFRNONAA 58 (L) 09/26/2019 1415   GFRAA >60 09/26/2019 1415    INo results found for: SPEP, UPEP  Lab Results  Component Value Date   WBC 6.9 09/25/2020   NEUTROABS 3.2 09/25/2020   HGB 11.9 (L) 09/25/2020   HCT 38.2 09/25/2020   MCV 87.4 09/25/2020   PLT 258 09/25/2020      Chemistry      Component Value Date/Time   NA 143 09/25/2020 0750   NA 143 04/14/2017 1413   K 4.2 09/25/2020 0750   K 3.6 04/14/2017 1413   CL 110 09/25/2020 0750   CO2 24 09/25/2020 0750   CO2 29 04/14/2017 1413   BUN 18 09/25/2020 0750   BUN 12.7 04/14/2017 1413   CREATININE 1.08 (H) 09/25/2020 0750   CREATININE 1.08 (H) 09/26/2019 1415   CREATININE 0.9 04/14/2017 1413      Component Value Date/Time   CALCIUM 8.4 (L) 09/25/2020 0750   CALCIUM 9.0 04/14/2017 1413   ALKPHOS 66 09/25/2020 0750   ALKPHOS 68 04/14/2017 1413   AST 19 09/25/2020 0750   AST 19 09/26/2019 1415   AST 17 04/14/2017 1413   ALT 17  09/25/2020 0750   ALT 13 09/26/2019 1415   ALT 17 04/14/2017 1413  BILITOT 0.2 (L) 09/25/2020 0750   BILITOT 0.4 09/26/2019 1415   BILITOT 0.22 04/14/2017 1413       No results found for: LABCA2  No components found for: TMHDQ222  No results for input(s): INR in the last 168 hours.  Urinalysis    Component Value Date/Time   COLORURINE YELLOW 04/07/2016 2007   APPEARANCEUR CLOUDY (A) 04/07/2016 2007   LABSPEC 1.028 04/07/2016 2007   LABSPEC 1.015 10/17/2015 1102   PHURINE 8.5 (H) 04/07/2016 2007   GLUCOSEU NEGATIVE 04/07/2016 2007   GLUCOSEU Negative 10/17/2015 1102   HGBUR NEGATIVE 04/07/2016 2007   BILIRUBINUR NEGATIVE 04/07/2016 2007   BILIRUBINUR Negative 10/17/2015 1102   KETONESUR 40 (A) 04/07/2016 2007   PROTEINUR 30 (A) 04/07/2016 2007   UROBILINOGEN 0.2 10/17/2015 1102   NITRITE NEGATIVE 04/07/2016 2007   LEUKOCYTESUR TRACE (A) 04/07/2016 2007   LEUKOCYTESUR Negative 10/17/2015 1102    ELIGIBLE FOR AVAILABLE RESEARCH PROTOCOL: no  STUDIES: No results found.   ASSESSMENT: 55 y.o. Ashley Tyler status post left breast upper outer quadrant biopsy 08/03/2015 for a clinical T2 N0, stage IIa invasive ductal carcinoma, grade 3, estrogen and progesterone receptor positive, HER-2 not amplified, with an MIB-1 of 70%  (1) status post left lumpectomy and sentinel lymph node sampling 08/28/2015 for a pT2 pN0, stage IIA invasive ductal carcinoma, grade 3, with close but negative margins. Repeat HER-2 was again negative  (a) oncotype DX Score of 22 predicts a risk of recurrence outside the breast within 10 years of 14% if the patient's only systemic therapy is tamoxifen for 5 years. The addition of CAF reduced the risk an additional 4 %  (2) adjuvant chemotherapy consisting of cyclophosphamide and  docetaxel started 10/11/2015  (a) changed to cyclophosphamide and doxorubicin for 3 cycles, after first cycle of TC because of elevated LFTs, completed 12/13/2015  (3)  adjuvant radiation 01/21/2016-03/03/2016   (a) possibly radiation associated thyrotoxicosis--resolving  (4) started tamoxifen 05/02/2016, completing five years November 2022  (a) bone density 12/20/2015 shows mild osteopenia, with a T score of -1.5.  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  This is very favorable.  We reviewed her history and she understands with an Oncotype of 22 today she would not have received chemotherapy.  Given her Oncotype results and the fact that she did receive chemo and tamoxifen I would estimate her risk of recurrence at this Tyler to be in the 5% or so range.  This means continuing tamoxifen another 5 years would lower her risk minimally, perhaps 1 or at most 2%.  This is certainly not a motivator for her given all the problems she has had with that drug.  Accordingly she will continue tamoxifen an additional 6 months and stop in November.  I am hopeful by next February, 4 months or so after stopping tamoxifen, she will notice a significant improvement in her hot flashes and insomnia problems  I offered her participation in our survivorship program but at this Tyler she declines.  I also gave her a copy of a blank healthcare power of attorney form for  her to complete at her discretion  At this Tyler I am comfortable releasing her to her primary care physicians care.  All she will need in terms of breast cancer follow-up is her yearly mammography and a yearly physician breast exam  I will be glad to see Saroya at any Tyler in the future if and when the need  arises but as of now are making no further routine appointment for her here.  Total encounter time 25 minutes.*  Diem Dicocco, Ashley Dad, MD  09/25/20 8:40 AM Medical Oncology and Hematology [facility] [place], [place] [address] Tel. [phone]    Fax. [phone]    I, Wilburn Mylar, am acting as scribe for Dr. Virgie Tyler. Adoria Kawamoto.  I, Lurline Del MD, have reviewed the above documentation for accuracy and completeness, and I agree with the above.   *Total Encounter Time as defined by the Centers for Medicare and Medicaid Services includes, in addition to the face-to-face time of a patient visit (documented in the note above) non-face-to-face time: obtaining and reviewing outside history, ordering and reviewing medications, tests or procedures, care coordination (communications with other health care professionals or caregivers) and documentation in the medical record.

## 2020-09-25 ENCOUNTER — Other Ambulatory Visit: Payer: Self-pay

## 2020-09-25 ENCOUNTER — Inpatient Hospital Stay: Payer: 59

## 2020-09-25 ENCOUNTER — Inpatient Hospital Stay: Payer: 59 | Attending: Oncology | Admitting: Oncology

## 2020-09-25 VITALS — BP 117/83 | HR 69 | Temp 97.4°F | Resp 18 | Ht 65.5 in | Wt 182.5 lb

## 2020-09-25 DIAGNOSIS — Z803 Family history of malignant neoplasm of breast: Secondary | ICD-10-CM | POA: Insufficient documentation

## 2020-09-25 DIAGNOSIS — E063 Autoimmune thyroiditis: Secondary | ICD-10-CM

## 2020-09-25 DIAGNOSIS — Z801 Family history of malignant neoplasm of trachea, bronchus and lung: Secondary | ICD-10-CM | POA: Diagnosis not present

## 2020-09-25 DIAGNOSIS — Z170 Estrogen receptor positive status [ER+]: Secondary | ICD-10-CM

## 2020-09-25 DIAGNOSIS — Z9221 Personal history of antineoplastic chemotherapy: Secondary | ICD-10-CM | POA: Insufficient documentation

## 2020-09-25 DIAGNOSIS — C50412 Malignant neoplasm of upper-outer quadrant of left female breast: Secondary | ICD-10-CM | POA: Diagnosis present

## 2020-09-25 DIAGNOSIS — Z8042 Family history of malignant neoplasm of prostate: Secondary | ICD-10-CM | POA: Diagnosis not present

## 2020-09-25 DIAGNOSIS — M5137 Other intervertebral disc degeneration, lumbosacral region: Secondary | ICD-10-CM

## 2020-09-25 DIAGNOSIS — Z923 Personal history of irradiation: Secondary | ICD-10-CM | POA: Insufficient documentation

## 2020-09-25 DIAGNOSIS — Z79810 Long term (current) use of selective estrogen receptor modulators (SERMs): Secondary | ICD-10-CM | POA: Diagnosis not present

## 2020-09-25 DIAGNOSIS — M8580 Other specified disorders of bone density and structure, unspecified site: Secondary | ICD-10-CM | POA: Diagnosis not present

## 2020-09-25 LAB — CBC WITH DIFFERENTIAL/PLATELET
Abs Immature Granulocytes: 0.01 10*3/uL (ref 0.00–0.07)
Basophils Absolute: 0 10*3/uL (ref 0.0–0.1)
Basophils Relative: 0 %
Eosinophils Absolute: 0.1 10*3/uL (ref 0.0–0.5)
Eosinophils Relative: 1 %
HCT: 38.2 % (ref 36.0–46.0)
Hemoglobin: 11.9 g/dL — ABNORMAL LOW (ref 12.0–15.0)
Immature Granulocytes: 0 %
Lymphocytes Relative: 48 %
Lymphs Abs: 3.3 10*3/uL (ref 0.7–4.0)
MCH: 27.2 pg (ref 26.0–34.0)
MCHC: 31.2 g/dL (ref 30.0–36.0)
MCV: 87.4 fL (ref 80.0–100.0)
Monocytes Absolute: 0.3 10*3/uL (ref 0.1–1.0)
Monocytes Relative: 4 %
Neutro Abs: 3.2 10*3/uL (ref 1.7–7.7)
Neutrophils Relative %: 47 %
Platelets: 258 10*3/uL (ref 150–400)
RBC: 4.37 MIL/uL (ref 3.87–5.11)
RDW: 13.6 % (ref 11.5–15.5)
WBC: 6.9 10*3/uL (ref 4.0–10.5)
nRBC: 0 % (ref 0.0–0.2)

## 2020-09-25 LAB — COMPREHENSIVE METABOLIC PANEL
ALT: 17 U/L (ref 0–44)
AST: 19 U/L (ref 15–41)
Albumin: 3.6 g/dL (ref 3.5–5.0)
Alkaline Phosphatase: 66 U/L (ref 38–126)
Anion gap: 9 (ref 5–15)
BUN: 18 mg/dL (ref 6–20)
CO2: 24 mmol/L (ref 22–32)
Calcium: 8.4 mg/dL — ABNORMAL LOW (ref 8.9–10.3)
Chloride: 110 mmol/L (ref 98–111)
Creatinine, Ser: 1.08 mg/dL — ABNORMAL HIGH (ref 0.44–1.00)
GFR, Estimated: >60 mL/min (ref >60)
Glucose, Bld: 118 mg/dL — ABNORMAL HIGH (ref 70–99)
Potassium: 4.2 mmol/L (ref 3.5–5.1)
Sodium: 143 mmol/L (ref 135–145)
Total Bilirubin: 0.2 mg/dL — ABNORMAL LOW (ref 0.3–1.2)
Total Protein: 6.6 g/dL (ref 6.5–8.1)

## 2020-09-26 ENCOUNTER — Other Ambulatory Visit: Payer: Self-pay | Admitting: Oncology

## 2020-10-29 ENCOUNTER — Emergency Department (HOSPITAL_COMMUNITY)
Admission: EM | Admit: 2020-10-29 | Discharge: 2020-10-29 | Disposition: A | Discharge status: Home / Self Care | Payer: 59 | Attending: Emergency Medicine | Admitting: Emergency Medicine

## 2020-10-29 ENCOUNTER — Other Ambulatory Visit: Payer: Self-pay

## 2020-10-29 ENCOUNTER — Encounter (HOSPITAL_COMMUNITY): Payer: Self-pay | Admitting: *Deleted

## 2020-10-29 ENCOUNTER — Emergency Department (HOSPITAL_COMMUNITY): Payer: 59

## 2020-10-29 DIAGNOSIS — R0602 Shortness of breath: Secondary | ICD-10-CM | POA: Insufficient documentation

## 2020-10-29 DIAGNOSIS — R002 Palpitations: Secondary | ICD-10-CM | POA: Diagnosis present

## 2020-10-29 DIAGNOSIS — Z853 Personal history of malignant neoplasm of breast: Secondary | ICD-10-CM | POA: Insufficient documentation

## 2020-10-29 DIAGNOSIS — Z91040 Latex allergy status: Secondary | ICD-10-CM | POA: Insufficient documentation

## 2020-10-29 DIAGNOSIS — I493 Ventricular premature depolarization: Secondary | ICD-10-CM

## 2020-10-29 DIAGNOSIS — R079 Chest pain, unspecified: Secondary | ICD-10-CM

## 2020-10-29 LAB — CBC
HCT: 40.6 % (ref 36.0–46.0)
Hemoglobin: 13 g/dL (ref 12.0–15.0)
MCH: 27.8 pg (ref 26.0–34.0)
MCHC: 32 g/dL (ref 30.0–36.0)
MCV: 86.8 fL (ref 80.0–100.0)
Platelets: 261 10*3/uL (ref 150–400)
RBC: 4.68 MIL/uL (ref 3.87–5.11)
RDW: 14.3 % (ref 11.5–15.5)
WBC: 8.1 10*3/uL (ref 4.0–10.5)
nRBC: 0 % (ref 0.0–0.2)

## 2020-10-29 LAB — BASIC METABOLIC PANEL
Anion gap: 10 (ref 5–15)
BUN: 18 mg/dL (ref 6–20)
CO2: 27 mmol/L (ref 22–32)
Calcium: 9.6 mg/dL (ref 8.9–10.3)
Chloride: 102 mmol/L (ref 98–111)
Creatinine, Ser: 1.11 mg/dL — ABNORMAL HIGH (ref 0.44–1.00)
GFR, Estimated: 58 mL/min — ABNORMAL LOW (ref >60)
Glucose, Bld: 106 mg/dL — ABNORMAL HIGH (ref 70–99)
Potassium: 4 mmol/L (ref 3.5–5.1)
Sodium: 139 mmol/L (ref 135–145)

## 2020-10-29 LAB — I-STAT BETA HCG BLOOD, ED (MC, WL, AP ONLY): I-stat hCG, quantitative: <5 m[IU]/mL (ref <5)

## 2020-10-29 LAB — TSH: TSH: 1.351 u[IU]/mL (ref 0.350–4.500)

## 2020-10-29 LAB — TROPONIN I (HIGH SENSITIVITY): Troponin I (High Sensitivity): 2 ng/L (ref <18)

## 2020-10-29 LAB — D-DIMER, QUANTITATIVE: D-Dimer, Quant: 0.46 ug/mL-FEU (ref 0.00–0.50)

## 2020-10-29 MED ORDER — ONDANSETRON 4 MG PO TBDP: 4.0000 mg | ORAL_TABLET | Freq: Three times a day (TID) | ORAL | 0 refills | Status: DC | PRN

## 2020-10-29 MED ORDER — ONDANSETRON HCL 4 MG/2ML IJ SOLN
4.0000 mg | Freq: Once | INTRAMUSCULAR | Status: AC
  Administered 2020-10-29: 4 mg via INTRAVENOUS
  Filled 2020-10-29: qty 2

## 2020-10-29 NOTE — ED Triage Notes (Signed)
Pt has been having palpations for a while, last night chest pressure, nausea, nauseated at present with chest pressure continuing.

## 2020-10-29 NOTE — ED Provider Notes (Signed)
[facility] DEPT Provider Note   CSN: [id number] Arrival date & time: 10/29/20  1353     History Chief Complaint  Patient presents with  . Palpitations  . Nausea  . Chest Pain    Ashley Tyler is a 55 y.o. female.  HPI Patient presents with chest pain palpitations some nausea and headache.  States she is been having feelings of palpitations for a while.  Come and go.  Not exertional.  States she has had chest pain today.  Is a pressure in the mid chest.  No radiation to arm.  No diaphoresis.  States she does feel little more short of breath.  Does have previous history of breast cancer although cancer free now as far she knows.  No known cardiac disease.  Had had previous PVCs after thyroiditis thought to be secondary to radiation.  No swelling in her leg.  Does not smoke.  No known cardiac history.  Chest pain is been on and off all day.    Past Medical History:  Diagnosis Date  . Cancer ([place])    left breast  . Chronic constipation   . Endometriosis   . Family history of breast cancer   . Family history of prostate cancer   . Headache   . History of radiation therapy 01/21/16- 03/03/16   Left breast/ 50 Gy in 25 fractions. Left Breast Boost/ 10 Gy in 5 Fractions.  . Migraines   . Personal history of chemotherapy   . Personal history of radiation therapy     Patient Active Problem List   Diagnosis Date Noted  . DDD (degenerative disc disease), lumbosacral 10/14/2017  . Thyromegaly 01/19/2017  . 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 Genetic testing 10/30/2015  . Dehydration 10/19/2015  . Neoplasm related pain 10/19/2015  . Family history of breast cancer   . Family history of prostate cancer   . Malignant neoplasm of upper-outer quadrant of left breast in female, estrogen receptor positive ([facility]) 09/13/2015    Past Surgical History:  Procedure Laterality Date  . BREAST LUMPECTOMY Left  08/2015  . BREAST LUMPECTOMY WITH RADIOACTIVE SEED AND SENTINEL LYMPH NODE BIOPSY Left 08/28/2015   Procedure: LEFT BREAST LUMPECTOMY WITH RADIOACTIVE SEED AND SENTINEL LYMPH NODE BIOPSY;  Surgeon: [name], MD;  Location: [facility];  Service: General;  Laterality: Left;  . CESAREAN SECTION    . EXCISION / BIOPSY BREAST / NIPPLE / DUCT Left 08/28/2015  . LAPAROSCOPIC SALPINGO OOPHERECTOMY Right   . TONSILLECTOMY       OB History   No obstetric history on file.     Family History  Problem Relation Age of Onset  . Prostate cancer Father 79       Gleason = 7  . Prostate cancer Maternal Uncle        dx in his 24s  . Breast cancer Paternal Aunt        dx in her 19s  . Prostate cancer Paternal Uncle   . Alzheimer's disease Maternal Grandmother   . Lung cancer Maternal Grandfather   . Congestive Heart Failure Paternal Grandmother   . Prostate cancer Maternal Uncle        dx in his 70s  . Breast cancer Paternal Aunt        dx in her 56s; maternal half paternal aunt  . Prostate cancer Paternal Uncle        dx in his 43s  . Breast cancer Cousin   .  Breast cancer Cousin        father's maternal cousin's daughter; dx in her 42s-30s    Social History   Tobacco Use  . Smoking status: Never Smoker  . Smokeless tobacco: Never Used  Substance Use Topics  . Alcohol use: Yes    Comment: social  . Drug use: No    Home Medications Prior to Admission medications   Medication Sig Start Date End Date Taking? Authorizing Provider  fluticasone (FLONASE) 50 MCG/ACT nasal spray Place 1 spray into both nostrils daily as needed for allergies or rhinitis.   Yes [provider]  ondansetron (ZOFRAN-ODT) 4 MG disintegrating tablet Take 1 tablet (4 mg total) by mouth every 8 (eight) hours as needed for nausea or vomiting. 10/29/20  Yes Davonna Belling, MD  tamoxifen (NOLVADEX) 20 MG tablet TAKE 1 TABLET BY MOUTH  DAILY 04/27/20  Yes Magrinat, Virgie Dad, MD   Calcium Carbonate-Vit D-Min (CALTRATE 600+D PLUS MINERALS) 600-800 MG-UNIT TABS Take 1 tablet by mouth daily. Patient not taking: Reported on 10/29/2020 09/26/19   Magrinat, Virgie Dad, MD  cholecalciferol (VITAMIN D3) 25 MCG (1000 UNIT) tablet Take 1 tablet (1,000 Units total) by mouth daily. Patient not taking: Reported on 10/29/2020 09/26/19   Magrinat, Virgie Dad, MD  cloNIDine (CATAPRES-TTS-1) 0.1 mg/24hr patch Place 1 patch (0.1 mg total) onto the skin once a week. Patient not taking: Reported on 10/29/2020 09/26/19   Magrinat, Virgie Dad, MD    Allergies    Tetracycline, Gabapentin, Tetracycline hcl, Tetracyclines & related, Venlafaxine hcl, Latex, and Other  Review of Systems   Review of Systems  Constitutional: Negative for appetite change and fever.  HENT: Negative for dental problem.   Respiratory: Positive for shortness of breath.   Cardiovascular: Positive for chest pain and palpitations.  Gastrointestinal: Positive for nausea.  Genitourinary: Negative for dysuria.  Musculoskeletal: Negative for back pain.  Skin: Negative for rash.  Psychiatric/Behavioral: Negative for confusion.    Physical Exam Updated Vital Signs BP 106/69 (BP Location: Right Arm)   Pulse 100   Resp 20   Ht 5\' 5"  (1.651 m)   Wt 81.6 kg   SpO2 99%   BMI 29.95 kg/m   Physical Exam Nursing note reviewed.  HENT:     Head: Atraumatic.  Eyes:     Pupils: Pupils are equal, round, and reactive to light.  Cardiovascular:     Rate and Rhythm: Normal rate and regular rhythm.  Pulmonary:     Breath sounds: No wheezing or rhonchi.  Chest:     Chest wall: No tenderness.  Abdominal:     Tenderness: There is no abdominal tenderness.  Musculoskeletal:     Cervical back: Neck supple.     Right lower leg: No edema.     Left lower leg: No edema.  Skin:    General: Skin is warm.  Neurological:     Mental Status: She is alert and oriented to person, place, and time.     ED Results / Procedures / Treatments    Labs (all labs ordered are listed, but only abnormal results are displayed) Labs Reviewed  BASIC METABOLIC PANEL - Abnormal; Notable for the following components:      Result Value   Glucose, Bld 106 (*)    Creatinine, Ser 1.11 (*)    GFR, Estimated 58 (*)    All other components within normal limits  RESP PANEL BY RT-PCR (FLU A&B, COVID) ARPGX2  CBC  D-DIMER, QUANTITATIVE  TSH  I-STAT BETA HCG BLOOD, ED ([facility], [facility], [facility] ONLY)  TROPONIN I (HIGH SENSITIVITY)    EKG EKG Interpretation  Date/Time:  Monday Oct 29 2020 14:03:40 EDT Ventricular Rate:  90 PR Interval:  131 QRS Duration: 83 QT Interval:  361 QTC Calculation: 442 R Axis:   42 Text Interpretation: Sinus rhythm Confirmed by Davonna Belling [id number]) on 10/29/2020 2:37:03 PM   Radiology DG Chest 2 View  Result Date: 10/29/2020 CLINICAL DATA:  Pt has been having palpations for a while, last night chest pressure, nausea, nauseated at present with chest pressure continuing. EXAM: CHEST - 2 VIEW COMPARISON:  None. FINDINGS: Normal heart, mediastinum and hila. Clear lungs.  No pleural effusion or pneumothorax. Skeletal structures are unremarkable. Changes from previous left breast surgery. IMPRESSION: No active cardiopulmonary disease. Electronically Signed   By: Lajean Manes M.D.   On: 10/29/2020 14:45    Procedures Procedures   Medications Ordered in ED Medications  ondansetron (ZOFRAN) injection 4 mg (4 mg Intravenous Given 10/29/20 1501)    ED Course  I have reviewed the triage vital signs and the nursing notes.  Pertinent labs & imaging results that were available during my care of the patient were reviewed by me and considered in my medical decision making (see chart for details).    MDM Rules/Calculators/A&P                          Patient with palpitations chest pain.  Few PVCs seen on monitor and states that does correlate with what she is feeling the palpitations.Lab work reassuring.  Troponin negative.  I  think cardiac pain while she is low enough risk she can be ruled out with 1 troponin.  Pain has been going all day.  Also D-dimer done due to cancer history.  It was negative.  Chest x-ray reassuring.  Discharged home with outpatient follow-up. Final Clinical Impression(s) / ED Diagnoses Final diagnoses:  Palpitations  PVC's (premature ventricular contractions)  Nonspecific chest pain    Rx / DC Orders ED Discharge Orders         Ordered    ondansetron (ZOFRAN-ODT) 4 MG disintegrating tablet  Every 8 hours PRN        10/29/20 1555           Davonna Belling, MD 10/29/20 1656

## 2020-10-29 NOTE — ED Notes (Signed)
Tolerated ambulating to the bathroom well.  No co chest pain

## 2021-03-07 DIAGNOSIS — M76829 Posterior tibial tendinitis, unspecified leg: Secondary | ICD-10-CM | POA: Insufficient documentation

## 2021-07-04 ENCOUNTER — Other Ambulatory Visit: Payer: Self-pay | Admitting: Internal Medicine

## 2021-07-04 DIAGNOSIS — Z1231 Encounter for screening mammogram for malignant neoplasm of breast: Secondary | ICD-10-CM

## 2021-08-26 ENCOUNTER — Ambulatory Visit
Admission: RE | Admit: 2021-08-26 | Discharge: 2021-08-26 | Disposition: A | Discharge status: Home / Self Care | Payer: 59 | Source: Ambulatory Visit | Attending: Internal Medicine | Admitting: Internal Medicine

## 2021-08-26 DIAGNOSIS — Z1231 Encounter for screening mammogram for malignant neoplasm of breast: Secondary | ICD-10-CM

## 2021-08-29 ENCOUNTER — Ambulatory Visit (INDEPENDENT_AMBULATORY_CARE_PROVIDER_SITE_OTHER): Payer: 59 | Admitting: Podiatry

## 2021-08-29 ENCOUNTER — Encounter: Payer: Self-pay | Admitting: Podiatry

## 2021-08-29 ENCOUNTER — Ambulatory Visit (INDEPENDENT_AMBULATORY_CARE_PROVIDER_SITE_OTHER): Payer: 59

## 2021-08-29 ENCOUNTER — Other Ambulatory Visit: Payer: Self-pay | Admitting: Podiatry

## 2021-08-29 DIAGNOSIS — M722 Plantar fascial fibromatosis: Secondary | ICD-10-CM

## 2021-08-29 DIAGNOSIS — F3281 Premenstrual dysphoric disorder: Secondary | ICD-10-CM | POA: Insufficient documentation

## 2021-08-29 DIAGNOSIS — M7662 Achilles tendinitis, left leg: Secondary | ICD-10-CM | POA: Diagnosis not present

## 2021-08-29 DIAGNOSIS — M76822 Posterior tibial tendinitis, left leg: Secondary | ICD-10-CM | POA: Diagnosis not present

## 2021-08-29 DIAGNOSIS — M778 Other enthesopathies, not elsewhere classified: Secondary | ICD-10-CM

## 2021-08-29 DIAGNOSIS — J320 Chronic maxillary sinusitis: Secondary | ICD-10-CM | POA: Insufficient documentation

## 2021-08-29 DIAGNOSIS — R4586 Emotional lability: Secondary | ICD-10-CM | POA: Insufficient documentation

## 2021-08-29 DIAGNOSIS — K5900 Constipation, unspecified: Secondary | ICD-10-CM | POA: Insufficient documentation

## 2021-08-29 DIAGNOSIS — L709 Acne, unspecified: Secondary | ICD-10-CM | POA: Insufficient documentation

## 2021-08-29 DIAGNOSIS — N809 Endometriosis, unspecified: Secondary | ICD-10-CM | POA: Insufficient documentation

## 2021-08-29 MED ORDER — MELOXICAM 15 MG PO TABS: 15.0000 mg | ORAL_TABLET | Freq: Every day | ORAL | 3 refills | Status: AC

## 2021-08-29 MED ORDER — TRIAMCINOLONE ACETONIDE 40 MG/ML IJ SUSP
20.0000 mg | Freq: Once | INTRAMUSCULAR | Status: AC
  Administered 2021-08-29: 20 mg

## 2021-08-29 MED ORDER — METHYLPREDNISOLONE 4 MG PO TBPK: ORAL_TABLET | ORAL | 0 refills | Status: AC

## 2021-08-29 NOTE — Progress Notes (Signed)
?Subjective:  ?Patient ID: Ashley Tyler, female    DOB: 1965/09/26,  MRN: [id number] ?HPI ?Chief Complaint  ?Patient presents with  ? Ankle Pain  ?  Medial ankle left - aching, swelling, weakness x 1 year, worsening, tried ice, elevation and high top shoes  ? New Patient (Initial Visit)  ? ? ?56 y.o. female presents with the above complaint.  ? ?ROS: Denies fever chills nausea vomiting muscle aches pains calf pain back pain chest pain shortness of breath. ? ?Past Medical History:  ?Diagnosis Date  ? Cancer [facility])   ? left breast  ? Chronic constipation   ? Endometriosis   ? Family history of breast cancer   ? Family history of prostate cancer   ? Headache   ? History of radiation therapy 01/21/16- 03/03/16  ? Left breast/ 50 Gy in 25 fractions. Left Breast Boost/ 10 Gy in 5 Fractions.  ? Migraines   ? Personal history of chemotherapy   ? Personal history of radiation therapy   ? ?Past Surgical History:  ?Procedure Laterality Date  ? BREAST LUMPECTOMY Left 08/2015  ? BREAST LUMPECTOMY WITH RADIOACTIVE SEED AND SENTINEL LYMPH NODE BIOPSY Left 08/28/2015  ? Procedure: LEFT BREAST LUMPECTOMY WITH RADIOACTIVE SEED AND SENTINEL LYMPH NODE BIOPSY;  Surgeon: [name], MD;  Location: [facility];  Service: General;  Laterality: Left;  ? CESAREAN SECTION    ? EXCISION / BIOPSY BREAST / NIPPLE / DUCT Left 08/28/2015  ? LAPAROSCOPIC SALPINGO OOPHERECTOMY Right   ? TONSILLECTOMY    ? ? ?Current Outpatient Medications:  ?  meloxicam (MOBIC) 15 MG tablet, Take 1 tablet (15 mg total) by mouth daily., Disp: 30 tablet, Rfl: 3 ?  methylPREDNISolone (MEDROL DOSEPAK) 4 MG TBPK tablet, 6 day dose pack - take as directed, Disp: 21 tablet, Rfl: 0 ? ?Allergies  ?Allergen Reactions  ? Tetracycline Itching, Nausea And Vomiting and Other (See Comments)  ? Gabapentin Nausea Only  ? Tetracycline Hcl Itching, Nausea And Vomiting and Other (See Comments)  ?  Caused stomach upset, also  ? Tetracyclines & Related  Itching, Nausea And Vomiting and Other (See Comments)  ?  Caused stomach upset, also  ? Venlafaxine Hcl Nausea Only  ? Latex Rash and Other (See Comments)  ?  "Powdered gloves" cause rashes  ? Other Rash and Other (See Comments)  ?  Powder in gloves causes rashes  ? ?Review of Systems ?Objective:  ?There were no vitals filed for this visit. ? ?General: Well developed, nourished, in no acute distress, alert and oriented x3  ? ?Dermatological: Skin is warm, dry and supple bilateral. Nails x 10 are well maintained; remaining integument appears unremarkable at this time. There are no open sores, no preulcerative lesions, no rash or signs of infection present. ? ?Vascular: Dorsalis Pedis artery and Posterior Tibial artery pedal pulses are 2/4 bilateral with immedate capillary fill time. Pedal hair growth present. No varicosities and no lower extremity edema present bilateral.  ? ?Neruologic: Grossly intact via light touch bilateral. Vibratory intact via tuning fork bilateral. Protective threshold with Semmes Wienstein monofilament intact to all pedal sites bilateral. Patellar and Achilles deep tendon reflexes 2+ bilateral. No Babinski or clonus noted bilateral.  ? ?Musculoskeletal: No gross boney pedal deformities bilateral. No pain, crepitus, or limitation noted with foot and ankle range of motion bilateral. Muscular strength 5/5 in all groups tested bilateral.  Pain on palpation of the tibialis anterior posterior tibial tendon all at their insertion.  Some tenderness overlying  the fourth fifth tarsometatarsal joint primary area of tenderness is the plantar fascia continue insertion site. ? ?Gait: Unassisted, Nonantalgic.  ? ? ?Radiographs: ? ?Mild pes planus otherwise normal osseous architecture soft tissue increase in density plantar fascial Caney insertion site no acute findings. ? ?Assessment & Plan:  ? ?Assessment: Planter fasciitis with compensatory syndrome including tibialis anterior tendinitis posterior tibial  tendinitis and capsulitis of the fourth fifth TMT joint. ? ?Plan: Discussed etiology pathology conservative versus surgical therapies at this point I injected her left heel 20 mg Kenalog 5 mg Marcaine point maximal tenderness.  Started her on a Medrol Dosepak to be followed by meloxicam.  Placed on plantar fascia brace.  Night splint discussed appropriate shoe gear stretching exercise ice therapy sugar modifications follow-up with me in 1 month ? ? ? ? ?Johnnisha Forton [address]. [place], DPM ?

## 2021-10-15 ENCOUNTER — Ambulatory Visit: Payer: 59 | Admitting: Podiatry

## 2022-06-12 ENCOUNTER — Other Ambulatory Visit: Payer: Self-pay | Admitting: Family Medicine

## 2022-06-12 DIAGNOSIS — Z1231 Encounter for screening mammogram for malignant neoplasm of breast: Secondary | ICD-10-CM

## 2022-07-22 ENCOUNTER — Other Ambulatory Visit: Payer: Self-pay | Admitting: Nurse Practitioner

## 2022-07-22 DIAGNOSIS — N644 Mastodynia: Secondary | ICD-10-CM

## 2022-07-24 ENCOUNTER — Other Ambulatory Visit: Payer: Self-pay | Admitting: Family Medicine

## 2022-07-24 ENCOUNTER — Ambulatory Visit
Admission: RE | Admit: 2022-07-24 | Discharge: 2022-07-24 | Disposition: A | Discharge status: Home / Self Care | Payer: 59 | Source: Ambulatory Visit | Attending: Nurse Practitioner | Admitting: Nurse Practitioner

## 2022-07-24 ENCOUNTER — Other Ambulatory Visit: Payer: Self-pay | Admitting: Nurse Practitioner

## 2022-07-24 DIAGNOSIS — R921 Mammographic calcification found on diagnostic imaging of breast: Secondary | ICD-10-CM

## 2022-07-24 DIAGNOSIS — N644 Mastodynia: Secondary | ICD-10-CM

## 2022-08-01 ENCOUNTER — Ambulatory Visit
Admission: RE | Admit: 2022-08-01 | Discharge: 2022-08-01 | Disposition: A | Discharge status: Home / Self Care | Payer: 59 | Source: Ambulatory Visit | Attending: Nurse Practitioner | Admitting: Nurse Practitioner

## 2022-08-01 DIAGNOSIS — R921 Mammographic calcification found on diagnostic imaging of breast: Secondary | ICD-10-CM

## 2022-08-01 HISTORY — PX: BREAST BIOPSY: SHX20

## 2023-06-29 ENCOUNTER — Encounter: Payer: Self-pay | Admitting: Family Medicine

## 2023-06-29 DIAGNOSIS — Z0000 Encounter for general adult medical examination without abnormal findings: Secondary | ICD-10-CM

## 2023-07-20 ENCOUNTER — Other Ambulatory Visit: Payer: Self-pay | Admitting: Family Medicine

## 2023-07-20 DIAGNOSIS — Z1231 Encounter for screening mammogram for malignant neoplasm of breast: Secondary | ICD-10-CM

## 2023-07-28 ENCOUNTER — Ambulatory Visit
Admission: RE | Admit: 2023-07-28 | Discharge: 2023-07-28 | Disposition: A | Discharge status: Home / Self Care | Payer: 59 | Source: Ambulatory Visit | Attending: Family Medicine | Admitting: Family Medicine

## 2023-07-28 DIAGNOSIS — Z1231 Encounter for screening mammogram for malignant neoplasm of breast: Secondary | ICD-10-CM

## 2024-06-14 ENCOUNTER — Other Ambulatory Visit: Payer: Self-pay | Admitting: Gastroenterology

## 2024-06-14 DIAGNOSIS — R748 Abnormal levels of other serum enzymes: Secondary | ICD-10-CM

## 2024-06-28 ENCOUNTER — Other Ambulatory Visit: Payer: Self-pay | Admitting: Family Medicine

## 2024-06-28 DIAGNOSIS — Z1231 Encounter for screening mammogram for malignant neoplasm of breast: Secondary | ICD-10-CM

## 2024-07-01 ENCOUNTER — Ambulatory Visit
Admission: RE | Admit: 2024-07-01 | Discharge: 2024-07-01 | Disposition: A | Source: Ambulatory Visit | Attending: Gastroenterology | Admitting: Gastroenterology

## 2024-07-01 DIAGNOSIS — R748 Abnormal levels of other serum enzymes: Secondary | ICD-10-CM

## 2024-07-29 ENCOUNTER — Ambulatory Visit
